# Patient Record
Sex: Female | Born: 1980 | Race: White | Hispanic: No | Marital: Single | Smoking: Current every day smoker
Health system: Southern US, Community
[De-identification: ages and names within clinical notes are randomized; demographics above are authoritative.]

## PROBLEM LIST (undated history)

## (undated) DIAGNOSIS — F4310 Post-traumatic stress disorder, unspecified: Secondary | ICD-10-CM

## (undated) DIAGNOSIS — F32A Depression, unspecified: Secondary | ICD-10-CM

## (undated) DIAGNOSIS — F419 Anxiety disorder, unspecified: Secondary | ICD-10-CM

## (undated) DIAGNOSIS — G43909 Migraine, unspecified, not intractable, without status migrainosus: Secondary | ICD-10-CM

## (undated) DIAGNOSIS — F329 Major depressive disorder, single episode, unspecified: Secondary | ICD-10-CM

## (undated) HISTORY — PX: BUNIONECTOMY: SHX129

## (undated) HISTORY — PX: CYST EXCISION: SHX5701

## (undated) HISTORY — DX: Depression, unspecified: F32.A

## (undated) HISTORY — DX: Anxiety disorder, unspecified: F41.9

## (undated) HISTORY — DX: Migraine, unspecified, not intractable, without status migrainosus: G43.909

## (undated) HISTORY — DX: Major depressive disorder, single episode, unspecified: F32.9

---

## 2004-11-12 ENCOUNTER — Emergency Department: Payer: Self-pay | Admitting: Emergency Medicine

## 2005-10-31 ENCOUNTER — Inpatient Hospital Stay: Payer: Self-pay | Admitting: Psychiatry

## 2013-09-07 ENCOUNTER — Ambulatory Visit: Payer: Self-pay | Admitting: Podiatrist

## 2013-09-15 ENCOUNTER — Ambulatory Visit (INDEPENDENT_AMBULATORY_CARE_PROVIDER_SITE_OTHER): Payer: BC Managed Care – PPO | Admitting: Podiatrist

## 2013-09-15 ENCOUNTER — Ambulatory Visit (INDEPENDENT_AMBULATORY_CARE_PROVIDER_SITE_OTHER): Payer: BC Managed Care – PPO

## 2013-09-15 ENCOUNTER — Encounter: Payer: Self-pay | Admitting: Podiatrist

## 2013-09-15 VITALS — BP 104/60 | HR 81 | Resp 16 | Ht 64.0 in | Wt 135.0 lb

## 2013-09-15 DIAGNOSIS — M79609 Pain in unspecified limb: Secondary | ICD-10-CM

## 2013-09-15 DIAGNOSIS — B351 Tinea unguium: Secondary | ICD-10-CM

## 2013-09-15 DIAGNOSIS — M79673 Pain in unspecified foot: Secondary | ICD-10-CM

## 2013-09-15 DIAGNOSIS — M2010 Hallux valgus (acquired), unspecified foot: Secondary | ICD-10-CM

## 2013-09-15 DIAGNOSIS — Z79899 Other long term (current) drug therapy: Secondary | ICD-10-CM

## 2013-09-15 NOTE — Patient Instructions (Signed)
Pre-Operative Instructions  Congratulations, you have decided to take an important step to improving your quality of life.  You can be assured that the doctors of [facility] will be with you every step of the way.  1. Plan to be at the surgery center/hospital at least 1 (one) hour prior to your scheduled time unless otherwise directed by the surgical center/hospital staff.  You must have a responsible adult accompany you, remain during the surgery and drive you home.  Make sure you have directions to the surgical center/hospital and know how to get there on time. 2. For hospital based surgery you will need to obtain a history and physical form from your family physician within 1 month prior to the date of surgery- we will give you a form for you primary physician.  3. We make every effort to accommodate the date you request for surgery.  There are however, times where surgery dates or times have to be moved.  We will contact you as soon as possible if a change in schedule is required.   4. No Aspirin/Ibuprofen for one week before surgery.  If you are on aspirin, any non-steroidal anti-inflammatory medications (Mobic, Aleve, Ibuprofen) you should stop taking it 7 days prior to your surgery.  You make take Tylenol  For pain prior to surgery.  5. Medications- If you are taking daily heart and blood pressure medications, seizure, reflux, allergy, asthma, anxiety, pain or diabetes medications, make sure the surgery center/hospital is aware before the day of surgery so they may notify you which medications to take or avoid the day of surgery. 6. No food or drink after midnight the night before surgery unless directed otherwise by surgical center/hospital staff. 7. No alcoholic beverages 24 hours prior to surgery.  No smoking 24 hours prior to or 24 hours after surgery. 8. Wear loose pants or shorts- loose enough to fit over bandages, boots, and casts. 9. No slip on shoes, sneakers are best. 10. Bring  your boot with you to the surgery center/hospital.  Also bring crutches or a walker if your physician has prescribed it for you.  If you do not have this equipment, it will be provided for you after surgery. 11. If you have not been contracted by the surgery center/hospital by the day before your surgery, call to confirm the date and time of your surgery. 12. Leave-time from work may vary depending on the type of surgery you have.  Appropriate arrangements should be made prior to surgery with your employer. 13. Prescriptions will be provided immediately following surgery by your doctor.  Have these filled as soon as possible after surgery and take the medication as directed. 14. Remove nail polish on the operative foot. 15. Wash the night before surgery.  The night before surgery wash the foot and leg well with the antibacterial soap provided and water paying special attention to beneath the toenails and in between the toes.  Rinse thoroughly with water and dry well with a towel.  Perform this wash unless told not to do so by your physician.  Enclosed: 1 Ice pack (please put in freezer the night before surgery)   1 Hibiclens skin cleaner   Pre-op Instructions  If you have any questions regarding the instructions, do not hesitate to call our office.  [place]: [address] [place], [place] [address] [phone]  [place]: [address] [place], [place] [address] [phone]  [place]: [address]  [place], [place] [address] [phone]  Dr. Richard   Tuchman DPM, Dr. Norman Regal DPM Dr. Richard Sikora DPM, Dr. M. Todd Hyatt DPM, Dr. Kathryn Egerton DPM 

## 2013-09-15 NOTE — Progress Notes (Signed)
Subjective:    Patient ID: Lori RifeSamantha G Roussin, female    DOB: 09/24/1980, 33 y.o.   MRN: [id number]  Patient presents today with chief complaint of a bunion on her right foot. She states has progressively gotten worse and it's uncomfortable and shoe gear. She also relates it's uncomfortable with dorsiflexion. She's tried putting padding between the great toe and second toe with minimal relief in symptoms. she states she's noticed it worsening over the past year. She's tried shoe gear changes as well.she also has a yellowish discoloration to her toenails of which she is concerned may be a fungus. She wants to know if this can be treated as well.   HPI Comments: N pain L right foot bunion D 1 year O slowly C worse A standing, walking T ice, gauze in shoes  Foot Pain      Review of Systems  Constitutional: Positive for activity change.  HENT: Negative.   Eyes: Negative.   Respiratory: Negative.   Cardiovascular: Negative.   Gastrointestinal: Negative.   Endocrine: Negative.   Genitourinary: Negative.   Musculoskeletal:       Difficulty walking  Skin: Negative.   Allergic/Immunologic: Negative.   Neurological: Negative.   Hematological: Negative.   Psychiatric/Behavioral: Negative.        Objective:   Physical Exam GENERAL APPEARANCE: Alert, conversant. Appropriately groomed. No acute distress.  VASCULAR: Pedal pulses palpable at 2/4 DP and PT bilateral.  Capillary refill time is immediate to all digits,  Proximal to distal cooling it warm to warm.  Digital hair growth is present bilateral  NEUROLOGIC: sensation is intact epicritically and protectively to 5.07 monofilament at 5/5 sites bilateral.  Light touch is intact bilateral, vibratory sensation intact bilateral, achilles tendon reflex is intact bilateral.  MUSCULOSKELETAL: acceptable muscle strength, tone and stability bilateral.  Deformity is noted right. Lateral deviation of the hallux is also seen right. Good range  of motion of the first metatarsophalangeal joint is noted but with pain and range of motion. Dorsiflexion 60 plantar flexion 20. Rectus appearance of foot otherwise is seen. DERMATOLOGIC:  toenails are yellowish brownish discoloration with subungual debris present especially bilateral first. Multiple signs of mycotic infection present. skin color, texture, and turger are otherwise within normal limits.    X.-Rays reveal a bunion deformity right. Mild increased IM angle is noted. See complete x-ray report.     Assessment & Plan:  Hallux abductovalgus deformity right. Mycotic toenail    Plan: Discussed conservative versus surgical options. Recommended a Austin bunion correction with possible Akin osteotomy of foot. The consent form was discussed and all three pages were signed and the patient's questions were encouraged and answered to the best of my ability. Risks of the surgery were discussed including but not limited to continued pain, infection, swelling, elevated toe, decreased range of motion,  Or suture or implant reaction. Preoperative instructions were also dispensed to the patient as well as a preoperative surgical pamphlet to go along with the instructions. Surgery will be scheduled at the patients convenience and patient [facility] on outpatient basis. If  any questions or concerns prior to her surgical date She is instructed to call. [facility]. I will notify her of the result. We will start her on Lamisil with receipt of fungal culture. Liver function panel and CBC forms were dispensed to the patient today and she'll be instructed to get that done before  starting the Lamisil.

## 2013-09-15 NOTE — Progress Notes (Deleted)
Bun right-- Lori Miller  Nail fungus-- today took sample-- will send lab form into lab and rx at receipt of nail sample

## 2013-09-22 ENCOUNTER — Ambulatory Visit: Payer: BC Managed Care – PPO | Admitting: Podiatrist

## 2013-10-12 ENCOUNTER — Encounter: Payer: Self-pay | Admitting: Podiatrist

## 2013-10-14 DIAGNOSIS — M79609 Pain in unspecified limb: Secondary | ICD-10-CM

## 2013-10-19 DIAGNOSIS — M2010 Hallux valgus (acquired), unspecified foot: Secondary | ICD-10-CM

## 2013-10-20 ENCOUNTER — Ambulatory Visit: Payer: Self-pay | Admitting: Podiatrist

## 2013-10-20 ENCOUNTER — Other Ambulatory Visit: Payer: Self-pay | Admitting: Podiatrist

## 2013-10-20 ENCOUNTER — Other Ambulatory Visit: Payer: Self-pay | Admitting: *Deleted

## 2013-10-20 MED ORDER — OXYCODONE-ACETAMINOPHEN 10-300 MG PO TABS: 1.0000 | ORAL_TABLET | ORAL | Status: DC | PRN

## 2013-10-21 ENCOUNTER — Other Ambulatory Visit: Payer: Self-pay | Admitting: *Deleted

## 2013-10-21 MED ORDER — OXYCODONE-ACETAMINOPHEN 10-325 MG PO TABS: ORAL_TABLET | ORAL | Status: DC

## 2013-10-21 NOTE — Telephone Encounter (Signed)
Pt called said pharmacy told her roxycodone was no longer being made. Per dr egerton have dr regal sign off on percocet 10/325. Percocet written for 10/325 mg #30 0 refills. Pt will come by office and pick up rx.

## 2013-10-27 ENCOUNTER — Ambulatory Visit (INDEPENDENT_AMBULATORY_CARE_PROVIDER_SITE_OTHER): Payer: BC Managed Care – PPO | Admitting: Podiatrist

## 2013-10-27 ENCOUNTER — Ambulatory Visit (INDEPENDENT_AMBULATORY_CARE_PROVIDER_SITE_OTHER): Payer: BC Managed Care – PPO

## 2013-10-27 VITALS — BP 108/61 | HR 68 | Resp 16 | Ht 64.0 in | Wt 135.0 lb

## 2013-10-27 DIAGNOSIS — Z9889 Other specified postprocedural states: Secondary | ICD-10-CM

## 2013-10-27 DIAGNOSIS — M2010 Hallux valgus (acquired), unspecified foot: Secondary | ICD-10-CM

## 2013-10-27 MED ORDER — OXYCODONE-ACETAMINOPHEN 10-325 MG PO TABS: ORAL_TABLET | ORAL | Status: DC

## 2013-10-27 NOTE — Patient Instructions (Signed)
Ok to start getting your foot wet-- wear your darco shoe now instead of your boot.  Start to exercise your big toe joint by moving your toe up and down with your fingers.

## 2013-10-27 NOTE — Progress Notes (Signed)
   Subjective: Patient presents today2 weeks status post foot surgery of the right foot.  Date of surgery 10/12/2013. The patient was unable to be seen last week as she had a stomach virus that kept her sick for the entire week. She states that she's been off her foot and wearing her boot as instructed. Today she denies nausea, vomiting, fevers, chills or night sweats.  Denies calf pain or tenderness to the operative side.  Objective:  Neurovascular status is intact with palpable pedal pulses DP and PT bilateral at 2+ out of 4. Neurological sensation is intact and unchanged as per prior to surgery. Excellent appearance of the postoperative foot is noted. Mild swelling at the metatarsophalangeal joint is present. Overall excellent appearance of the foot is seen postoperatively both clinically and radiographically. Range of motion of the first metatarsophalangeal joint is excellent.  Assessment: Status post bunion correction right foot date of surgery 10/12/2013  Plan:  Sutures are removed at today's visit. Darco she was dispensed as well as an anklet. Refilled her prescription for Percocet 10/325. I will see her back in 2 weeks for her one-month postop followup. Range of motion is excellent but she is encouraged to continue range of motion exercises. If she has any problems or concerns she will call.

## 2013-11-10 ENCOUNTER — Other Ambulatory Visit: Payer: Self-pay | Admitting: *Deleted

## 2013-11-10 MED ORDER — OXYCODONE-ACETAMINOPHEN 10-325 MG PO TABS: ORAL_TABLET | ORAL | Status: DC

## 2013-11-10 NOTE — Telephone Encounter (Signed)
Pt called requesting more pain medicine. Per dr Al Corpushyatt she can have #30 more. Percocet filled.

## 2013-11-16 NOTE — Progress Notes (Signed)
Dr Irving ShowsEgerton ordered Percocet 5/325mg  #40 1 or 2 tablets every 4 to 6 hours prn pain, and Keflex 500mg  #21 1 capsule tid.

## 2013-11-17 ENCOUNTER — Ambulatory Visit (INDEPENDENT_AMBULATORY_CARE_PROVIDER_SITE_OTHER): Payer: BC Managed Care – PPO

## 2013-11-17 ENCOUNTER — Ambulatory Visit (INDEPENDENT_AMBULATORY_CARE_PROVIDER_SITE_OTHER): Payer: BC Managed Care – PPO | Admitting: Podiatrist

## 2013-11-17 VITALS — Resp 16 | Ht 64.0 in | Wt 140.0 lb

## 2013-11-17 DIAGNOSIS — Z9889 Other specified postprocedural states: Secondary | ICD-10-CM

## 2013-11-17 DIAGNOSIS — M2010 Hallux valgus (acquired), unspecified foot: Secondary | ICD-10-CM

## 2013-11-17 MED ORDER — CYCLOBENZAPRINE HCL 5 MG PO TABS: 5.0000 mg | ORAL_TABLET | Freq: Every day | ORAL | Status: DC

## 2013-11-17 MED ORDER — OXYCODONE-ACETAMINOPHEN 5-325 MG PO TABS: 1.0000 | ORAL_TABLET | ORAL | Status: DC | PRN

## 2013-11-17 NOTE — Progress Notes (Signed)
Subjective: Lori Miller presents today for her postoperative followup status post right bunion surgery. She states that she's overall doing well but her dog ate her Darco shoe. She has been wearing her Croc shoes. She is 1 month postoperative. She denies any local or systemic signs of infection.  Objective: Excellent appearance of the foot is seen. Neurovascular status is intact. Range of motion is limited. Patient is guarding the first metatarsophalangeal joint. Incision line is excellent with full healing of the incision seen. No redness,  no drainage, no signs of infection are present. X-rays show a healing osteotomy of the right  Assessment: Status post bunion procedure right  Plan: Recommended continued range of motion exercises. Recommended that she start to practice getting into her work boot as she is supposed to go back to work in a week. If she is unable to return to work she will call and I will put her out for 2 more weeks to get the foot ready for work. She states she's cramping and I wrote her prescription for Flexeril. Also wrote a prescription for oxycodone to take at night for pain. She will be seen back in one month for her followup.

## 2013-11-24 ENCOUNTER — Telehealth: Payer: Self-pay | Admitting: *Deleted

## 2013-11-24 NOTE — Telephone Encounter (Signed)
Pt called said she can only put a shoe on for 5 - 10 minutes and she is supposed to be going back to work on 4.20.15. Told pt she could stay out of work longer and return on Monday 5.4.15. Pt stated something would need to be faxed to short term disability for extension. Told pt shelly would give her a call to find out more information on what needed to be faxed. Pt understood.

## 2013-11-30 ENCOUNTER — Telehealth: Payer: Self-pay | Admitting: *Deleted

## 2013-11-30 ENCOUNTER — Other Ambulatory Visit: Payer: Self-pay | Admitting: *Deleted

## 2013-11-30 NOTE — Telephone Encounter (Signed)
PT CALLED SAID SHE HAS TO GO BACK TO WORK ON Sunday 4.26.15. SHE WAS NOT APPROVED TO STAY OUT LONGER. SAYS SHE IS HAVING A LOT OF PAIN ON THE SIDE OF HER RT FOOT WHERE HER BUNION WAS AND HER FOOT IS SWOLLEN. SHE IS WANTING PAIN MEDICATION. SHE ALSO IS WANTING TO KNOW IF SHE HAS ANY RESTRICTIONS ON GOING BACK TO WORK.Marland Kitchen.WILL IT BE LIGHT DUTY OR REGULAR DUTY? HER JOB IS REQUESTING A NOTE STATING THIS INFO.

## 2013-11-30 NOTE — Telephone Encounter (Signed)
Open in error

## 2013-11-30 NOTE — Telephone Encounter (Signed)
Light duty is fine if she can have this restriction if her work will allow it-- its OK to refill her pain med if you can do it from there.  thanks

## 2013-12-01 ENCOUNTER — Other Ambulatory Visit: Payer: Self-pay | Admitting: *Deleted

## 2013-12-01 ENCOUNTER — Telehealth: Payer: Self-pay | Admitting: *Deleted

## 2013-12-01 MED ORDER — OXYCODONE-ACETAMINOPHEN 5-325 MG PO TABS: 1.0000 | ORAL_TABLET | ORAL | Status: DC | PRN

## 2013-12-01 NOTE — Telephone Encounter (Signed)
Pt called and said for light duty at work she wants to work for 8 hrs, no lifting over 10 lbs and she will need to elevate foot for 30 min out of each hour.

## 2013-12-01 NOTE — Telephone Encounter (Signed)
PT CALLED SAID SHE IS WANTING MORE PERCOCET SINCE SHE IS GOING BACK TO WORK ON 4.26.15. PER DR EGERTON PERCOCET 5-325 #40 0 REFILLS ONE TO TWO TABLETS BY MOUTH EVERY 4 HRS AS NEEDED FOR PAIN.

## 2013-12-02 ENCOUNTER — Encounter: Payer: Self-pay | Admitting: Podiatrist

## 2013-12-15 ENCOUNTER — Ambulatory Visit (INDEPENDENT_AMBULATORY_CARE_PROVIDER_SITE_OTHER): Payer: BC Managed Care – PPO

## 2013-12-15 ENCOUNTER — Encounter: Payer: Self-pay | Admitting: Podiatrist

## 2013-12-15 ENCOUNTER — Ambulatory Visit (INDEPENDENT_AMBULATORY_CARE_PROVIDER_SITE_OTHER): Payer: BC Managed Care – PPO | Admitting: Podiatrist

## 2013-12-15 VITALS — BP 106/59 | HR 68 | Resp 16

## 2013-12-15 DIAGNOSIS — Z9889 Other specified postprocedural states: Secondary | ICD-10-CM

## 2013-12-15 DIAGNOSIS — M2010 Hallux valgus (acquired), unspecified foot: Secondary | ICD-10-CM

## 2013-12-15 MED ORDER — OXYCODONE-ACETAMINOPHEN 5-325 MG PO TABS: 1.0000 | ORAL_TABLET | ORAL | Status: DC | PRN

## 2013-12-15 NOTE — Progress Notes (Signed)
Subjective: Lori Miller presents today for her postoperative followup status post right bunion surgery. She states that she's overall doing well but she thinks she may have gone back to work too soon and she has swelling on her right foot. She is 2 month postoperative. She denies any local or systemic signs of infection. She states pain when the toe is moved in a dorsiflexion direction.  Objective: Excellent appearance of the foot is seen. Neurovascular status is intact. Range of motion is limited and guarded. Incision line is healed however there is some scarring present. No redness, no drainage, no signs of infection are present. X-rays show continued healing osteotomy of the right foot  Assessment: Status post bunion procedure right   Plan: Recommended continued range of motion exercises. Also wrote for her work restriction which is to elevate after every hour of standing for 30 minutes. Also wrote a prescription for oxycodone to take at night for pain. She will be seen back in one month for her followup.

## 2013-12-23 ENCOUNTER — Telehealth: Payer: Self-pay | Admitting: *Deleted

## 2013-12-23 ENCOUNTER — Encounter: Payer: Self-pay | Admitting: Podiatrist

## 2013-12-23 NOTE — Telephone Encounter (Signed)
Called patient and left message. Patient had called this morning regarding her toenail results. Told her to call me in [place] office .

## 2013-12-23 NOTE — Telephone Encounter (Signed)
Spoke with Lori Miller went over her results and options . She stated that she had an appointment with dr Irving Showsegerton in a month and would discuss it with her at that time

## 2013-12-29 ENCOUNTER — Telehealth: Payer: Self-pay | Admitting: *Deleted

## 2013-12-29 ENCOUNTER — Other Ambulatory Visit: Payer: Self-pay | Admitting: Podiatrist

## 2013-12-29 MED ORDER — OXYCODONE-ACETAMINOPHEN 5-325 MG PO TABS: 1.0000 | ORAL_TABLET | ORAL | Status: DC | PRN

## 2013-12-29 NOTE — Telephone Encounter (Signed)
PT CALLED AND IS WANTING MORE PAIN MEDICATION.

## 2013-12-29 NOTE — Telephone Encounter (Signed)
CALLED AND L/M ON PTS VOICEMAIL LETTING HER KNOW PAIN RX WILL BE LEFT UP FRONT FOR HER AND PER DR EGERTON MAKE PAIN RX LAST AS LONG AS POSSIBLE. 3 MONTHS AFTER SURGERY CAN NO LONGER WRITE FOR ANY PAIN MEDICATION.

## 2013-12-29 NOTE — Telephone Encounter (Signed)
Ok done

## 2014-01-19 ENCOUNTER — Ambulatory Visit (INDEPENDENT_AMBULATORY_CARE_PROVIDER_SITE_OTHER): Payer: BC Managed Care – PPO

## 2014-01-19 ENCOUNTER — Ambulatory Visit (INDEPENDENT_AMBULATORY_CARE_PROVIDER_SITE_OTHER): Payer: BC Managed Care – PPO | Admitting: Podiatrist

## 2014-01-19 VITALS — BP 96/52 | HR 81 | Resp 16

## 2014-01-19 DIAGNOSIS — M21619 Bunion of unspecified foot: Secondary | ICD-10-CM

## 2014-01-19 DIAGNOSIS — M2010 Hallux valgus (acquired), unspecified foot: Secondary | ICD-10-CM

## 2014-01-19 DIAGNOSIS — Z9889 Other specified postprocedural states: Secondary | ICD-10-CM

## 2014-01-19 MED ORDER — TAVABOROLE 5 % EX SOLN: 1.0000 [drp] | CUTANEOUS | Status: DC

## 2014-01-19 NOTE — Patient Instructions (Signed)
Dr. Ria Comment scar shield/ Silicone Scar remover while at work  [facility] PM during the day.

## 2014-01-23 NOTE — Progress Notes (Signed)
Subjective: Lori Miller presents today for her 3 month postoperative followup status post right bunion surgery. She states that she's overall doing well.  She still has some pain at night after working all day but overall states the swelling has gone down a lot and overall her foot feels better.   Objective: Excellent appearance of the foot is seen. Neurovascular status is intact. Range of motion is limited and guarded. Incision line is healed however there is some scarring present. No redness, no drainage, no signs of infection are present.  X-rays show continued healing osteotomy of the right foot first metatarsal head  Assessment: Status post bunion procedure right   Plan: Recommended continued range of motion exercises. Also wrote a prescription for oxycodone to take at night for pain. She will be seen back as needed for followup.

## 2014-05-04 ENCOUNTER — Other Ambulatory Visit: Payer: Self-pay | Admitting: Podiatrist

## 2014-05-04 MED ORDER — TAVABOROLE 5 % EX SOLN: 1.0000 [drp] | CUTANEOUS | Status: DC

## 2014-09-26 ENCOUNTER — Ambulatory Visit: Payer: Self-pay | Admitting: Podiatry

## 2014-09-28 ENCOUNTER — Ambulatory Visit: Payer: Self-pay | Admitting: Podiatry

## 2014-11-20 ENCOUNTER — Other Ambulatory Visit
Admit: 2014-11-20 | Disposition: A | Discharge status: Home / Self Care | Payer: Self-pay | Attending: Family Medicine | Admitting: Family Medicine

## 2014-11-20 LAB — HCG, QUANTITATIVE, PREGNANCY

## 2015-03-09 ENCOUNTER — Emergency Department: Payer: Managed Care, Other (non HMO)

## 2015-03-09 ENCOUNTER — Emergency Department
Admission: EM | Admit: 2015-03-09 | Discharge: 2015-03-09 | Disposition: A | Discharge status: Home / Self Care | Payer: Managed Care, Other (non HMO) | Attending: Emergency Medicine | Admitting: Emergency Medicine

## 2015-03-09 ENCOUNTER — Encounter: Payer: Self-pay | Admitting: *Deleted

## 2015-03-09 DIAGNOSIS — Y998 Other external cause status: Secondary | ICD-10-CM | POA: Insufficient documentation

## 2015-03-09 DIAGNOSIS — Y9389 Activity, other specified: Secondary | ICD-10-CM | POA: Diagnosis not present

## 2015-03-09 DIAGNOSIS — S0511XA Contusion of eyeball and orbital tissues, right eye, initial encounter: Secondary | ICD-10-CM | POA: Insufficient documentation

## 2015-03-09 DIAGNOSIS — Y9289 Other specified places as the place of occurrence of the external cause: Secondary | ICD-10-CM | POA: Diagnosis not present

## 2015-03-09 DIAGNOSIS — S1083XA Contusion of other specified part of neck, initial encounter: Secondary | ICD-10-CM | POA: Insufficient documentation

## 2015-03-09 DIAGNOSIS — Z720 Tobacco use: Secondary | ICD-10-CM | POA: Diagnosis not present

## 2015-03-09 DIAGNOSIS — S0993XA Unspecified injury of face, initial encounter: Secondary | ICD-10-CM | POA: Diagnosis present

## 2015-03-09 DIAGNOSIS — S0083XA Contusion of other part of head, initial encounter: Secondary | ICD-10-CM

## 2015-03-09 MED ORDER — IBUPROFEN 800 MG PO TABS: 800.0000 mg | ORAL_TABLET | Freq: Three times a day (TID) | ORAL | Status: DC | PRN

## 2015-03-09 MED ORDER — HYDROCODONE-ACETAMINOPHEN 5-325 MG PO TABS: 1.0000 | ORAL_TABLET | ORAL | Status: DC | PRN

## 2015-03-09 MED ORDER — CYCLOBENZAPRINE HCL 5 MG PO TABS: 5.0000 mg | ORAL_TABLET | Freq: Three times a day (TID) | ORAL | Status: DC | PRN

## 2015-03-09 NOTE — Discharge Instructions (Signed)
Head Injury  You have received a head injury. It does not appear serious at this time. Headaches and vomiting are common following head injury. It should be easy to awaken from sleeping. Sometimes it is necessary for you to stay in the emergency department for a while for observation. Sometimes admission to the hospital may be needed. After injuries such as yours, most problems occur within the first 24 hours, but side effects may occur up to 7-10 days after the injury. It is important for you to carefully monitor your condition and contact your health care provider or seek immediate medical care if there is a change in your condition.  WHAT ARE THE TYPES OF HEAD INJURIES?  Head injuries can be as minor as a bump. Some head injuries can be more severe. More severe head injuries include:   A jarring injury to the brain (concussion).   A bruise of the brain (contusion). This mean there is bleeding in the brain that can cause swelling.   A cracked skull (skull fracture).   Bleeding in the brain that collects, clots, and forms a bump (hematoma).  WHAT CAUSES A HEAD INJURY?  A serious head injury is most likely to happen to someone who is in a car wreck and is not wearing a seat belt. Other causes of major head injuries include bicycle or motorcycle accidents, sports injuries, and falls.  HOW ARE HEAD INJURIES DIAGNOSED?  A complete history of the event leading to the injury and your current symptoms will be helpful in diagnosing head injuries. Many times, pictures of the brain, such as CT or MRI are needed to see the extent of the injury. Often, an overnight hospital stay is necessary for observation.   WHEN SHOULD I SEEK IMMEDIATE MEDICAL CARE?   You should get help right away if:   You have confusion or drowsiness.   You feel sick to your stomach (nauseous) or have continued, forceful vomiting.   You have dizziness or unsteadiness that is getting worse.   You have severe, continued headaches not relieved by  medicine. Only take over-the-counter or prescription medicines for pain, fever, or discomfort as directed by your health care provider.   You do not have normal function of the arms or legs or are unable to walk.   You notice changes in the black spots in the center of the colored part of your eye (pupil).   You have a clear or bloody fluid coming from your nose or ears.   You have a loss of vision.  During the next 24 hours after the injury, you must stay with someone who can watch you for the warning signs. This person should contact local emergency services (911 in the [place] if you have seizures, you become unconscious, or you are unable to wake up.  HOW CAN I PREVENT A HEAD INJURY IN THE FUTURE?  The most important factor for preventing major head injuries is avoiding motor vehicle accidents. To minimize the potential for damage to your head, it is crucial to wear seat belts while riding in motor vehicles. Wearing helmets while bike riding and playing collision sports (like football) is also helpful. Also, avoiding dangerous activities around the house will further help reduce your risk of head injury.   WHEN CAN I RETURN TO NORMAL ACTIVITIES AND ATHLETICS?  You should be reevaluated by your health care provider before returning to these activities. If you have any of the following symptoms, you should not return to activities   or contact sports until 1 week after the symptoms have stopped:   Persistent headache.   Dizziness or vertigo.   Poor attention and concentration.   Confusion.   Memory problems.   Nausea or vomiting.   Fatigue or tire easily.   Irritability.   Intolerant of bright lights or loud noises.   Anxiety or depression.   Disturbed sleep.  MAKE SURE YOU:    Understand these instructions.   Will watch your condition.   Will get help right away if you are not doing well or get worse.  Document Released: 07/28/2005 Document Revised: 08/02/2013 Document Reviewed:  04/04/2013  ExitCare Patient Information 2015 [employer]. This information is not intended to replace advice given to you by your health care provider. Make sure you discuss any questions you have with your health care provider.          Facial or Scalp Contusion  A facial or scalp contusion is a deep bruise on the face or head. Injuries to the face and head generally cause a lot of swelling, especially around the eyes. Contusions are the result of an injury that caused bleeding under the skin. The contusion may turn blue, purple, or yellow. Minor injuries will give you a painless contusion, but more severe contusions may stay painful and swollen for a few weeks.   CAUSES   A facial or scalp contusion is caused by a blunt injury or trauma to the face or head area.   SIGNS AND SYMPTOMS    Swelling of the injured area.    Discoloration of the injured area.    Tenderness, soreness, or pain in the injured area.   DIAGNOSIS   The diagnosis can be made by taking a medical history and doing a physical exam. An X-ray exam, CT scan, or MRI may be needed to determine if there are any associated injuries, such as broken bones (fractures).  TREATMENT   Often, the best treatment for a facial or scalp contusion is applying cold compresses to the injured area. Over-the-counter medicines may also be recommended for pain control.   HOME CARE INSTRUCTIONS    Only take over-the-counter or prescription medicines as directed by your health care provider.    Apply ice to the injured area.    Put ice in a plastic bag.    Place a towel between your skin and the bag.    Leave the ice on for 20 minutes, 2-3 times a day.   SEEK MEDICAL CARE IF:   You have bite problems.    You have pain with chewing.    You are concerned about facial defects.  SEEK IMMEDIATE MEDICAL CARE IF:   You have severe pain or a headache that is not relieved by medicine.    You have unusual sleepiness, confusion, or personality changes.     You throw up (vomit).    You have a persistent nosebleed.    You have double vision or blurred vision.    You have fluid drainage from your nose or ear.    You have difficulty walking or using your arms or legs.   MAKE SURE YOU:    Understand these instructions.   Will watch your condition.   Will get help right away if you are not doing well or get worse.  Document Released: 09/04/2004 Document Revised: 05/18/2013 Document Reviewed: 03/10/2013  Exit[employer] Patient Information 2015 [employer]. This information is not intended to replace advice given to you by your   health care provider. Make sure you discuss any questions you have with your health care provider.
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
## 2015-04-18 ENCOUNTER — Ambulatory Visit: Payer: 59

## 2015-04-18 ENCOUNTER — Encounter: Payer: Self-pay | Admitting: Emergency Medicine

## 2015-04-18 ENCOUNTER — Ambulatory Visit
Admission: EM | Admit: 2015-04-18 | Discharge: 2015-04-18 | Disposition: A | Discharge status: Home / Self Care | Payer: 59 | Attending: Family Medicine | Admitting: Family Medicine

## 2015-04-18 DIAGNOSIS — S40022A Contusion of left upper arm, initial encounter: Secondary | ICD-10-CM

## 2015-04-18 LAB — PREGNANCY, URINE: Preg Test, Ur: NEGATIVE

## 2015-04-18 MED ORDER — IBUPROFEN 600 MG PO TABS: 600.0000 mg | ORAL_TABLET | Freq: Four times a day (QID) | ORAL | Status: DC | PRN

## 2015-04-18 MED ORDER — KETOROLAC TROMETHAMINE 60 MG/2ML IM SOLN
60.0000 mg | Freq: Once | INTRAMUSCULAR | Status: AC
  Administered 2015-04-18: 60 mg via INTRAMUSCULAR

## 2015-04-18 MED ORDER — TETANUS-DIPHTH-ACELL PERTUSSIS 5-2.5-18.5 LF-MCG/0.5 IM SUSP
0.5000 mL | Freq: Once | INTRAMUSCULAR | Status: AC
  Administered 2015-04-18: 0.5 mL via INTRAMUSCULAR
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
## 2015-04-18 NOTE — Discharge Instructions (Signed)
Contusion °A contusion is a deep bruise. Contusions are the result of an injury that caused bleeding under the skin. The contusion may turn blue, purple, or yellow. Minor injuries will give you a painless contusion, but more severe contusions may stay painful and swollen for a few weeks.  °CAUSES  °A contusion is usually caused by a blow, trauma, or direct force to an area of the body. °SYMPTOMS  °· Swelling and redness of the injured area. °· Bruising of the injured area. °· Tenderness and soreness of the injured area. °· Pain. °DIAGNOSIS  °The diagnosis can be made by taking a history and physical exam. An X-ray, CT scan, or MRI may be needed to determine if there were any associated injuries, such as fractures. °TREATMENT  °Specific treatment will depend on what area of the body was injured. In general, the best treatment for a contusion is resting, icing, elevating, and applying cold compresses to the injured area. Over-the-counter medicines may also be recommended for pain control. Ask your caregiver what the best treatment is for your contusion. °HOME CARE INSTRUCTIONS  °· Put ice on the injured area. °¨ Put ice in a plastic bag. °¨ Place a towel between your skin and the bag. °¨ Leave the ice on for 15-20 minutes, 3-4 times a day, or as directed by your health care provider. °· Only take over-the-counter or prescription medicines for pain, discomfort, or fever as directed by your caregiver. Your caregiver may recommend avoiding anti-inflammatory medicines (aspirin, ibuprofen, and naproxen) for 48 hours because these medicines may increase bruising. °· Rest the injured area. °· If possible, elevate the injured area to reduce swelling. °SEEK IMMEDIATE MEDICAL CARE IF:  °· You have increased bruising or swelling. °· You have pain that is getting worse. °· Your swelling or pain is not relieved with medicines. °MAKE SURE YOU:  °· Understand these instructions. °· Will watch your condition. °· Will get help right  away if you are not doing well or get worse. °Document Released: 05/07/2005 Document Revised: 08/02/2013 Document Reviewed: 06/02/2011 °[employer]® Patient Information ©2015 [employer]. This information is not intended to replace advice given to you by your health care provider. Make sure you discuss any questions you have with your health care provider. ° °
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
## 2015-04-20 ENCOUNTER — Encounter: Payer: Self-pay | Admitting: Physician Assistant

## 2015-04-27 ENCOUNTER — Ambulatory Visit (INDEPENDENT_AMBULATORY_CARE_PROVIDER_SITE_OTHER): Payer: 59 | Admitting: Family Medicine

## 2015-04-27 ENCOUNTER — Other Ambulatory Visit (HOSPITAL_COMMUNITY)
Admission: RE | Admit: 2015-04-27 | Discharge: 2015-04-27 | Disposition: A | Discharge status: Home / Self Care | Payer: Managed Care, Other (non HMO) | Source: Ambulatory Visit | Attending: Family Medicine | Admitting: Family Medicine

## 2015-04-27 ENCOUNTER — Encounter (INDEPENDENT_AMBULATORY_CARE_PROVIDER_SITE_OTHER): Payer: Self-pay

## 2015-04-27 VITALS — BP 102/64 | HR 78 | Temp 99.0°F | Ht 64.0 in | Wt 121.8 lb

## 2015-04-27 DIAGNOSIS — F418 Other specified anxiety disorders: Secondary | ICD-10-CM

## 2015-04-27 DIAGNOSIS — G43809 Other migraine, not intractable, without status migrainosus: Secondary | ICD-10-CM

## 2015-04-27 DIAGNOSIS — N760 Acute vaginitis: Secondary | ICD-10-CM | POA: Insufficient documentation

## 2015-04-27 DIAGNOSIS — Z113 Encounter for screening for infections with a predominantly sexual mode of transmission: Secondary | ICD-10-CM | POA: Diagnosis present

## 2015-04-27 DIAGNOSIS — F419 Anxiety disorder, unspecified: Secondary | ICD-10-CM

## 2015-04-27 DIAGNOSIS — R109 Unspecified abdominal pain: Secondary | ICD-10-CM

## 2015-04-27 DIAGNOSIS — F329 Major depressive disorder, single episode, unspecified: Secondary | ICD-10-CM

## 2015-04-27 DIAGNOSIS — Z01419 Encounter for gynecological examination (general) (routine) without abnormal findings: Secondary | ICD-10-CM | POA: Insufficient documentation

## 2015-04-27 DIAGNOSIS — R634 Abnormal weight loss: Secondary | ICD-10-CM

## 2015-04-27 DIAGNOSIS — R1084 Generalized abdominal pain: Secondary | ICD-10-CM | POA: Diagnosis not present

## 2015-04-27 LAB — POCT URINALYSIS DIPSTICK
BILIRUBIN UA: NEGATIVE
GLUCOSE UA: NEGATIVE
Leukocytes, UA: NEGATIVE
NITRITE UA: NEGATIVE
PH UA: 6
RBC UA: NEGATIVE
Spec Grav, UA: >=1.03
Urobilinogen, UA: 0.2

## 2015-04-27 LAB — POCT URINE PREGNANCY: Preg Test, Ur: NEGATIVE

## 2015-04-27 MED ORDER — POLYETHYLENE GLYCOL 3350 17 GM/SCOOP PO POWD: 17.0000 g | Freq: Every day | ORAL | Status: DC | PRN

## 2015-04-27 MED ORDER — FLUOXETINE HCL 20 MG PO TABS: 20.0000 mg | ORAL_TABLET | Freq: Every day | ORAL | Status: DC

## 2015-04-27 MED ORDER — ALPRAZOLAM 0.5 MG PO TABS: 0.5000 mg | ORAL_TABLET | Freq: Every day | ORAL | Status: DC | PRN

## 2015-04-27 NOTE — Patient Instructions (Addendum)
Nice to meet you. Please stop the klonopin. We will change you to xanax. Please start the prozac for depression. We will obtain lab work today and call with the results. Please start the miralax for constipation. If you develop headache, numbness, weakness, nausea, vomiting, diarrhea, abdominal pain, fever, vision changes, vaginal bleeding, or feel poorly please seek medical attention. If you feel as though you are going to hurt yourself or others please seek medical attention.

## 2015-04-27 NOTE — Progress Notes (Signed)
Pre visit review using our clinic review tool, if applicable. No additional management support is needed unless otherwise documented below in the visit note. 

## 2015-04-30 ENCOUNTER — Other Ambulatory Visit: Payer: Self-pay

## 2015-04-30 ENCOUNTER — Other Ambulatory Visit: Payer: Self-pay | Admitting: Family Medicine

## 2015-05-01 ENCOUNTER — Encounter: Payer: Self-pay | Admitting: Family Medicine

## 2015-05-01 LAB — CYTOLOGY - PAP

## 2015-05-01 NOTE — Assessment & Plan Note (Addendum)
Patient notes loss of 20 lbs over the past year without trying to lose weight. Has no appetite. She has been depressed over that period of time. She has been anxious as well. She does have abdominal pain with possible mass noted on exam. Concern is for intraabdominal process with abdominal pain, possible mass, and weight loss. Most likely cause is her depression, though discussed CT abd/pelvis to evaluate this issue further, but patient declined this. Will check TSH, CMET, and CBC to evaluate for other causes. Patient reports she had a negative HIV test earlier this year and declines repeat testing today. Will treat patients depression. Will continue to monitor. Given return precautions.
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
## 2015-05-01 NOTE — Assessment & Plan Note (Signed)
Located in left mid abdomen. Possible mass palpated in right mid abdomen, though this could be stool given her history of constipation. Discussed possible causes. UA negative for infection. U preg negative. Normal pelvic exam makes GU cause less likely. Location makes appendix and gall bladder unlikely causes. Could be related to constipation given location. Worrisome aspect is the weight loss with out trying making intraabdominal pathology possible. Will check GC/chlamydia, yeast, BV, and trich. Will check stool cards. Will start on miralax for constipation. Discussed obtaining CT abd/pelvis given pain and possible mass, though patient declined this preferring to get blood work and monitor. Given return precautions.
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
## 2015-05-02 LAB — CERVICOVAGINAL ANCILLARY ONLY: Candida vaginitis: NEGATIVE

## 2015-05-03 ENCOUNTER — Other Ambulatory Visit: Payer: Self-pay | Admitting: Family Medicine

## 2015-05-03 MED ORDER — METRONIDAZOLE 500 MG PO TABS: 500.0000 mg | ORAL_TABLET | Freq: Two times a day (BID) | ORAL | Status: DC

## 2015-05-04 ENCOUNTER — Encounter: Payer: Self-pay | Admitting: Family Medicine

## 2015-05-04 ENCOUNTER — Encounter: Payer: Self-pay | Admitting: Emergency Medicine

## 2015-05-04 ENCOUNTER — Ambulatory Visit (INDEPENDENT_AMBULATORY_CARE_PROVIDER_SITE_OTHER): Payer: 59 | Admitting: Family Medicine

## 2015-05-04 ENCOUNTER — Emergency Department: Payer: 59

## 2015-05-04 ENCOUNTER — Emergency Department
Admission: EM | Admit: 2015-05-04 | Discharge: 2015-05-04 | Discharge status: Against Medical Advice | Payer: 59 | Attending: Emergency Medicine | Admitting: Emergency Medicine

## 2015-05-04 ENCOUNTER — Encounter (INDEPENDENT_AMBULATORY_CARE_PROVIDER_SITE_OTHER): Payer: Self-pay

## 2015-05-04 ENCOUNTER — Telehealth: Payer: Self-pay | Admitting: Family Medicine

## 2015-05-04 VITALS — BP 114/72 | HR 67 | Temp 98.4°F | Ht 64.0 in

## 2015-05-04 DIAGNOSIS — F418 Other specified anxiety disorders: Secondary | ICD-10-CM | POA: Diagnosis not present

## 2015-05-04 DIAGNOSIS — R1013 Epigastric pain: Secondary | ICD-10-CM

## 2015-05-04 DIAGNOSIS — F32A Depression, unspecified: Secondary | ICD-10-CM

## 2015-05-04 DIAGNOSIS — Z79899 Other long term (current) drug therapy: Secondary | ICD-10-CM | POA: Diagnosis not present

## 2015-05-04 DIAGNOSIS — Z720 Tobacco use: Secondary | ICD-10-CM | POA: Insufficient documentation

## 2015-05-04 DIAGNOSIS — R112 Nausea with vomiting, unspecified: Secondary | ICD-10-CM | POA: Insufficient documentation

## 2015-05-04 DIAGNOSIS — F419 Anxiety disorder, unspecified: Secondary | ICD-10-CM

## 2015-05-04 DIAGNOSIS — R109 Unspecified abdominal pain: Secondary | ICD-10-CM | POA: Diagnosis present

## 2015-05-04 DIAGNOSIS — F329 Major depressive disorder, single episode, unspecified: Secondary | ICD-10-CM

## 2015-05-04 LAB — COMPREHENSIVE METABOLIC PANEL
ALK PHOS: 40 U/L (ref 38–126)
ALT: 14 U/L (ref 14–54)
AST: 25 U/L (ref 15–41)
Albumin: 5.5 g/dL — ABNORMAL HIGH (ref 3.5–5.0)
Anion gap: 14 (ref 5–15)
BILIRUBIN TOTAL: 0.7 mg/dL (ref 0.3–1.2)
BUN: 11 mg/dL (ref 6–20)
CO2: 22 mmol/L (ref 22–32)
CREATININE: 0.92 mg/dL (ref 0.44–1.00)
Calcium: 10.7 mg/dL — ABNORMAL HIGH (ref 8.9–10.3)
Chloride: 102 mmol/L (ref 101–111)
GFR calc Af Amer: >60 mL/min (ref >60)
GFR calc non Af Amer: >60 mL/min (ref >60)
GLUCOSE: 112 mg/dL — AB (ref 65–99)
Potassium: 3.8 mmol/L (ref 3.5–5.1)
Sodium: 138 mmol/L (ref 135–145)
TOTAL PROTEIN: 8.3 g/dL — AB (ref 6.5–8.1)

## 2015-05-04 LAB — CBC WITH DIFFERENTIAL/PLATELET
BASOS ABS: 0.1 10*3/uL (ref 0–0.1)
Basophils Relative: 1 %
Eosinophils Absolute: 0 10*3/uL (ref 0–0.7)
Eosinophils Relative: 0 %
HCT: 44.1 % (ref 35.0–47.0)
HEMOGLOBIN: 14.6 g/dL (ref 12.0–16.0)
LYMPHS PCT: 22 %
Lymphs Abs: 2.7 10*3/uL (ref 1.0–3.6)
MCH: 30 pg (ref 26.0–34.0)
MCHC: 33 g/dL (ref 32.0–36.0)
MCV: 90.7 fL (ref 80.0–100.0)
MONO ABS: 0.4 10*3/uL (ref 0.2–0.9)
Monocytes Relative: 3 %
Neutro Abs: 9.2 10*3/uL — ABNORMAL HIGH (ref 1.4–6.5)
Neutrophils Relative %: 74 %
Platelets: 284 10*3/uL (ref 150–440)
RBC: 4.87 MIL/uL (ref 3.80–5.20)
RDW: 15.2 % — ABNORMAL HIGH (ref 11.5–14.5)
WBC: 12.5 10*3/uL — AB (ref 3.6–11.0)

## 2015-05-04 LAB — LIPASE, BLOOD: Lipase: 26 U/L (ref 22–51)

## 2015-05-04 MED ORDER — METOCLOPRAMIDE HCL 5 MG/ML IJ SOLN
10.0000 mg | Freq: Once | INTRAMUSCULAR | Status: AC
  Administered 2015-05-04: 10 mg via INTRAVENOUS

## 2015-05-04 MED ORDER — ONDANSETRON HCL 4 MG/2ML IJ SOLN
4.0000 mg | Freq: Once | INTRAMUSCULAR | Status: AC
  Administered 2015-05-04: 4 mg via INTRAVENOUS

## 2015-05-04 MED ORDER — METOCLOPRAMIDE HCL 5 MG/ML IJ SOLN
INTRAMUSCULAR | Status: AC
  Administered 2015-05-04: 10 mg via INTRAVENOUS
  Filled 2015-05-04: qty 2

## 2015-05-04 MED ORDER — MORPHINE SULFATE (PF) 4 MG/ML IV SOLN
INTRAVENOUS | Status: AC
  Administered 2015-05-04: 4 mg via INTRAVENOUS
  Filled 2015-05-04: qty 1

## 2015-05-04 MED ORDER — ONDANSETRON HCL 4 MG/2ML IJ SOLN
INTRAMUSCULAR | Status: AC
  Administered 2015-05-04: 4 mg via INTRAVENOUS
  Filled 2015-05-04: qty 2

## 2015-05-04 MED ORDER — ALPRAZOLAM 0.5 MG PO TABS: 0.5000 mg | ORAL_TABLET | Freq: Two times a day (BID) | ORAL | Status: DC | PRN

## 2015-05-04 MED ORDER — MORPHINE SULFATE (PF) 4 MG/ML IV SOLN
4.0000 mg | Freq: Once | INTRAVENOUS | Status: AC
  Administered 2015-05-04: 4 mg via INTRAVENOUS

## 2015-05-04 NOTE — ED Notes (Signed)
Pt requesting IV fluids, "states I know I'm dehydrated"; MD informed. No new orders at this time.

## 2015-05-04 NOTE — Telephone Encounter (Signed)
Called patient as I noticed she had not gone to the ED yet following her visit when I advised EMS transport which she refused and advised her to immediately go to the ED for evaluation. She reports she went home and took a shower. I advised her that she go to the ED immediately for evaluation. She voiced understanding.

## 2015-05-04 NOTE — ED Notes (Signed)
Pt left AMA with IV in place; MD and charge RN informed, pt looked for in lobby but not found. Attempted to call patient to return for IV removal, no answer.

## 2015-05-04 NOTE — ED Notes (Signed)
Pt c/o leg cramps after IV push of reglan. Pt denies any leg cramps at this time.

## 2015-05-04 NOTE — Assessment & Plan Note (Addendum)
Stable at this time. No SI or HI. Will continue prozac. Needs refill on xanax. Will refill xanax for BID prn dosing. Will call this in to the pharmacy. Given return precautions.

## 2015-05-04 NOTE — ED Notes (Signed)
Pt left AMA with IV; charge RN informed.

## 2015-05-04 NOTE — Patient Instructions (Signed)
Nice to see you. Please go to the ED immediately for evaluation of your abdominal pain.

## 2015-05-04 NOTE — Progress Notes (Signed)
Pre visit review using our clinic review tool, if applicable. No additional management support is needed unless otherwise documented below in the visit note. 

## 2015-05-04 NOTE — Assessment & Plan Note (Addendum)
Patient with acute onset of abdominal pain this morning that is different than prior pain with vomiting and diarrhea. Has guarding and rebound on exam. Vital signs stable. Concern is for acute abdomen. Discussed this with patient and advised evaluation in ED and transport via EMS. Patient agreed with ED evaluation, though declined EMS transport. Advised of risks of transporting herself and she still declined. AMA paperwork signed by patient. Will defer work up of this acute issue to the ED. Differential includes acute abdomen, pancreatitis, reaction to flagyl, gastric ulcer, cholecystitis, and viral illness. Given acute pain with guarding and rebound patient will be evaluated in the ED. CMA contacted ED charge nurse to advise patient was on her way.

## 2015-05-04 NOTE — ED Notes (Signed)
Pt to ED with c/o epigastric pain and n,v since this am, states she started taking flagyl yesterday for a "bacterial infection", pt moaning with pain in triage and dry heaving

## 2015-05-04 NOTE — ED Provider Notes (Signed)
[facility] Emergency Department Provider Note    ____________________________________________  Time seen: 2015  I have reviewed the triage vital signs and the nursing notes.   HISTORY  Chief Complaint Abdominal Pain   History limited by: Not Limited   HPI Lori Miller is a 34 y.o. female who presents to the emergency department today because of concerns for epigastric pain and nausea and vomiting. She states that the symptoms started this morning. She states the pain is severe. It has been constant throughout the day. It was worse with eating. The patient states that she did start Flagyl yesterday and did take her dose of Flagyl today on an empty stomach. The patient denies any recent fevers. Denies any history of gallbladder disease.     Past Medical History  Diagnosis Date  . Migraine   . Depression   . Anxiety     Patient Active Problem List   Diagnosis Date Noted  . Anxiety and depression 05/01/2015  . Migraine headache 05/01/2015  . Abdominal pain 05/01/2015  . Loss of weight 05/01/2015    Past Surgical History  Procedure Laterality Date  . Cyst excision      Current Outpatient Rx  Name  Route  Sig  Dispense  Refill  . ALPRAZolam (XANAX) 0.5 MG tablet   Oral   Take 1 tablet (0.5 mg total) by mouth 2 (two) times daily as needed for anxiety.   60 tablet   0   . CARAFATE 1 GM/10ML suspension               . famotidine (PEPCID) 40 MG tablet               . FLUoxetine (PROZAC) 20 MG tablet   Oral   Take 1 tablet (20 mg total) by mouth daily.   30 tablet   3   . ibuprofen (ADVIL,MOTRIN) 600 MG tablet   Oral   Take 1 tablet (600 mg total) by mouth every 6 (six) hours as needed.   30 tablet   0   . metroNIDAZOLE (FLAGYL) 500 MG tablet   Oral   Take 1 tablet (500 mg total) by mouth 2 (two) times daily.   14 tablet   0   . polyethylene glycol powder (GLYCOLAX/MIRALAX) powder   Oral   Take 17 g by mouth  daily as needed for mild constipation.   3350 g   0   . Tavaborole (KERYDIN) 5 % SOLN   Apply externally   Apply 1 drop topically 1 day or 1 dose. Apply 1 drop to the toenail daily.   1 Bottle   5     Allergies Review of patient's allergies indicates no known allergies.  No family history on file.  Social History Social History  Substance Use Topics  . Smoking status: Current Every Day Smoker  . Smokeless tobacco: None  . Alcohol Use: No    Review of Systems  Constitutional: Negative for fever. Cardiovascular: Negative for chest pain. Respiratory: Negative for shortness of breath. Gastrointestinal: Positive for abdominal pain, vomiting. Genitourinary: Negative for dysuria. Musculoskeletal: Negative for back pain. Skin: Negative for rash. Neurological: Negative for headaches, focal weakness or numbness.   10-point ROS otherwise negative.  ____________________________________________   PHYSICAL EXAM:  VITAL SIGNS: ED Triage Vitals  Enc Vitals Group     BP 05/04/15 1825 123/108 mmHg     Pulse Rate 05/04/15 1825 63     Resp 05/04/15 18[facility]25 22  Temp 05/04/15 1825 98.6 F (37 C)     Temp Source 05/04/15 1825 Oral     SpO2 05/04/15 1825 100 %     Weight 05/04/15 1825 118 lb (53.524 kg)     Height 05/04/15 1825  (1.626 m)     Head Cir --      Peak Flow --      Pain Score 05/04/15 1826 10   Constitutional: Alert and oriented. Appears uncomfortable. Eyes: Conjunctivae are normal. PERRL. Normal extraocular movements. ENT   Head: Normocephalic and atraumatic.   Nose: No congestion/rhinnorhea.   Mouth/Throat: Mucous membranes are moist.   Neck: No stridor. Hematological/Lymphatic/Immunilogical: No cervical lymphadenopathy. Cardiovascular: Normal rate, regular rhythm.  No murmurs, rubs, or gallops. Respiratory: Normal respiratory effort without tachypnea nor retractions. Breath sounds are clear and equal bilaterally. No  wheezes/rales/rhonchi. Gastrointestinal: Soft. Tender to palpation in the epigastric region. No rebound. No guarding. Genitourinary: Deferred Musculoskeletal: Normal range of motion in all extremities. No joint effusions.  No lower extremity tenderness nor edema. Neurologic:  Normal speech and language. No gross focal neurologic deficits are appreciated. Speech is normal.  Skin:  Skin is warm, dry and intact. No rash noted. Psychiatric: Mood and affect are normal. Speech and behavior are normal. Patient exhibits appropriate insight and judgment.  ____________________________________________    LABS (pertinent positives/negatives)  Labs Reviewed  CBC WITH DIFFERENTIAL/PLATELET - Abnormal; Notable for the following:    WBC 12.5 (*)    RDW 15.2 (*)    Neutro Abs 9.2 (*)    All other components within normal limits  COMPREHENSIVE METABOLIC PANEL - Abnormal; Notable for the following:    Glucose, Bld 112 (*)    Calcium 10.7 (*)    Total Protein 8.3 (*)    Albumin 5.5 (*)    All other components within normal limits  LIPASE, BLOOD  URINALYSIS COMPLETEWITH MICROSCOPIC ([facility] ONLY)     ____________________________________________   EKG  I, Phineas Semen, attending physician, personally viewed and interpreted this EKG  EKG Time: 1827 Rate: 64 Rhythm: sinus rhythm with arrythmia Axis: normal Intervals: qtc 418 QRS: narrow ST changes: no st elevation  ____________________________________________    RADIOLOGY  None  ____________________________________________   PROCEDURES  Procedure(s) performed: None  Critical Care performed: No  ____________________________________________   INITIAL IMPRESSION / ASSESSMENT AND PLAN / ED COURSE  Pertinent labs & imaging results that were available during my care of the patient were reviewed by me and considered in my medical decision making (see chart for details).  Patient presented to the emergency department today with  concerns for epigastric pain, nausea and vomiting. Of note patient did start Flagyl recently and I think this could be a reason for the abdominal discomfort. However given the location of the pain and the fact that it is worse with food I did order an ultrasound to evaluate for gallbladder disease. The patient unfortunately eloped from the emergency department prior to the ultrasound results.  ____________________________________________   FINAL CLINICAL IMPRESSION(S) / ED DIAGNOSES  Final diagnoses:  Epigastric abdominal pain     Phineas Semen, MD 05/04/15 2147

## 2015-05-04 NOTE — Progress Notes (Addendum)
Patient ID: Lori Miller, female   DOB: 07-May-1981, 34 y.o.   MRN: [id number]  Lori Alar, MD Phone: [phone]  Lori Miller is a 34 y.o. female who presents today for f/u.  Abdominal pain: patient notes onset of vomiting, diarrhea, and different abdominal pain. She notes onset this morning. Pain is epigastric and severe. Notes it feels like knots. She has NBNB vomiting. Has diarrhea. No blood in stool. Has had chills. Has not checked her temp. Notes she took flagyl yesterday and today. No issues yesterday. No food changes or alcohol intake.   Anxiety: notes this is still present. She feels the xanax has helped her. Has taken it 2x/day the past 2 days. States she threw the klonopin out. No SI or HI. Has started the prozac.   PMH: smoker.    ROS see HPI  Objective  Physical Exam Filed Vitals:   05/04/15 1549  BP: 114/72  Pulse: 67  Temp: 98.4 F (36.9 C)    Physical Exam  Constitutional:  Tired appearing, appears uncomfortable  HENT:  Head: Normocephalic and atraumatic.  Cardiovascular: Normal rate, normal heart sounds and intact distal pulses.  Exam reveals no gallop and no friction rub.   No murmur heard. Pulmonary/Chest: Effort normal and breath sounds normal. No respiratory distress. She has no wheezes. She has no rales.  Abdominal: Soft. Bowel sounds are normal. She exhibits no distension. There is tenderness (epigastric). There is rebound (throughout upper abdomen) and guarding (throughout abdomen).  Skin: Skin is warm and dry. She is not diaphoretic.     Assessment/Plan: Please see individual problem list.  Abdominal pain Patient with acute onset of abdominal pain this morning that is different than prior pain with vomiting and diarrhea. Has guarding and rebound on exam. Vital signs stable. Concern is for acute abdomen. Discussed this with patient and advised evaluation in ED and transport via EMS. Patient agreed with ED evaluation, though declined  EMS transport. Advised of risks of transporting herself and she still declined. AMA paperwork signed by patient. Will defer work up of this acute issue to the ED. Differential includes acute abdomen, pancreatitis, reaction to flagyl, gastric ulcer, cholecystitis, and viral illness. Given acute pain with guarding and rebound patient will be evaluated in the ED. CMA contacted ED charge nurse to advise patient was on her way.   Anxiety and depression Stable at this time. No SI or HI. Will continue prozac. Needs refill on xanax. Will refill xanax for BID prn dosing. Will call this in to the pharmacy. Given return precautions.    Lori Miller

## 2015-05-05 ENCOUNTER — Encounter: Payer: Self-pay | Admitting: *Deleted

## 2015-05-05 ENCOUNTER — Emergency Department: Payer: 59

## 2015-05-05 ENCOUNTER — Emergency Department
Admission: EM | Admit: 2015-05-05 | Discharge: 2015-05-06 | Disposition: A | Discharge status: Home / Self Care | Payer: 59 | Attending: Emergency Medicine | Admitting: Emergency Medicine

## 2015-05-05 DIAGNOSIS — R109 Unspecified abdominal pain: Secondary | ICD-10-CM | POA: Diagnosis present

## 2015-05-05 DIAGNOSIS — R1013 Epigastric pain: Secondary | ICD-10-CM | POA: Insufficient documentation

## 2015-05-05 DIAGNOSIS — Z792 Long term (current) use of antibiotics: Secondary | ICD-10-CM | POA: Diagnosis not present

## 2015-05-05 DIAGNOSIS — Z720 Tobacco use: Secondary | ICD-10-CM | POA: Diagnosis not present

## 2015-05-05 DIAGNOSIS — Z79899 Other long term (current) drug therapy: Secondary | ICD-10-CM | POA: Insufficient documentation

## 2015-05-05 LAB — CBC WITH DIFFERENTIAL/PLATELET
BASOS PCT: 1 %
Basophils Absolute: 0.1 10*3/uL (ref 0–0.1)
EOS ABS: 0 10*3/uL (ref 0–0.7)
EOS PCT: 0 %
HCT: 40.8 % (ref 35.0–47.0)
Hemoglobin: 13.6 g/dL (ref 12.0–16.0)
LYMPHS ABS: 2.8 10*3/uL (ref 1.0–3.6)
Lymphocytes Relative: 23 %
MCH: 30.2 pg (ref 26.0–34.0)
MCHC: 33.3 g/dL (ref 32.0–36.0)
MCV: 90.5 fL (ref 80.0–100.0)
MONO ABS: 0.5 10*3/uL (ref 0.2–0.9)
MONOS PCT: 4 %
NEUTROS PCT: 72 %
Neutro Abs: 8.6 10*3/uL — ABNORMAL HIGH (ref 1.4–6.5)
PLATELETS: 237 10*3/uL (ref 150–440)
RBC: 4.5 MIL/uL (ref 3.80–5.20)
RDW: 15.3 % — AB (ref 11.5–14.5)
WBC: 12.1 10*3/uL — ABNORMAL HIGH (ref 3.6–11.0)

## 2015-05-05 LAB — COMPREHENSIVE METABOLIC PANEL
ALBUMIN: 4.8 g/dL (ref 3.5–5.0)
ALK PHOS: 33 U/L — AB (ref 38–126)
ALT: 12 U/L — ABNORMAL LOW (ref 14–54)
ANION GAP: 5 (ref 5–15)
AST: 19 U/L (ref 15–41)
BUN: 14 mg/dL (ref 6–20)
CALCIUM: 9.4 mg/dL (ref 8.9–10.3)
CHLORIDE: 108 mmol/L (ref 101–111)
CO2: 29 mmol/L (ref 22–32)
Creatinine, Ser: 0.95 mg/dL (ref 0.44–1.00)
GFR calc non Af Amer: >60 mL/min (ref >60)
GLUCOSE: 124 mg/dL — AB (ref 65–99)
POTASSIUM: 4 mmol/L (ref 3.5–5.1)
SODIUM: 142 mmol/L (ref 135–145)
Total Bilirubin: 0.8 mg/dL (ref 0.3–1.2)
Total Protein: 7.5 g/dL (ref 6.5–8.1)

## 2015-05-05 LAB — LIPASE, BLOOD: Lipase: 28 U/L (ref 22–51)

## 2015-05-05 MED ORDER — ONDANSETRON HCL 4 MG PO TABS
8.0000 mg | ORAL_TABLET | Freq: Once | ORAL | Status: AC
  Administered 2015-05-05: 8 mg via ORAL
  Filled 2015-05-05: qty 2

## 2015-05-05 MED ORDER — GI COCKTAIL ~~LOC~~
30.0000 mL | Freq: Once | ORAL | Status: AC
  Administered 2015-05-05: 30 mL via ORAL

## 2015-05-05 MED ORDER — GI COCKTAIL ~~LOC~~
ORAL | Status: AC
  Filled 2015-05-05: qty 30

## 2015-05-05 MED ORDER — ONDANSETRON HCL 4 MG PO TABS: ORAL_TABLET | ORAL | Status: DC

## 2015-05-05 MED ORDER — KETOROLAC TROMETHAMINE 60 MG/2ML IM SOLN
60.0000 mg | Freq: Once | INTRAMUSCULAR | Status: AC
  Administered 2015-05-05: 60 mg via INTRAMUSCULAR
  Filled 2015-05-05: qty 2

## 2015-05-05 MED ORDER — GI COCKTAIL ~~LOC~~: 30.0000 mL | Freq: Once | ORAL | Status: DC

## 2015-05-05 NOTE — ED Provider Notes (Signed)
-----------------------------------------   10:00 PM on 05/05/2015 -----------------------------------------   Blood pressure 126/88, pulse 65, temperature 98.2 F (36.8 C), temperature source Oral, resp. rate 20, height  (1.626 m), weight 116 lb (52.617 kg), last menstrual period 04/23/2015, SpO2 100 %.  Assuming care from Dr. Derrill Kay.  In short, Lori Miller is a 34 y.o. female with a chief complaint of Abdominal Pain .  Refer to the original H&P for additional details.  The current plan of care is to follow-up the abdominal ultrasound, reassess, and discharge if appropriate.  ----------------------------------------- 11:43 PM on 05/05/2015 -----------------------------------------  US Abdomen Limited Ruq  05/05/2015   CLINICAL DATA:  Epigastric pain, nausea and vomiting since yesterday morning. Currently taking Flagyl for bacterial infection.  EXAM: US ABDOMEN LIMITED - RIGHT UPPER QUADRANT  COMPARISON:  None.  FINDINGS: Gallbladder:  Gallbladder is contracted, likely physiologic in a nonfasting patient although gallbladder dysfunction not excluded. Mild gallbladder wall thickening is likely related to contraction. No stones identified. Murphy's sign is negative.  Common bile duct:  Diameter: 3 mm, normal  Liver:  No focal lesion identified. Within normal limits in parenchymal echogenicity.  IMPRESSION: Gallbladder is contracted and thick-walled, likely physiologic although gallbladder dysfunction not entirely excluded. Consider follow-up study with patient fasting. No gallstones and Murphy's sign is negative. No bile duct dilatation.   Electronically Signed   By: Burman Nieves M.D.   On: 05/05/2015 21:53    The patient feels very strongly that she is ready to go.  I updated her and give her the news about the ultrasound recommended she follow up with her regular doctor at the next available opportunity I am giving her a GI cocktail but she does not want this to hold up her  discharge.  I will get her paperwork ready.  Her vital signs have remained stable and her overall assessment is reassuring.  Loleta Rose, MD 05/05/15 [phone]

## 2015-05-05 NOTE — Discharge Instructions (Signed)
You have been seen in the Emergency Department (ED) for abdominal pain.  Your evaluation did not identify a clear cause of your symptoms but was generally reassuring. ° °Please follow up as instructed above regarding today’s emergent visit and the symptoms that are bothering you. ° °Return to the ED if your abdominal pain worsens or fails to improve, you develop bloody vomiting, bloody diarrhea, you are unable to tolerate fluids due to vomiting, fever greater than 101, or other symptoms that concern you. ° ° °Abdominal Pain °Many things can cause abdominal pain. Usually, abdominal pain is not caused by a disease and will improve without treatment. It can often be observed and treated at home. Your health care provider will do a physical exam and possibly order blood tests and X-rays to help determine the seriousness of your pain. However, in many cases, more time must pass before a clear cause of the pain can be found. Before that point, your health care provider may not know if you need more testing or further treatment. °HOME CARE INSTRUCTIONS  °Monitor your abdominal pain for any changes. The following actions may help to alleviate any discomfort you are experiencing: °· Only take over-the-counter or prescription medicines as directed by your health care provider. °· Do not take laxatives unless directed to do so by your health care provider. °· Try a clear liquid diet (broth, tea, or water) as directed by your health care provider. Slowly move to a bland diet as tolerated. °SEEK MEDICAL CARE IF: °· You have unexplained abdominal pain. °· You have abdominal pain associated with nausea or diarrhea. °· You have pain when you urinate or have a bowel movement. °· You experience abdominal pain that wakes you in the night. °· You have abdominal pain that is worsened or improved by eating food. °· You have abdominal pain that is worsened with eating fatty foods. °· You have a fever. °SEEK IMMEDIATE MEDICAL CARE IF:   °· Your pain does not go away within 2 hours. °· You keep throwing up (vomiting). °· Your pain is felt only in portions of the abdomen, such as the right side or the left lower portion of the abdomen. °· You pass bloody or black tarry stools. °MAKE SURE YOU: °· Understand these instructions.   °· Will watch your condition.   °· Will get help right away if you are not doing well or get worse.   °Document Released: 05/07/2005 Document Revised: 08/02/2013 Document Reviewed: 04/06/2013 °[employer]® Patient Information ©2015 [employer]. This information is not intended to replace advice given to you by your health care provider. Make sure you discuss any questions you have with your health care provider. ° °

## 2015-05-05 NOTE — ED Provider Notes (Signed)
[facility] Emergency Department Provider Note    ____________________________________________  Time seen: 2035  I have reviewed the triage vital signs and the nursing notes.   HISTORY  Chief Complaint Abdominal Pain   History limited by: Not Limited   HPI Lori Miller is a 34 y.o. female . Presents to the emergency department today with continued epigastric pain. I did evaluate the patient in the emergency department yesterday for the same pain. She eloped from the emergency department yesterday before and ultrasound be performed. She did leave with her IV in place. She states that she was feeling a little bit better after she left the ED however when she ate again today she had re-onset of the pain. She still describes it in the epigastric area. She states it is still severe. She states she left yesterday because she got anxious. She denies any interim fevers.     Past Medical History  Diagnosis Date  . Migraine   . Depression   . Anxiety     Patient Active Problem List   Diagnosis Date Noted  . Anxiety and depression 05/01/2015  . Migraine headache 05/01/2015  . Abdominal pain 05/01/2015  . Loss of weight 05/01/2015    Past Surgical History  Procedure Laterality Date  . Cyst excision      Current Outpatient Rx  Name  Route  Sig  Dispense  Refill  . ALPRAZolam (XANAX) 0.5 MG tablet   Oral   Take 1 tablet (0.5 mg total) by mouth 2 (two) times daily as needed for anxiety.   60 tablet   0   . CARAFATE 1 GM/10ML suspension               . famotidine (PEPCID) 40 MG tablet               . FLUoxetine (PROZAC) 20 MG tablet   Oral   Take 1 tablet (20 mg total) by mouth daily.   30 tablet   3   . ibuprofen (ADVIL,MOTRIN) 600 MG tablet   Oral   Take 1 tablet (600 mg total) by mouth every 6 (six) hours as needed.   30 tablet   0   . metroNIDAZOLE (FLAGYL) 500 MG tablet   Oral   Take 1 tablet (500 mg total) by mouth 2  (two) times daily.   14 tablet   0   . polyethylene glycol powder (GLYCOLAX/MIRALAX) powder   Oral   Take 17 g by mouth daily as needed for mild constipation.   3350 g   0   . Tavaborole (KERYDIN) 5 % SOLN   Apply externally   Apply 1 drop topically 1 day or 1 dose. Apply 1 drop to the toenail daily.   1 Bottle   5     Allergies Review of patient's allergies indicates no known allergies.  No family history on file.  Social History Social History  Substance Use Topics  . Smoking status: Current Every Day Smoker  . Smokeless tobacco: None  . Alcohol Use: No    Review of Systems  Constitutional: Negative for fever. Cardiovascular: Negative for chest pain. Respiratory: Negative for shortness of breath. Gastrointestinal: positive for epigastric pain Genitourinary: Negative for dysuria. Musculoskeletal: Negative for back pain. Skin: Negative for rash. Neurological: Negative for headaches, focal weakness or numbness.  10-point ROS otherwise negative.  ____________________________________________   PHYSICAL EXAM:  VITAL SIGNS: ED Triage Vitals  Enc Vitals Group     BP  05/05/15 2025 124/91 mmHg     Pulse Rate 05/05/15 2025 62     Resp 05/05/15 2025 20     Temp 05/05/15 2025 98.2 F (36.8 C)     Temp Source 05/05/15 2025 Oral     SpO2 05/05/15 2025 100 %     Weight 05/05/15 2025 116 lb (52.617 kg)     Height 05/05/15 2025  (1.626 m)     Head Cir --      Peak Flow --      Pain Score 05/05/15 2025 10   Constitutional: Alert and oriented. Appears mildly uncomfortable Eyes: Conjunctivae are normal. PERRL. Normal extraocular movements. ENT   Head: Normocephalic and atraumatic.   Nose: No congestion/rhinnorhea.   Mouth/Throat: Mucous membranes are moist.   Neck: No stridor. Hematological/Lymphatic/Immunilogical: No cervical lymphadenopathy. Cardiovascular: Normal rate, regular rhythm.  No murmurs, rubs, or gallops. Respiratory: Normal  respiratory effort without tachypnea nor retractions. Breath sounds are clear and equal bilaterally. No wheezes/rales/rhonchi. Gastrointestinal: Soft and tender to palpation in the epigastric region Genitourinary: Deferred Musculoskeletal: Normal range of motion in all extremities. No joint effusions.  No lower extremity tenderness nor edema. Neurologic:  Normal speech and language. No gross focal neurologic deficits are appreciated. Speech is normal.  Skin:  Skin is warm, dry and intact. No rash noted. Psychiatric: Mood and affect are normal. Speech and behavior are normal. Patient exhibits appropriate insight and judgment.  ____________________________________________    LABS (pertinent positives/negatives)  Pending  ____________________________________________   EKG  None  ____________________________________________    RADIOLOGY  RUQ [place] pending   ____________________________________________   PROCEDURES  Procedure(s) performed: None  Critical Care performed: No  ____________________________________________   INITIAL IMPRESSION / ASSESSMENT AND PLAN / ED COURSE  Pertinent labs & imaging results that were available during my care of the patient were reviewed by me and considered in my medical decision making (see chart for details).  Patient presents to the emergency department today with continued epigastric pain. I will repeat blood work and I will reorder the ultrasound of the right upper quadrant to evaluate for gallbladder disease. I think if that is negative for gastritis is a likely culprit given recent Flagyl use.  ____________________________________________   FINAL CLINICAL IMPRESSION(S) / ED DIAGNOSES  Final diagnoses:  Epigastric abdominal pain     Phineas Semen, MD 05/05/15 2137

## 2015-05-05 NOTE — ED Notes (Signed)
Pt reports seen yesterday for same, but left AMA before having ultrasound. Pt states "i don't need bloodwork, I need something for this pain and an ultrasound."

## 2015-05-06 NOTE — ED Notes (Signed)
Patient with no complaints at this time. Respirations even and unlabored. Skin warm/dry. Discharge instructions reviewed with patient at this time. Patient given opportunity to voice concerns/ask questions. Patient discharged at this time and left Emergency Department with steady gait.   

## 2015-05-10 ENCOUNTER — Other Ambulatory Visit
Admission: RE | Admit: 2015-05-10 | Discharge: 2015-05-10 | Disposition: A | Discharge status: Home / Self Care | Payer: Worker's Compensation | Source: Other Acute Inpatient Hospital | Attending: Family Medicine | Admitting: Family Medicine

## 2015-05-10 NOTE — ED Notes (Signed)
Sent in from [place] for Urine drug for cause per supervisor.

## 2015-05-22 ENCOUNTER — Other Ambulatory Visit (HOSPITAL_COMMUNITY)
Admission: RE | Admit: 2015-05-22 | Discharge: 2015-05-22 | Disposition: A | Discharge status: Home / Self Care | Payer: Managed Care, Other (non HMO) | Source: Ambulatory Visit | Attending: Family Medicine | Admitting: Family Medicine

## 2015-05-22 ENCOUNTER — Ambulatory Visit (INDEPENDENT_AMBULATORY_CARE_PROVIDER_SITE_OTHER): Payer: 59 | Admitting: Family Medicine

## 2015-05-22 ENCOUNTER — Encounter: Payer: Self-pay | Admitting: Family Medicine

## 2015-05-22 VITALS — BP 114/66 | HR 94 | Temp 98.6°F | Ht 64.0 in | Wt 126.0 lb

## 2015-05-22 DIAGNOSIS — R350 Frequency of micturition: Secondary | ICD-10-CM

## 2015-05-22 DIAGNOSIS — F32A Depression, unspecified: Secondary | ICD-10-CM

## 2015-05-22 DIAGNOSIS — R634 Abnormal weight loss: Secondary | ICD-10-CM

## 2015-05-22 DIAGNOSIS — N926 Irregular menstruation, unspecified: Secondary | ICD-10-CM | POA: Diagnosis not present

## 2015-05-22 DIAGNOSIS — F419 Anxiety disorder, unspecified: Secondary | ICD-10-CM

## 2015-05-22 DIAGNOSIS — F418 Other specified anxiety disorders: Secondary | ICD-10-CM

## 2015-05-22 DIAGNOSIS — N898 Other specified noninflammatory disorders of vagina: Secondary | ICD-10-CM

## 2015-05-22 DIAGNOSIS — Z113 Encounter for screening for infections with a predominantly sexual mode of transmission: Secondary | ICD-10-CM | POA: Insufficient documentation

## 2015-05-22 DIAGNOSIS — F329 Major depressive disorder, single episode, unspecified: Secondary | ICD-10-CM

## 2015-05-22 DIAGNOSIS — D72829 Elevated white blood cell count, unspecified: Secondary | ICD-10-CM

## 2015-05-22 LAB — CBC
HEMATOCRIT: 38.3 % (ref 36.0–46.0)
HEMOGLOBIN: 12.6 g/dL (ref 12.0–15.0)
MCHC: 32.9 g/dL (ref 30.0–36.0)
MCV: 92.4 fl (ref 78.0–100.0)
Platelets: 257 10*3/uL (ref 150.0–400.0)
RBC: 4.15 Mil/uL (ref 3.87–5.11)
RDW: 15.8 % — AB (ref 11.5–15.5)
WBC: 11.8 10*3/uL — ABNORMAL HIGH (ref 4.0–10.5)

## 2015-05-22 LAB — COMPREHENSIVE METABOLIC PANEL
ALT: 12 U/L (ref 0–35)
AST: 11 U/L (ref 0–37)
Albumin: 4 g/dL (ref 3.5–5.2)
Alkaline Phosphatase: 35 U/L — ABNORMAL LOW (ref 39–117)
BILIRUBIN TOTAL: 0.2 mg/dL (ref 0.2–1.2)
BUN: 15 mg/dL (ref 6–23)
CALCIUM: 9.4 mg/dL (ref 8.4–10.5)
CO2: 30 meq/L (ref 19–32)
CREATININE: 0.79 mg/dL (ref 0.40–1.20)
Chloride: 103 mEq/L (ref 96–112)
GFR: 87.45 mL/min (ref >60.00)
GLUCOSE: 74 mg/dL (ref 70–99)
Potassium: 4.4 mEq/L (ref 3.5–5.1)
Sodium: 140 mEq/L (ref 135–145)
Total Protein: 6.4 g/dL (ref 6.0–8.3)

## 2015-05-22 LAB — POCT URINALYSIS DIPSTICK
Bilirubin, UA: NEGATIVE
Blood, UA: NEGATIVE
GLUCOSE UA: NEGATIVE
KETONES UA: NEGATIVE
LEUKOCYTES UA: NEGATIVE
Nitrite, UA: NEGATIVE
PROTEIN UA: NEGATIVE
Spec Grav, UA: 1.025
Urobilinogen, UA: 0.2
pH, UA: 5.5

## 2015-05-22 LAB — POCT URINE PREGNANCY: PREG TEST UR: NEGATIVE

## 2015-05-22 LAB — TSH: TSH: 1.66 u[IU]/mL (ref 0.35–4.50)

## 2015-05-22 LAB — HEMOGLOBIN A1C: Hgb A1c MFr Bld: 5.6 % (ref 4.6–6.5)

## 2015-05-22 MED ORDER — ALPRAZOLAM 0.5 MG PO TABS: 0.5000 mg | ORAL_TABLET | Freq: Two times a day (BID) | ORAL | Status: DC | PRN

## 2015-05-22 NOTE — Patient Instructions (Signed)
Nice to see you. Please continue the prozac and xanax for anxiety and depression.  We will await the swabs and let you know the results.  We will call once your blood work is back.  If you develop abdominal pain, nausea, vomiting, diarrhea, vaginal bleeding, fever, pain with urination, thoughts of hurting yourself or others, or have a change in symptoms please seek medical attention.

## 2015-05-22 NOTE — Progress Notes (Signed)
Pre visit review using our clinic review tool, if applicable. No additional management support is needed unless otherwise documented below in the visit note. 

## 2015-05-22 NOTE — Progress Notes (Signed)
Patient ID: Lori Miller, female   DOB: [birth year], 34 y.o.   MRN: [id number]  Tommi Rumps, MD Phone: [phone]  Lori Miller is a 34 y.o. female who presents today for f/u.  Vaginal discharge: notes onset several days ago. Associated with urinary frequency and odor. Has white discharge. Fishy odor. Burning with sex. No fever, abdominal pain, vaginal bleeding, or diarrhea. Had single episode of nausea and vomiting, yesterday.   Irregular periods: over the past 3 months. Period from 8/18-8/23. Then 9/10-9/16. Then 9/28-10/4. Last one had dark blood and cramped with this. No cramping or bleeding since then. No abdominal pain. Not on OCPs. No history of irregular periods. Has been having unprotected sex with her partner. Typically has a single period each month. Has been increasingly stressed and depressed at home and work. Has an appointment with her OB next week.   Weight loss: notes she has gained 10 lbs back. She is still anxious and depressed. Lost her job. This has not helped her anxiety. She has not had any further abdominal pain. She feels well over all. Notes she stopped seeing the counselor, though is going to the health department today to see what resources they have. She did not complete the stool cards. She feels the xanax and prozac have been beneficial. PHQ9 21  PMH: smoker.    ROS see HPI  Objective  Physical Exam Filed Vitals:   05/22/15 0821  BP: 114/66  Pulse: 94  Temp: 98.6 F (37 C)    Physical Exam  Constitutional: She is well-developed, well-nourished, and in no distress.  HENT:  Head: Normocephalic and atraumatic.  Mouth/Throat: Oropharynx is clear and moist.  Cardiovascular: Normal rate, regular rhythm and normal heart sounds.  Exam reveals no gallop and no friction rub.   No murmur heard. Pulmonary/Chest: Effort normal and breath sounds normal. No respiratory distress. She has no wheezes. She has no rales.  Abdominal: Soft. Bowel sounds  are normal. She exhibits no distension and no mass. There is no tenderness. There is no rebound and no guarding.  Genitourinary:  Normal labia, normal vaginal mucosa with moderate amount of white creamy discharge from cervix, normal appearing cervix, no masses or tenderness on bimanual exam, no cervical motion tenderness  Musculoskeletal: She exhibits no edema.  Neurological: She is alert. Gait normal.  Skin: Skin is warm and dry. She is not diaphoretic.  Psychiatric:  Mood depressed, affect depressed     Assessment/Plan: Please see individual problem list.  Anxiety and depression Still struggling with this. Is going to start seeing a counselor again. No SI or HI. Will continue prozac and xanax. Given return precautions.   Loss of weight Gained 10 lbs since last visit. Anxiety and depression are the most likely cause of her weight loss. Suspect will improve with treatment of those issues. Previously noted possible mass in abdomen and patient declined CT scan to evaluate this at that time. Discussed again today. There is no mass palpated today. Discussed obtaining CT scan, though patient declines this again. Given lack of mass on exam today and weight gain this could be reasonable to continue to monitor. Will check TSH and A1c for other causes. Repeat CBC as well given mildly elevated WBC in ED. Will complete stool cards. Given return precautions.   Vaginal discharge Discharge noted on exam. UA negative. Benign abdominal exam. No pelvic pain or tenderness. Will check GC/chlamyida and wet prep. Given return precautions.   Irregular periods Irregular periods over the past  several month. No bleeding at this time. No abdominal pain. Upreg negative. Suspect this is related to stress/depression, though will check TSH and A1c. She will keep her follow-up with OB next week. Given return precautions.     Orders Placed This Encounter  Procedures  . WET PREP FOR [place], YEAST, CLUE  . Comp Met  (CMET)  . TSH  . HgB A1c  . CBC  . POCT Urinalysis Dipstick  . POCT urine pregnancy    Meds ordered this encounter  Medications  . DISCONTD: ALPRAZolam (XANAX) 0.5 MG tablet    Sig: Take 1 tablet (0.5 mg total) by mouth 2 (two) times daily as needed for anxiety.    Dispense:  60 tablet    Refill:  0  . ALPRAZolam (XANAX) 0.5 MG tablet    Sig: Take 1 tablet (0.5 mg total) by mouth 2 (two) times daily as needed for anxiety.    Dispense:  60 tablet    Refill:  0   Tommi Rumps

## 2015-05-23 DIAGNOSIS — N898 Other specified noninflammatory disorders of vagina: Secondary | ICD-10-CM | POA: Insufficient documentation

## 2015-05-23 LAB — WET PREP BY MOLECULAR PROBE
Candida species: NEGATIVE
Gardnerella vaginalis: POSITIVE — AB
Trichomonas vaginosis: NEGATIVE

## 2015-05-23 LAB — CERVICOVAGINAL ANCILLARY ONLY
Chlamydia: NEGATIVE
Neisseria Gonorrhea: NEGATIVE

## 2015-05-23 NOTE — Assessment & Plan Note (Signed)
Still struggling with this. Is going to start seeing a counselor again. No SI or HI. Will continue prozac and xanax. Given return precautions.

## 2015-05-23 NOTE — Assessment & Plan Note (Signed)
Gained 10 lbs since last visit. Anxiety and depression are the most likely cause of her weight loss. Suspect will improve with treatment of those issues. Previously noted possible mass in abdomen and patient declined CT scan to evaluate this at that time. Discussed again today. There is no mass palpated today. Discussed obtaining CT scan, though patient declines this again. Given lack of mass on exam today and weight gain this could be reasonable to continue to monitor. Will check TSH and A1c for other causes. Repeat CBC as well given mildly elevated WBC in ED. Will complete stool cards. Given return precautions.

## 2015-05-23 NOTE — Assessment & Plan Note (Signed)
Irregular periods over the past several month. No bleeding at this time. No abdominal pain. Upreg negative. Suspect this is related to stress/depression, though will check TSH and A1c. She will keep her follow-up with OB next week. Given return precautions.

## 2015-05-23 NOTE — Assessment & Plan Note (Signed)
Discharge noted on exam. UA negative. Benign abdominal exam. No pelvic pain or tenderness. Will check GC/chlamyida and wet prep. Given return precautions.

## 2015-05-24 ENCOUNTER — Other Ambulatory Visit: Payer: Self-pay | Admitting: Family Medicine

## 2015-05-24 MED ORDER — CLINDAMYCIN PHOSPHATE 2 % VA CREA: 1.0000 | TOPICAL_CREAM | Freq: Every day | VAGINAL | Status: DC

## 2015-05-30 ENCOUNTER — Telehealth: Payer: Self-pay | Admitting: Family Medicine

## 2015-05-30 DIAGNOSIS — D72829 Elevated white blood cell count, unspecified: Secondary | ICD-10-CM

## 2015-05-30 NOTE — Telephone Encounter (Signed)
Can you please put in order for lab work.

## 2015-05-30 NOTE — Telephone Encounter (Signed)
Orders placed.

## 2015-05-30 NOTE — Telephone Encounter (Signed)
Pt called to schedule lab work to get her white blood cell check again due to it being elevated. Need order please and thank you!

## 2015-06-01 ENCOUNTER — Other Ambulatory Visit: Payer: Self-pay

## 2015-06-05 ENCOUNTER — Other Ambulatory Visit: Payer: Self-pay

## 2015-06-06 ENCOUNTER — Ambulatory Visit: Payer: Self-pay | Admitting: Family Medicine

## 2015-06-07 ENCOUNTER — Encounter: Payer: Self-pay | Admitting: Surgical

## 2015-06-07 ENCOUNTER — Encounter: Payer: Self-pay | Admitting: Family Medicine

## 2015-06-07 ENCOUNTER — Ambulatory Visit (INDEPENDENT_AMBULATORY_CARE_PROVIDER_SITE_OTHER): Payer: 59 | Admitting: Family Medicine

## 2015-06-07 ENCOUNTER — Encounter (INDEPENDENT_AMBULATORY_CARE_PROVIDER_SITE_OTHER): Payer: Self-pay

## 2015-06-07 VITALS — BP 102/64 | HR 68 | Temp 98.4°F | Ht 64.0 in | Wt 123.0 lb

## 2015-06-07 DIAGNOSIS — M542 Cervicalgia: Secondary | ICD-10-CM | POA: Insufficient documentation

## 2015-06-07 DIAGNOSIS — F419 Anxiety disorder, unspecified: Secondary | ICD-10-CM

## 2015-06-07 DIAGNOSIS — G44209 Tension-type headache, unspecified, not intractable: Secondary | ICD-10-CM | POA: Diagnosis not present

## 2015-06-07 DIAGNOSIS — F418 Other specified anxiety disorders: Secondary | ICD-10-CM

## 2015-06-07 DIAGNOSIS — D72829 Elevated white blood cell count, unspecified: Secondary | ICD-10-CM

## 2015-06-07 DIAGNOSIS — F329 Major depressive disorder, single episode, unspecified: Secondary | ICD-10-CM

## 2015-06-07 MED ORDER — ALPRAZOLAM 0.5 MG PO TABS: 0.5000 mg | ORAL_TABLET | Freq: Three times a day (TID) | ORAL | Status: DC | PRN

## 2015-06-07 MED ORDER — NAPROXEN 500 MG PO TABS: 500.0000 mg | ORAL_TABLET | Freq: Two times a day (BID) | ORAL | Status: DC

## 2015-06-07 NOTE — Assessment & Plan Note (Signed)
Is continuing to have issues with this. She has started seeing a therapist for this and to help with her marijuana use. She notes she feels the Prozac has helped and that the Xanax has helped as well though she feels as though some days she needs it 3 times a day. She does report some thoughts that she would be better off not being around, though has no intent or plan to harm herself. No HI. We'll continue the Prozac. We will increase Xanax to 3 times a day dosing. Patient did sign a controlled substance contract as well in the office and provided us with a urine drug screen as well. She was up front about her marijuana use and that she is now seeking help to quit smoking marijuana. I advised her that in the future if we obtained a urine drug screen and it had marijuana on it, that would be grounds to discontinue her current medication regimen. She noted understanding of this. She was given return precautions and advised to seek medical attention immediately if she develops intent or plan to harm herself or others.

## 2015-06-07 NOTE — Assessment & Plan Note (Signed)
Has been noted on previous lab work. Has remained mildly elevated on several occasions. We will recheck this today. We will obtain a smear review as well.
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
## 2015-06-07 NOTE — Progress Notes (Signed)
Pre visit review using our clinic review tool, if applicable. No additional management support is needed unless otherwise documented below in the visit note. 

## 2015-06-07 NOTE — Patient Instructions (Signed)
Nice to see you. We will obtain an x-ray of your neck to evaluate for cause of your pain. We will treat this with an anti-inflammatory medication. If you develop vision changes, numbness, weakness, worsening headaches, worsening neck pain, fever, or new symptoms please seek medical attention.
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
## 2015-06-07 NOTE — Assessment & Plan Note (Signed)
Patient notes increasing frequency of tension headaches that are in her frontal head. These are similar to her prior headaches. She has no neurological deficits at this time. Suspect the positional tingling is due to nerve compression based on her position. Discussed continuing to monitor these headaches and treat with NSAIDs. She can take the Naprosyn for this. She is to stop the ibuprofen while on the Naprosyn. She was given return precautions.

## 2015-06-08 LAB — CBC WITH DIFFERENTIAL/PLATELET
BASOS ABS: 0.1 10*3/uL (ref 0.0–0.1)
Basophils Relative: 1.6 % (ref 0.0–3.0)
EOS ABS: 0 10*3/uL (ref 0.0–0.7)
EOS PCT: 0.4 % (ref 0.0–5.0)
HCT: 38.1 % (ref 36.0–46.0)
HEMOGLOBIN: 12.6 g/dL (ref 12.0–15.0)
LYMPHS ABS: 3 10*3/uL (ref 0.7–4.0)
Lymphocytes Relative: 33.5 % (ref 12.0–46.0)
MCHC: 33.1 g/dL (ref 30.0–36.0)
MCV: 92.4 fl (ref 78.0–100.0)
MONO ABS: 0.3 10*3/uL (ref 0.1–1.0)
Monocytes Relative: 3.3 % (ref 3.0–12.0)
NEUTROS PCT: 61.2 % (ref 43.0–77.0)
Neutro Abs: 5.5 10*3/uL (ref 1.4–7.7)
Platelets: 251 10*3/uL (ref 150.0–400.0)
RBC: 4.12 Mil/uL (ref 3.87–5.11)
RDW: 14.8 % (ref 11.5–15.5)
WBC: 9 10*3/uL (ref 4.0–10.5)

## 2015-06-08 LAB — PATHOLOGIST SMEAR REVIEW

## 2015-06-11 ENCOUNTER — Encounter: Payer: Self-pay | Admitting: Family Medicine

## 2015-07-02 ENCOUNTER — Other Ambulatory Visit: Payer: Self-pay | Admitting: Family Medicine

## 2015-07-02 NOTE — Telephone Encounter (Signed)
Please advise 

## 2015-07-03 NOTE — Telephone Encounter (Signed)
Pt called back returning Dr Birdie SonsSonnenberg call. I advised that he was gone for the day. Thank you!

## 2015-07-03 NOTE — Telephone Encounter (Signed)
Attempted to call the patient. There is no answer. I left a message asking her to call back to the office.

## 2015-07-04 NOTE — Telephone Encounter (Signed)
Pt called back returning Dr Birdie SonsSonnenberg call. Thank You!

## 2015-07-04 NOTE — Telephone Encounter (Signed)
Spoke with patient regarding her Xanax refill. We discussed her urine drug screen results. There were positive for Suboxone and naloxone. Also positive for morphine, hydromorphone, and oxymorphone. Patient notes she is currently receiving Suboxone and naloxone through [facility] to help wean her off these medications. She does note she received morphine during an ED visit recently after I sent her to the ED from the office for abdominal pain. She notes she has not taken any of these medications outside of a healthcare setting since earlier this year. Advised her that I would like to request records from [facility]. At this time we will give her a one-week refill to cover until we can get the records and investigate further. She will come in today to pick this up and sign a records release.

## 2015-07-11 ENCOUNTER — Telehealth: Payer: Self-pay | Admitting: *Deleted

## 2015-07-11 NOTE — Telephone Encounter (Signed)
Asher MuirJamie do you know if this was printed?

## 2015-07-11 NOTE — Telephone Encounter (Signed)
Patient has requested a medication refill for Xanax, patient has moved and uses the [facility] in [place], [phone].

## 2015-07-12 NOTE — Telephone Encounter (Signed)
Was sent to wrong Asher MuirJamie I think. I am Clayborne ArtistJamie Hayat Warbington CMA at [facility].

## 2015-07-16 MED ORDER — ALPRAZOLAM 0.5 MG PO TABS: ORAL_TABLET | ORAL | Status: DC

## 2015-07-16 NOTE — Telephone Encounter (Signed)
Called and spoke with patient. She notes she ran out of Xanax this morning. I advised her that we had not received records from [facility] yet. She notes she will request the records from there again and will stop by there on Wednesday to get these records. She also notes that she will request medication records from the ED as she states she received morphine while she was there recently. She will do this on Wednesday as well. Given that she is out of Xanax and we'll refill this for a 7 day period while we await records. This was called into her pharmacy in [place]. Once we receive and review the records I will determine if we will provide further long-term refills of Xanax for the patient and will discuss this with the patient.

## 2015-07-16 NOTE — Telephone Encounter (Signed)
Asher MuirJamie, please advise if this was completed.  See below.

## 2015-07-16 NOTE — Telephone Encounter (Signed)
Patient has requested a medication refill for Xanax. Patient stated that she uses the pharmacy in [place], [phone].

## 2015-07-16 NOTE — Telephone Encounter (Signed)
Patient stated that she has not been back to the behavioral health that was prescribing the Suboxone.

## 2015-07-17 NOTE — Telephone Encounter (Signed)
Patient stated that she called the pharmacy for her Rx for her Xanax, that was prescribed for a  7 day supply, however th pharmacy did not receive a Rx. Please advise

## 2015-07-17 NOTE — Telephone Encounter (Signed)
I have called the pharmacy but I am having trouble reaching anyone.

## 2015-07-19 ENCOUNTER — Telehealth: Payer: Self-pay | Admitting: *Deleted

## 2015-07-19 MED ORDER — ALPRAZOLAM 0.5 MG PO TABS: 0.5000 mg | ORAL_TABLET | Freq: Three times a day (TID) | ORAL | Status: DC | PRN

## 2015-07-19 NOTE — Telephone Encounter (Signed)
I called a prescription for xanax in to the pharmacy for her on Monday. Did she pick it up?

## 2015-07-19 NOTE — Telephone Encounter (Signed)
Prescription for one month printed. Should be left at the front for the patient to pick up. Thanks.

## 2015-07-19 NOTE — Telephone Encounter (Signed)
She picked that up, stated it was only for a week.

## 2015-07-19 NOTE — Telephone Encounter (Signed)
Please advise 

## 2015-07-19 NOTE — Telephone Encounter (Signed)
Patient dropped off her urine drug screen, results. Patient stated that any other results can be requested from [facility]. Patient would like a call tomorrow, if she can get her Rx for Xanax. Please Advise.

## 2015-07-19 NOTE — Telephone Encounter (Signed)
Lori MuirJamie CMA spoke with patient and notified of Prescription ready to be picked up.

## 2015-07-20 ENCOUNTER — Encounter: Payer: Self-pay | Admitting: Emergency Medicine

## 2015-07-20 ENCOUNTER — Emergency Department: Payer: Medicaid Other

## 2015-07-20 ENCOUNTER — Emergency Department
Admission: EM | Admit: 2015-07-20 | Discharge: 2015-07-20 | Disposition: A | Discharge status: Home / Self Care | Payer: Medicaid Other | Attending: Emergency Medicine | Admitting: Emergency Medicine

## 2015-07-20 DIAGNOSIS — F17210 Nicotine dependence, cigarettes, uncomplicated: Secondary | ICD-10-CM | POA: Diagnosis not present

## 2015-07-20 DIAGNOSIS — R112 Nausea with vomiting, unspecified: Secondary | ICD-10-CM | POA: Diagnosis present

## 2015-07-20 DIAGNOSIS — Z79899 Other long term (current) drug therapy: Secondary | ICD-10-CM | POA: Diagnosis not present

## 2015-07-20 DIAGNOSIS — K529 Noninfective gastroenteritis and colitis, unspecified: Secondary | ICD-10-CM | POA: Diagnosis not present

## 2015-07-20 DIAGNOSIS — Z3202 Encounter for pregnancy test, result negative: Secondary | ICD-10-CM | POA: Diagnosis not present

## 2015-07-20 DIAGNOSIS — Z791 Long term (current) use of non-steroidal anti-inflammatories (NSAID): Secondary | ICD-10-CM | POA: Insufficient documentation

## 2015-07-20 LAB — URINALYSIS COMPLETE WITH MICROSCOPIC (ARMC ONLY)
BILIRUBIN URINE: NEGATIVE
Bacteria, UA: NONE SEEN
Glucose, UA: NEGATIVE mg/dL
KETONES UR: NEGATIVE mg/dL
Leukocytes, UA: NEGATIVE
Nitrite: NEGATIVE
PROTEIN: NEGATIVE mg/dL
Specific Gravity, Urine: 1.02 (ref 1.005–1.030)
pH: 5 (ref 5.0–8.0)

## 2015-07-20 LAB — COMPREHENSIVE METABOLIC PANEL
ALBUMIN: 4.5 g/dL (ref 3.5–5.0)
ALK PHOS: 32 U/L — AB (ref 38–126)
ALT: 11 U/L — AB (ref 14–54)
AST: 12 U/L — AB (ref 15–41)
Anion gap: 6 (ref 5–15)
BUN: 10 mg/dL (ref 6–20)
CALCIUM: 9.3 mg/dL (ref 8.9–10.3)
CO2: 26 mmol/L (ref 22–32)
CREATININE: 0.94 mg/dL (ref 0.44–1.00)
Chloride: 107 mmol/L (ref 101–111)
GFR calc Af Amer: >60 mL/min (ref >60)
GFR calc non Af Amer: >60 mL/min (ref >60)
GLUCOSE: 128 mg/dL — AB (ref 65–99)
Potassium: 4.1 mmol/L (ref 3.5–5.1)
SODIUM: 139 mmol/L (ref 135–145)
Total Bilirubin: 0.7 mg/dL (ref 0.3–1.2)
Total Protein: 7.5 g/dL (ref 6.5–8.1)

## 2015-07-20 LAB — CBC
HCT: 42.7 % (ref 35.0–47.0)
Hemoglobin: 14.2 g/dL (ref 12.0–16.0)
MCH: 30.4 pg (ref 26.0–34.0)
MCHC: 33.2 g/dL (ref 32.0–36.0)
MCV: 91.4 fL (ref 80.0–100.0)
PLATELETS: 251 10*3/uL (ref 150–440)
RBC: 4.67 MIL/uL (ref 3.80–5.20)
RDW: 14.1 % (ref 11.5–14.5)
WBC: 11.2 10*3/uL — ABNORMAL HIGH (ref 3.6–11.0)

## 2015-07-20 LAB — LIPASE, BLOOD: Lipase: 25 U/L (ref 11–51)

## 2015-07-20 LAB — POCT PREGNANCY, URINE: Preg Test, Ur: NEGATIVE

## 2015-07-20 MED ORDER — HALOPERIDOL LACTATE 5 MG/ML IJ SOLN
5.0000 mg | Freq: Once | INTRAMUSCULAR | Status: AC
  Administered 2015-07-20: 5 mg via INTRAVENOUS
  Filled 2015-07-20: qty 1

## 2015-07-20 MED ORDER — SODIUM CHLORIDE 0.9 % IV BOLUS (SEPSIS)
1000.0000 mL | Freq: Once | INTRAVENOUS | Status: AC
  Administered 2015-07-20: 1000 mL via INTRAVENOUS

## 2015-07-20 MED ORDER — PROMETHAZINE HCL 25 MG/ML IJ SOLN
12.5000 mg | Freq: Once | INTRAMUSCULAR | Status: AC
  Administered 2015-07-20: 12.5 mg via INTRAVENOUS
  Filled 2015-07-20: qty 1

## 2015-07-20 MED ORDER — PROMETHAZINE HCL 25 MG RE SUPP: 25.0000 mg | Freq: Four times a day (QID) | RECTAL | Status: DC | PRN

## 2015-07-20 MED ORDER — IOHEXOL 300 MG/ML  SOLN
100.0000 mL | Freq: Once | INTRAMUSCULAR | Status: AC | PRN
  Administered 2015-07-20: 100 mL via INTRAVENOUS

## 2015-07-20 MED ORDER — HYDROMORPHONE HCL 1 MG/ML IJ SOLN
0.5000 mg | Freq: Once | INTRAMUSCULAR | Status: AC
  Administered 2015-07-20: 0.5 mg via INTRAVENOUS
  Filled 2015-07-20: qty 1

## 2015-07-20 MED ORDER — ONDANSETRON HCL 4 MG/2ML IJ SOLN
4.0000 mg | Freq: Once | INTRAMUSCULAR | Status: AC
  Administered 2015-07-20: 4 mg via INTRAVENOUS

## 2015-07-20 MED ORDER — HALOPERIDOL LACTATE 5 MG/ML IJ SOLN: 5.0000 mg | Freq: Four times a day (QID) | INTRAMUSCULAR | Status: DC | PRN

## 2015-07-20 MED ORDER — IOHEXOL 240 MG/ML SOLN
25.0000 mL | Freq: Once | INTRAMUSCULAR | Status: AC | PRN
  Administered 2015-07-20: 50 mL via ORAL

## 2015-07-20 MED ORDER — PROMETHAZINE HCL 25 MG PO TABS
12.5000 mg | ORAL_TABLET | Freq: Four times a day (QID) | ORAL | Status: DC | PRN
  Administered 2015-07-20: 12.5 mg via ORAL
  Filled 2015-07-20: qty 1

## 2015-07-20 MED ORDER — ONDANSETRON HCL 4 MG/2ML IJ SOLN
INTRAMUSCULAR | Status: AC
  Administered 2015-07-20: 4 mg via INTRAVENOUS
  Filled 2015-07-20: qty 2

## 2015-07-20 MED ORDER — PROMETHAZINE HCL 12.5 MG PO TABS: 12.5000 mg | ORAL_TABLET | Freq: Four times a day (QID) | ORAL | Status: DC | PRN

## 2015-07-20 NOTE — ED Provider Notes (Signed)
Time Seen: Approximately ----------------------------------------- 7:32 AM on 07/20/2015 -----------------------------------------    I have reviewed the triage notes  Chief Complaint: Emesis and Diarrhea   History of Present Illness: Lori Miller is a 34 y.o. female who presents with a 2 day history of almost constant nausea vomiting and diarrhea. Patient describes epigastric pain that started after the nausea and vomiting. She states her is a remote possibility that she is pregnant. She denies any hematemesis or biliary emesis. She denies any previous abdominal surgery. She denies any recent travel or antibiotic therapy. Stool is moderately formed with no melena or hematochezia. Past Medical History  Diagnosis Date  . Migraine   . Depression   . Anxiety     Patient Active Problem List   Diagnosis Date Noted  . Neck pain 06/07/2015  . Tension headache 06/07/2015  . Leukocytosis 06/07/2015  . Vaginal discharge 05/23/2015  . Irregular periods 05/23/2015  . Anxiety and depression 05/01/2015  . Migraine headache 05/01/2015  . Abdominal pain 05/01/2015  . Loss of weight 05/01/2015    Past Surgical History  Procedure Laterality Date  . Cyst excision      Past Surgical History  Procedure Laterality Date  . Cyst excision      Current Outpatient Rx  Name  Route  Sig  Dispense  Refill  . ALPRAZolam (XANAX) 0.5 MG tablet   Oral   Take 1 tablet (0.5 mg total) by mouth 3 (three) times daily as needed for anxiety.   90 tablet   0   . FLUoxetine (PROZAC) 20 MG tablet   Oral   Take 1 tablet (20 mg total) by mouth daily.   30 tablet   3   . naproxen (NAPROSYN) 500 MG tablet   Oral   Take 1 tablet (500 mg total) by mouth 2 (two) times daily with a meal.   30 tablet   0     Allergies:  Flagyl  Family History: No family history on file.  Social History: Social History  Substance Use Topics  . Smoking status: Current Every Day Smoker -- 0.50 packs/day     Types: Cigarettes  . Smokeless tobacco: None  . Alcohol Use: No     Review of Systems:   10 point review of systems was performed and was otherwise negative:  Constitutional: No fever Eyes: No visual disturbances ENT: No sore throat, ear pain Cardiac: No chest pain Respiratory: No shortness of breath, wheezing, or stridor Abdomen: Epigastric abdominal pain with no lower abdominal discomfort Endocrine: No weight loss, No night sweats Extremities: No peripheral edema, cyanosis Skin: No rashes, easy bruising Neurologic: No focal weakness, trouble with speech or swollowing Urologic: No dysuria, Hematuria, or urinary frequency   Physical Exam:  ED Triage Vitals  Enc Vitals Group     BP 07/20/15 0710 109/90 mmHg     Pulse Rate 07/20/15 0710 61     Resp 07/20/15 0710 18     Temp 07/20/15 0710 97.7 F (36.5 C)     Temp Source 07/20/15 0710 Oral     SpO2 07/20/15 0710 100 %     Weight 07/20/15 0710 125 lb (56.7 kg)     Height 07/20/15 0710 5\' 4"  (1.626 m)     Head Cir --      Peak Flow --      Pain Score 07/20/15 0711 10     Pain Loc --      Pain Edu? --  Excl. in GC? --     General: Awake , Alert , and Oriented times 3; GCS 15 Head: Normal cephalic , atraumatic Eyes: Pupils equal , round, reactive to light Nose/Throat: No nasal drainage, patent upper airway without erythema or exudate.  Neck: Supple, Full range of motion, No anterior adenopathy or palpable thyroid masses Lungs: Clear to ascultation without wheezes , rhonchi, or rales Heart: Regular rate, regular rhythm without murmurs , gallops , or rubs Abdomen: Mild epigastric pain without rebound, guarding , or rigidity; bowel sounds positive and symmetric in all 4 quadrants. No organomegaly .     No focal tenderness over McBurney's point, negative Murphy's sign   Extremities: 2 plus symmetric pulses. No edema, clubbing or cyanosis Neurologic: normal ambulation, Motor symmetric without deficits, sensory  intact Skin: warm, dry, no rashes   Labs:   All laboratory work was reviewed including any pertinent negatives or positives listed below:  Labs Reviewed  LIPASE, BLOOD  COMPREHENSIVE METABOLIC PANEL  CBC  URINALYSIS COMPLETEWITH MICROSCOPIC ([facility] ONLY)  POC URINE PREG, ED   laboratory work shows no significant abnormalities   Radiology:   CT ABDOMEN AND PELVIS WITH CONTRAST  TECHNIQUE: Multidetector CT imaging of the abdomen and pelvis was performed using the standard protocol following bolus administration of intravenous contrast.  CONTRAST: OMNIPAQUE IOHEXOL 300 MG/ML SOLN  COMPARISON: No priors.  FINDINGS: Lower chest: Unremarkable.  Hepatobiliary: No suspicious cystic or solid hepatic lesions. Mild diffuse periportal edema. No intra or extrahepatic biliary ductal dilatation. Gallbladder is normal in appearance.  Pancreas: No pancreatic mass. No pancreatic ductal dilatation. No pancreatic or peripancreatic fluid or inflammatory changes.  Spleen: Unremarkable.  Adrenals/Urinary Tract: Normal appearance of the kidneys and bilateral adrenal glands. No hydroureteronephrosis. Urinary bladder is normal in appearance.  Stomach/Bowel: Normal appearance of the stomach. No pathologic dilatation of small bowel or colon. Normal appendix.  Vascular/Lymphatic: No significant atherosclerotic disease, aneurysm or dissection identified in the abdominal or pelvic vasculature. No lymphadenopathy noted in the abdomen or pelvis.  Reproductive: Retroverted uterus. Ovaries are unremarkable in appearance.  Other: Trace volume of free fluid predominantly in the cul-de-sac, presumably physiologic in this young female patient. No larger volume of ascites. No pneumoperitoneum.  Musculoskeletal: There are no aggressive appearing lytic or blastic lesions noted in the visualized portions of the skeleton.  IMPRESSION: 1. Mild diffuse periportal edema. This is nonspecific,  but can be seen in the setting of acute hepatitis. Correlation with liver function tests is suggested. 2. Trace volume of free fluid in the cul-de-sac, likely physiologic  I personally reviewed the radiologic studies    ED Course: Patient's stay here showed gradual symptomatic improvement. Patient responded best to Haldol IV after Zofran was unsuccessful and Phenergan and had some marginal success here in emergency department. I felt most of the patient's vomiting at this time seemed to be more psychogenic than anything else. CAT scan shows no bowel obstruction or any surgical causes for her discomfort. Patient describes a history of nausea vomiting and loose stool and I felt this may be viral gastroenteritis in nature. Patient was discharged home with Phenergan tablets and suppositories.    Assessment: * Acute viral gastroenteritis  Final Clinical Impression:  Acute viral gastroenteritis Final diagnoses:  None     Plan: Outpatient management Patient was advised to return immediately if condition worsens. Patient was advised to follow up with their primary care physician or other specialized physicians involved in their outpatient care  Jennye Moccasin, MD 07/20/15 1400

## 2015-07-20 NOTE — ED Notes (Signed)
States she was feeling better but nausea has returned  Dry heaves noted additional meds given iv side rails up and effects explained

## 2015-07-20 NOTE — ED Notes (Signed)
Pt here with c/o N,V,D for 2 days now, states she can't keep anything down, upper mid abd pain also.

## 2015-08-14 NOTE — Telephone Encounter (Signed)
Please advise the location of Rx, not in front.

## 2015-08-14 NOTE — Telephone Encounter (Signed)
Patient needs to be seen for refills

## 2015-08-14 NOTE — Telephone Encounter (Signed)
Patient said that she was wanting to pick up RX for January

## 2015-08-14 NOTE — Telephone Encounter (Signed)
Please advise 

## 2015-08-14 NOTE — Telephone Encounter (Signed)
Patient returned call stating that she;s requesting the Rx for the month of January. Please advise

## 2015-08-14 NOTE — Telephone Encounter (Signed)
Patient coming for appointment 08/15/14

## 2015-08-14 NOTE — Telephone Encounter (Signed)
Left message for patient to return call. Patient picked up prescription on 07/19/15 and signed it out herself.

## 2015-08-15 ENCOUNTER — Encounter: Payer: Self-pay | Admitting: Family Medicine

## 2015-08-15 ENCOUNTER — Ambulatory Visit (INDEPENDENT_AMBULATORY_CARE_PROVIDER_SITE_OTHER): Payer: Medicaid Other | Admitting: Family Medicine

## 2015-08-15 ENCOUNTER — Encounter (INDEPENDENT_AMBULATORY_CARE_PROVIDER_SITE_OTHER): Payer: Self-pay

## 2015-08-15 VITALS — BP 104/68 | HR 94 | Temp 99.2°F | Ht 64.0 in | Wt 118.4 lb

## 2015-08-15 DIAGNOSIS — J069 Acute upper respiratory infection, unspecified: Secondary | ICD-10-CM | POA: Diagnosis not present

## 2015-08-15 DIAGNOSIS — F418 Other specified anxiety disorders: Secondary | ICD-10-CM | POA: Diagnosis not present

## 2015-08-15 DIAGNOSIS — F32A Depression, unspecified: Secondary | ICD-10-CM

## 2015-08-15 DIAGNOSIS — N898 Other specified noninflammatory disorders of vagina: Secondary | ICD-10-CM

## 2015-08-15 DIAGNOSIS — F419 Anxiety disorder, unspecified: Secondary | ICD-10-CM

## 2015-08-15 DIAGNOSIS — F329 Major depressive disorder, single episode, unspecified: Secondary | ICD-10-CM | POA: Diagnosis not present

## 2015-08-15 MED ORDER — FLUTICASONE PROPIONATE 50 MCG/ACT NA SUSP
2.0000 | Freq: Every day | NASAL | Status: DC
Start: 2015-08-15 — End: 2016-04-30

## 2015-08-15 MED ORDER — LORATADINE 10 MG PO TABS: 10.0000 mg | ORAL_TABLET | Freq: Every day | ORAL | Status: DC

## 2015-08-15 MED ORDER — ALPRAZOLAM 0.5 MG PO TABS: 0.5000 mg | ORAL_TABLET | Freq: Three times a day (TID) | ORAL | Status: DC | PRN

## 2015-08-15 NOTE — Assessment & Plan Note (Signed)
Viral upper respiratory infection is the likely cause of her nasal symptoms. Suspect this is also the cause of her headache. She is neurologically intact. At this time I do not believe she has a bacterial sinus infection given her constellation of symptoms and that she has been afebrile. We will treat her with Flonase and Claritin. She's given return precautions.

## 2015-08-15 NOTE — Progress Notes (Signed)
Patient ID: Lori Miller, female   DOB: May 07, 1981, 35 y.o.   MRN: [id number]  [name], MD Phone: [phone]  Lori Miller is a 35 y.o. female who presents today for follow-up.  Depression/anxiety: Patient notes this is worsened recently. She has no energy. She's not working and has no money. Has difficulty sleeping. States she stopped taking Prozac several weeks ago. She is bouncing around with her living situation and currently does not have a home. She notes having little interest in doing things. Feels like she has little energy. Poor appetite. Feels bad about herself. Difficulty concentrating on things. She does endorse thoughts that she be better off not being around, though no intent or plan to harm herself. No HI. She is taking Xanax 3 times a day. She does note there were several occasions were she took it 4 times a day over the holidays.  Upper respiratory infection: Patient notes starting late last week she developed nasal congestion, rhinorrhea, and postnasal drip. She notes this is persistent. She notes it was followed by development of left-sided frontal sinus headache and throbbing. She notes headache is there constantly. It was gradual in onset. There is no numbness, weakness, or vision changes. No photophobia or phonophobia. Has a history of migraines and this headache is different. She's been taking Advil and Tylenol as needed for headache with minimal benefit. She has not had any fevers. She notes no other sinus congestion. No cough or shortness of breath.  Patient notes she was [facility] at a [facility] in [place] over the weekend. She had some vaginal discharge that was thick like cottage cheese and white. She also some vaginal burning. States they took her urine and then placed her on Diflucan, Macrobid, and clindamycin. She does not have any abdominal pain with this. She notes her symptoms have resolved with the second dose of Diflucan. She notes she stopped  taking the Macrobid and clindamycin after she had developed some mild diarrhea with this. She notes no abdominal pain with the diarrhea. No vomiting. Minimal nausea. Her abdomen feels fine at this time. No fevers. She wants to know if she can stop the antibiotics as her symptoms have resolved.  PMH: Current smoker   ROS see history of present illness  Objective  Physical Exam Filed Vitals:   08/15/15 1552  BP: 104/68  Pulse: 94  Temp: 99.2 F (37.3 C)    BP Readings from Last 3 Encounters:  08/15/15 104/68  07/20/15 115/73  06/07/15 102/64   Wt Readings from Last 3 Encounters:  08/15/15 118 lb 6.4 oz (53.706 kg)  07/20/15 125 lb (56.7 kg)  06/07/15 123 lb (55.792 kg)    Physical Exam  Constitutional: She is well-developed, well-nourished, and in no distress.  HENT:  Head: Normocephalic and atraumatic.  Right Ear: External ear normal.  Left Ear: External ear normal.  Mouth/Throat: No oropharyngeal exudate.  Mild posterior oropharyngeal erythema, normal TMs  Eyes: Conjunctivae are normal. Pupils are equal, round, and reactive to light.  Neck: Neck supple.  Cardiovascular: Normal rate and normal heart sounds.  Exam reveals no gallop and no friction rub.   No murmur heard. Pulmonary/Chest: Effort normal and breath sounds normal. No respiratory distress. She has no wheezes. She has no rales.  Abdominal: Soft. Bowel sounds are normal. She exhibits no distension. There is no tenderness. There is no rebound and no guarding.  Musculoskeletal: She exhibits no edema.  Lymphadenopathy:    She has no cervical  adenopathy.  Neurological: She is alert.  CN 2-12 intact, 5/5 strength in bilateral biceps, triceps, grip, quads, hamstrings, plantar and dorsiflexion, sensation to light touch intact in bilateral UE and LE, normal gait, 2+ patellar reflexes  Skin: Skin is warm and dry. She is not diaphoretic.  Psychiatric:  Mood depressed, affect flat     Assessment/Plan: Please see  individual problem list.  Anxiety and depression Poorly controlled at this time. Patient stopped her Prozac. PHQ 9 score of 24 with extremely difficult. GAD 7 score of 15 with extremely difficult. Passed MDQ. Advised patient that Xanax is not the end all be all treatment or solution for her issues. Discussed that she needed to either be on an SSRI or similar medication or needed to see a therapist to continue to receive the Xanax. She opted for therapy as SSRIs have caused sexual dysfunction in her previously. Referral to therapy will be placed. She does have some thoughts of being better off not being around though no intent or plan to harm herself. She is advised if she ever developed intent or plan to harm herself or others she needed to seek medical attention immediately. Given return precautions.  URI, acute Viral upper respiratory infection is the likely cause of her nasal symptoms. Suspect this is also the cause of her headache. She is neurologically intact. At this time I do not believe she has a bacterial sinus infection given her constellation of symptoms and that she has been afebrile. We will treat her with Flonase and Claritin. She's given return precautions.  Vaginal discharge [facility]en at [facility] for this. Discharge is resolved. No vaginal symptoms at this time. Benign abdominal exam. She did develop some diarrhea following taking antibiotics. She appears well-hydrated. Advised to eat yogurt or take a probiotic to help with this. Should improve with stopping the antibiotics. Given return precautions.   patient additionally reports that she did not complete stool cards previously and does not know where these are. She does not want to complete these at this time.  Orders Placed This Encounter  Procedures  . Ambulatory referral to Psychology    Referral Priority:  Routine    Referral Type:  Psychiatric    Referral Reason:  Specialty Services Required    Requested Specialty:   Psychology    Number of Visits Requested:  1    Meds ordered this encounter  Medications  . fluticasone (FLONASE) 50 MCG/ACT nasal spray    Sig: Place 2 sprays into both nostrils daily.    Dispense:  16 g    Refill:  6  . loratadine (CLARITIN) 10 MG tablet    Sig: Take 1 tablet (10 mg total) by mouth daily.    Dispense:  30 tablet    Refill:  11  . ALPRAZolam (XANAX) 0.5 MG tablet    Sig: Take 1 tablet (0.5 mg total) by mouth 3 (three) times daily as needed for anxiety.    Dispense:  90 tablet    Refill:  0    Dragon voice recognition software was used during the dictation process of this note. If any phrases or words seem inappropriate it is likely secondary to the translation process being inefficient.  Marikay AlarEric Halston Kintz

## 2015-08-15 NOTE — Assessment & Plan Note (Signed)
Poorly controlled at this time. Patient stopped her Prozac. PHQ 9 score of 24 with extremely difficult. GAD 7 score of 15 with extremely difficult. Passed MDQ. Advised patient that Xanax is not the end all be all treatment or solution for her issues. Discussed that she needed to either be on an SSRI or similar medication or needed to see a therapist to continue to receive the Xanax. She opted for therapy as SSRIs have caused sexual dysfunction in her previously. Referral to therapy will be placed. She does have some thoughts of being better off not being around though no intent or plan to harm herself. She is advised if she ever developed intent or plan to harm herself or others she needed to seek medical attention immediately. Given return precautions.

## 2015-08-15 NOTE — Patient Instructions (Signed)
Nice to see you. Your symptoms are likely related to a viral URI. Please take the flonase and claritin for this.  Please stop your antibiotics. Please call [facility] for your results. Please monitor for recurrence of symptoms.  We will refer you to a therapist.  If you develop headache, numbness, weakness, vision changes, abdominal pain, nausea, vomiting, diarrhea, blood in your stool, thoughts of harming yourself or others, or any new or change in symptoms please seek medical attention.

## 2015-08-15 NOTE — Progress Notes (Signed)
Pre visit review using our clinic review tool, if applicable. No additional management support is needed unless otherwise documented below in the visit note. 

## 2015-08-15 NOTE — Assessment & Plan Note (Signed)
Seen at a [facility] for this. Discharge is resolved. No vaginal symptoms at this time. Benign abdominal exam. She did develop some diarrhea following taking antibiotics. She appears well-hydrated. Advised to eat yogurt or take a probiotic to help with this. Should improve with stopping the antibiotics. Given return precautions.

## 2015-08-16 ENCOUNTER — Ambulatory Visit: Payer: Self-pay | Admitting: Family Medicine

## 2015-08-22 ENCOUNTER — Emergency Department
Admission: EM | Admit: 2015-08-22 | Discharge: 2015-08-22 | Disposition: A | Discharge status: Home / Self Care | Payer: Medicaid Other | Attending: Emergency Medicine | Admitting: Emergency Medicine

## 2015-08-22 ENCOUNTER — Encounter: Payer: Self-pay | Admitting: Emergency Medicine

## 2015-08-22 DIAGNOSIS — Z7951 Long term (current) use of inhaled steroids: Secondary | ICD-10-CM | POA: Insufficient documentation

## 2015-08-22 DIAGNOSIS — R21 Rash and other nonspecific skin eruption: Secondary | ICD-10-CM | POA: Diagnosis present

## 2015-08-22 DIAGNOSIS — F329 Major depressive disorder, single episode, unspecified: Secondary | ICD-10-CM | POA: Diagnosis not present

## 2015-08-22 DIAGNOSIS — Z791 Long term (current) use of non-steroidal anti-inflammatories (NSAID): Secondary | ICD-10-CM | POA: Diagnosis not present

## 2015-08-22 DIAGNOSIS — F419 Anxiety disorder, unspecified: Secondary | ICD-10-CM | POA: Insufficient documentation

## 2015-08-22 DIAGNOSIS — B009 Herpesviral infection, unspecified: Secondary | ICD-10-CM | POA: Diagnosis not present

## 2015-08-22 DIAGNOSIS — Z79899 Other long term (current) drug therapy: Secondary | ICD-10-CM | POA: Diagnosis not present

## 2015-08-22 DIAGNOSIS — F17210 Nicotine dependence, cigarettes, uncomplicated: Secondary | ICD-10-CM | POA: Insufficient documentation

## 2015-08-22 MED ORDER — LIDOCAINE 4 % EX CREA: TOPICAL_CREAM | Freq: Once | CUTANEOUS | Status: DC

## 2015-08-22 MED ORDER — TRAMADOL HCL 50 MG PO TABS
50.0000 mg | ORAL_TABLET | Freq: Once | ORAL | Status: AC
  Administered 2015-08-22: 50 mg via ORAL
  Filled 2015-08-22: qty 1

## 2015-08-22 MED ORDER — LIDOCAINE VISCOUS 2 % MT SOLN
15.0000 mL | Freq: Once | OROMUCOSAL | Status: AC
  Administered 2015-08-22: 15 mL via OROMUCOSAL
  Filled 2015-08-22: qty 15

## 2015-08-22 MED ORDER — TRAMADOL HCL 50 MG PO TABS: 50.0000 mg | ORAL_TABLET | Freq: Four times a day (QID) | ORAL | Status: DC | PRN

## 2015-08-22 MED ORDER — VALACYCLOVIR HCL 500 MG PO TABS: 500.0000 mg | ORAL_TABLET | Freq: Three times a day (TID) | ORAL | Status: AC

## 2015-08-22 MED ORDER — LIDOCAINE 5 % EX OINT: 1.0000 "application " | TOPICAL_OINTMENT | CUTANEOUS | Status: DC | PRN

## 2015-08-22 NOTE — Discharge Instructions (Signed)
Advised follow-up [facility] for definitive evaluation and treatment.

## 2015-08-22 NOTE — ED Notes (Signed)
Pt comes into the ED via POV c/o newly developed rash over her mouth and nose.  Patient also present with cold like symptoms that have been going on for 1 week.
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
## 2015-09-06 ENCOUNTER — Telehealth: Payer: Self-pay | Admitting: Family Medicine

## 2015-09-06 NOTE — Telephone Encounter (Signed)
I do not see anything in a note for a f/u. Please advise

## 2015-09-06 NOTE — Telephone Encounter (Signed)
Pt called wanting to know when does she need to come in? Call pt @ [phone]. Thank you!

## 2015-09-06 NOTE — Telephone Encounter (Signed)
Patient was supposed to follow-up one month after last visit. She should follow-up in the first or second week in February.

## 2015-09-07 NOTE — Telephone Encounter (Signed)
Pt has been scheduled for 09/12/15

## 2015-09-12 ENCOUNTER — Encounter: Payer: Self-pay | Admitting: Family Medicine

## 2015-09-12 ENCOUNTER — Ambulatory Visit (INDEPENDENT_AMBULATORY_CARE_PROVIDER_SITE_OTHER): Payer: Medicaid Other | Admitting: Family Medicine

## 2015-09-12 VITALS — BP 118/66 | HR 99 | Temp 98.6°F | Ht 64.0 in | Wt 126.4 lb

## 2015-09-12 DIAGNOSIS — F419 Anxiety disorder, unspecified: Secondary | ICD-10-CM

## 2015-09-12 DIAGNOSIS — B349 Viral infection, unspecified: Secondary | ICD-10-CM

## 2015-09-12 DIAGNOSIS — F418 Other specified anxiety disorders: Secondary | ICD-10-CM

## 2015-09-12 DIAGNOSIS — F32A Depression, unspecified: Secondary | ICD-10-CM

## 2015-09-12 DIAGNOSIS — G43809 Other migraine, not intractable, without status migrainosus: Secondary | ICD-10-CM

## 2015-09-12 DIAGNOSIS — F329 Major depressive disorder, single episode, unspecified: Secondary | ICD-10-CM

## 2015-09-12 MED ORDER — METHYLPREDNISOLONE ACETATE 40 MG/ML IJ SUSP
40.0000 mg | Freq: Once | INTRAMUSCULAR | Status: AC
  Administered 2015-09-12: 40 mg via INTRAMUSCULAR

## 2015-09-12 MED ORDER — ALPRAZOLAM 0.5 MG PO TABS: 0.5000 mg | ORAL_TABLET | Freq: Three times a day (TID) | ORAL | Status: DC | PRN

## 2015-09-12 MED ORDER — PROMETHAZINE HCL 12.5 MG PO TABS: 12.5000 mg | ORAL_TABLET | Freq: Three times a day (TID) | ORAL | Status: DC | PRN

## 2015-09-12 NOTE — Progress Notes (Signed)
Pre visit review using our clinic review tool, if applicable. No additional management support is needed unless otherwise documented below in the visit note. 

## 2015-09-12 NOTE — Progress Notes (Signed)
Patient ID: Lori Miller, female   DOB: Oct 03, 1980, 35 y.o.   MRN: [id number]  Lori Alar, MD Phone: [phone]  Lori Miller is a 35 y.o. female who presents today for follow up.  Depression/anxiety: Patient notes this is a little bit better. She notes she is eating better. Her life situation has not changed. She still does not have a job and is bouncing between places to live. She was never contacted about psychology. She is looking forward to seeing a therapist. She's taking Xanax 3 times a day. No SI or HI. See PHQ 9 and GAD 7 documentation for symptomatology.  Migraine: Patient notes developing mild headache 3 days ago that feels as though it is now trying to develop into a migraine. She has a history of migraines. Notes mild throbbing sensation left side of her head. Gradual onset. Some photophobia. No numbness, weakness, visual changes, or neck stiffness. She has taken Tylenol and ibuprofen for this with some benefit. States typically she gets Toradol and Phenergan when she gets a migraine.  She does feel congested in her nose. She's also had some diarrhea and sweats the last couple of days. No blood in her stool. Some nausea. No vomiting. No sinus congestion. No trouble breathing. No abdominal pain.  PMH: smoker.   ROS the history of present illness  Objective  Physical Exam Filed Vitals:   09/12/15 1004  BP: 118/66  Pulse: 99  Temp: 98.6 F (37 C)    Physical Exam  Constitutional: She is well-developed, well-nourished, and in no distress.  HENT:  Head: Normocephalic and atraumatic.  Right Ear: External ear normal.  Left Ear: External ear normal.  Mouth/Throat: Oropharynx is clear and moist. No oropharyngeal exudate.  Normal TMs bilaterally  Eyes: Conjunctivae are normal. Pupils are equal, round, and reactive to light.  Neck: Neck supple.  Cardiovascular: Normal rate, regular rhythm and normal heart sounds.  Exam reveals no gallop and no friction rub.     No murmur heard. Pulmonary/Chest: Effort normal and breath sounds normal. No respiratory distress. She has no wheezes. She has no rales.  Abdominal: Soft. Bowel sounds are normal. She exhibits no distension. There is no tenderness. There is no rebound and no guarding.  Lymphadenopathy:    She has no cervical adenopathy.  Neurological: She is alert.  CN 2-12 intact, 5/5 strength in bilateral biceps, triceps, grip, quads, hamstrings, plantar and dorsiflexion, sensation to light touch intact in bilateral UE and LE, normal gait, 2+ patellar reflexes  Skin: Skin is warm and dry. She is not diaphoretic.     Assessment/Plan: Please see individual problem list.  Anxiety and depression Per patient slightly improved. Using Xanax 3 times a day. Refill given today. Checked with referral coordinator regarding the psychology referral and they're going to check into this. Patient really needs to see therapist for this as she does not want to take SSRIs for this issue. Given return precautions.  Migraine headache Patient with likely migraine today. Notes slow onset after onset of nasal congestion and diarrhea. Neurologically intact. Afebrile. She is given Depo-Medrol in the office. Prescribed Phenergan for nausea and for headache. Given return precautions.  Viral illness Patient's symptoms of headaches, nasal congestion, diarrhea, nausea, and some night sweats could be related to viral illness. She has no focal findings on exam to indicate bacterial illness. Her vitals are stable. She appears well. Advised on supportive care. Can use Flonase and Claritin. Given return precautions.    Meds ordered this encounter  Medications  . ALPRAZolam (XANAX) 0.5 MG tablet    Sig: Take 1 tablet (0.5 mg total) by mouth 3 (three) times daily as needed for anxiety.    Dispense:  90 tablet    Refill:  0  . promethazine (PHENERGAN) 12.5 MG tablet    Sig: Take 1 tablet (12.5 mg total) by mouth every 8 (eight) hours as  needed for nausea or vomiting.    Dispense:  20 tablet    Refill:  0  . methylPREDNISolone acetate (DEPO-MEDROL) injection 40 mg    Sig:     Lori Miller

## 2015-09-12 NOTE — Assessment & Plan Note (Addendum)
Patient's symptoms of headaches, nasal congestion, diarrhea, nausea, and some night sweats could be related to viral illness. She has no focal findings on exam to indicate bacterial illness. Her vitals are stable. She appears well. Advised on supportive care. Can use Flonase and Claritin. Given return precautions.

## 2015-09-12 NOTE — Assessment & Plan Note (Signed)
Patient with likely migraine today. Notes slow onset after onset of nasal congestion and diarrhea. Neurologically intact. Afebrile. She is given Depo-Medrol in the office. Prescribed Phenergan for nausea and for headache. Given return precautions.

## 2015-09-12 NOTE — Assessment & Plan Note (Signed)
Per patient slightly improved. Using Xanax 3 times a day. Refill given today. Checked with referral coordinator regarding the psychology referral and they're going to check into this. Patient really needs to see therapist for this as she does not want to take SSRIs for this issue. Given return precautions.

## 2015-09-12 NOTE — Patient Instructions (Signed)
Nice to see you. I have refilled your Atarax. We will work on trying to get you to see a psychologist. Please continue to monitor your headache. She can take Phenergan as needed for this. We have given you a steroid shot to help with this. Please monitor your diarrhea. If it worsens or you develop fever or abdominal pain please seek medical attention. If you develop thoughts of harming herself or others, numbness, weakness, vision changes, abdominal pain, fevers, vomiting, blood in her stool, or any new or changing symptoms please seek medical attention. If your night sweats persist after the other symptoms are improved we will consider further workup.

## 2015-10-04 ENCOUNTER — Ambulatory Visit: Payer: Self-pay | Admitting: Family Medicine

## 2015-10-08 ENCOUNTER — Telehealth: Payer: Self-pay | Admitting: Family Medicine

## 2015-10-08 NOTE — Telephone Encounter (Signed)
Last refilled at the first of this month. Please advise?

## 2015-10-09 ENCOUNTER — Telehealth: Payer: Self-pay | Admitting: *Deleted

## 2015-10-09 MED ORDER — FLUCONAZOLE 150 MG PO TABS: 150.0000 mg | ORAL_TABLET | Freq: Once | ORAL | Status: DC

## 2015-10-09 NOTE — Telephone Encounter (Signed)
Pt is requesting a diflucan pill, pt was given an antibiotic by her dentist and currently has a yeast infection. Please advise

## 2015-10-09 NOTE — Telephone Encounter (Signed)
Fine to call in Diflucan 150mg po x 1 

## 2015-10-09 NOTE — Telephone Encounter (Signed)
Patients dentist prescribed amoxicillin, she now have a yeast infection. She has requested a diflucan .  Pharmacy [facility] on [address] [phone]

## 2015-10-09 NOTE — Telephone Encounter (Signed)
Rx sent 

## 2015-10-10 NOTE — Telephone Encounter (Signed)
Pt called to follow up on the ALPRAZolam (XANAX) 0.5 MG tablet. Please call when it's ready @ [phone]. Thank you!

## 2015-10-11 NOTE — Telephone Encounter (Signed)
Patient was called and she stated she had received prescription last night from pharmacy.

## 2015-10-18 ENCOUNTER — Other Ambulatory Visit (HOSPITAL_COMMUNITY)
Admission: RE | Admit: 2015-10-18 | Discharge: 2015-10-18 | Disposition: A | Discharge status: Home / Self Care | Payer: Medicaid Other | Source: Ambulatory Visit | Attending: Family Medicine | Admitting: Family Medicine

## 2015-10-18 ENCOUNTER — Ambulatory Visit (INDEPENDENT_AMBULATORY_CARE_PROVIDER_SITE_OTHER): Payer: Medicaid Other | Admitting: Family Medicine

## 2015-10-18 ENCOUNTER — Encounter: Payer: Self-pay | Admitting: Family Medicine

## 2015-10-18 VITALS — BP 104/64 | HR 90 | Temp 98.5°F | Ht 64.0 in | Wt 133.0 lb

## 2015-10-18 DIAGNOSIS — Z202 Contact with and (suspected) exposure to infections with a predominantly sexual mode of transmission: Secondary | ICD-10-CM

## 2015-10-18 DIAGNOSIS — N898 Other specified noninflammatory disorders of vagina: Secondary | ICD-10-CM | POA: Diagnosis not present

## 2015-10-18 DIAGNOSIS — F418 Other specified anxiety disorders: Secondary | ICD-10-CM

## 2015-10-18 DIAGNOSIS — F32A Depression, unspecified: Secondary | ICD-10-CM

## 2015-10-18 DIAGNOSIS — Z113 Encounter for screening for infections with a predominantly sexual mode of transmission: Secondary | ICD-10-CM | POA: Diagnosis present

## 2015-10-18 DIAGNOSIS — R350 Frequency of micturition: Secondary | ICD-10-CM

## 2015-10-18 DIAGNOSIS — R10819 Abdominal tenderness, unspecified site: Secondary | ICD-10-CM | POA: Diagnosis not present

## 2015-10-18 DIAGNOSIS — F419 Anxiety disorder, unspecified: Secondary | ICD-10-CM

## 2015-10-18 DIAGNOSIS — F329 Major depressive disorder, single episode, unspecified: Secondary | ICD-10-CM

## 2015-10-18 LAB — POCT URINALYSIS DIPSTICK
BILIRUBIN UA: NEGATIVE
GLUCOSE UA: NEGATIVE
KETONES UA: NEGATIVE
Leukocytes, UA: NEGATIVE
NITRITE UA: NEGATIVE
PH UA: 6
RBC UA: NEGATIVE
Urobilinogen, UA: 0.2

## 2015-10-18 LAB — URINALYSIS, MICROSCOPIC ONLY

## 2015-10-18 LAB — POCT URINE PREGNANCY: Preg Test, Ur: NEGATIVE

## 2015-10-18 MED ORDER — FLUCONAZOLE 150 MG PO TABS: 150.0000 mg | ORAL_TABLET | ORAL | Status: DC

## 2015-10-18 MED ORDER — ALPRAZOLAM 0.5 MG PO TABS: 0.5000 mg | ORAL_TABLET | Freq: Three times a day (TID) | ORAL | Status: DC | PRN
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
## 2015-10-18 NOTE — Patient Instructions (Signed)
Nice to see you. We'll refill your Xanax. Continue to monitor your anxiety and depression. We will treat you with Diflucan for possible yeast infection. We will send off swabs and blood tests to evaluate her discharge further. If you develop abdominal pain, fevers, worsening discharge, positive harming herself or others, or any new or changing symptoms please seek medical attention.

## 2015-10-18 NOTE — Assessment & Plan Note (Signed)
Much improved per patient. Life situation is improved. She is using Xanax 3 times daily. She will continue Xanax. She'll continue to monitor. Given return precautions.

## 2015-10-18 NOTE — Assessment & Plan Note (Addendum)
Asian has not been evaluated for this issue previously. Patient with persistent vaginal discharge despite treatment with single Diflucan tablet. Suspect yeast infection given that she was treated with antibiotics and discharge started following this. We'll check wet prep and gonorrhea and chlamydia. We will also check for other STDs as outlined below. We'll treat her with Diflucan as outlined below. We will additionally send for urine culture and microscopy given small protein noted on UA. She will continue to monitor. She's given return precautions.

## 2015-10-18 NOTE — Progress Notes (Signed)
Pre visit review using our clinic review tool, if applicable. No additional management support is needed unless otherwise documented below in the visit note. 

## 2015-10-19 LAB — HEPATITIS B CORE ANTIBODY, TOTAL: HEP B C TOTAL AB: NONREACTIVE

## 2015-10-19 LAB — HEPATITIS C ANTIBODY: HCV Ab: NEGATIVE

## 2015-10-19 LAB — HEPATITIS B SURFACE ANTIBODY,QUALITATIVE: Hep B S Ab: NEGATIVE

## 2015-10-19 LAB — HEPATITIS B SURFACE ANTIGEN: Hepatitis B Surface Ag: NEGATIVE

## 2015-10-19 LAB — CERVICOVAGINAL ANCILLARY ONLY
CHLAMYDIA, DNA PROBE: NEGATIVE
NEISSERIA GONORRHEA: NEGATIVE

## 2015-10-19 LAB — RPR

## 2015-10-19 LAB — WET PREP BY MOLECULAR PROBE
CANDIDA SPECIES: NEGATIVE
Gardnerella vaginalis: NEGATIVE
TRICHOMONAS VAG: NEGATIVE

## 2015-10-19 LAB — HIV ANTIBODY (ROUTINE TESTING W REFLEX): HIV 1&2 Ab, 4th Generation: NONREACTIVE

## 2015-10-20 LAB — URINE CULTURE
Colony Count: NO GROWTH
ORGANISM ID, BACTERIA: NO GROWTH

## 2015-10-23 ENCOUNTER — Ambulatory Visit (INDEPENDENT_AMBULATORY_CARE_PROVIDER_SITE_OTHER): Payer: Medicaid Other

## 2015-10-23 DIAGNOSIS — Z23 Encounter for immunization: Secondary | ICD-10-CM | POA: Diagnosis not present

## 2015-10-23 NOTE — Progress Notes (Signed)
Patient came to receive her 1st of three Hepatits B injections.  Received in Right deltoid.  Patient tolerated well.

## 2015-11-15 ENCOUNTER — Ambulatory Visit: Payer: Self-pay | Admitting: Family Medicine

## 2015-11-15 ENCOUNTER — Other Ambulatory Visit: Payer: Self-pay | Admitting: Family Medicine

## 2015-11-15 ENCOUNTER — Telehealth: Payer: Self-pay | Admitting: Family Medicine

## 2015-11-15 MED ORDER — ALPRAZOLAM 0.5 MG PO TABS: 0.5000 mg | ORAL_TABLET | Freq: Three times a day (TID) | ORAL | Status: DC | PRN

## 2015-11-15 NOTE — Telephone Encounter (Signed)
Pt appt was resch from 04/06 pt states she will be out of her medication by Monday 11/21/2015. Medication is ALPRAZolam (XANAX) 0.5 MG tablet. Call pt when it's ready. Call pt @ [phone]. Thank you!

## 2015-11-15 NOTE — Telephone Encounter (Signed)
Dr. Adriana Simasook refilled Xanax for 10 days. This should be enough to get her to her appointment on 11/21/15. Notified patient and she will come pick this up.

## 2015-11-16 NOTE — Telephone Encounter (Signed)
Noted and appreciated

## 2015-11-21 ENCOUNTER — Ambulatory Visit (INDEPENDENT_AMBULATORY_CARE_PROVIDER_SITE_OTHER): Payer: Medicaid Other | Admitting: Family Medicine

## 2015-11-21 ENCOUNTER — Encounter: Payer: Self-pay | Admitting: Family Medicine

## 2015-11-21 VITALS — BP 96/58 | HR 82 | Temp 98.5°F | Ht 64.0 in | Wt 134.8 lb

## 2015-11-21 DIAGNOSIS — F329 Major depressive disorder, single episode, unspecified: Secondary | ICD-10-CM

## 2015-11-21 DIAGNOSIS — B079 Viral wart, unspecified: Secondary | ICD-10-CM | POA: Diagnosis not present

## 2015-11-21 DIAGNOSIS — Z23 Encounter for immunization: Secondary | ICD-10-CM | POA: Diagnosis not present

## 2015-11-21 DIAGNOSIS — R600 Localized edema: Secondary | ICD-10-CM

## 2015-11-21 DIAGNOSIS — F32A Depression, unspecified: Secondary | ICD-10-CM

## 2015-11-21 DIAGNOSIS — F418 Other specified anxiety disorders: Secondary | ICD-10-CM | POA: Diagnosis not present

## 2015-11-21 DIAGNOSIS — R609 Edema, unspecified: Secondary | ICD-10-CM | POA: Diagnosis not present

## 2015-11-21 DIAGNOSIS — F419 Anxiety disorder, unspecified: Secondary | ICD-10-CM

## 2015-11-21 LAB — COMPREHENSIVE METABOLIC PANEL
ALBUMIN: 4.2 g/dL (ref 3.5–5.2)
ALK PHOS: 41 U/L (ref 39–117)
ALT: 21 U/L (ref 0–35)
AST: 20 U/L (ref 0–37)
BILIRUBIN TOTAL: 0.5 mg/dL (ref 0.2–1.2)
BUN: 12 mg/dL (ref 6–23)
CO2: 29 mEq/L (ref 19–32)
CREATININE: 0.92 mg/dL (ref 0.40–1.20)
Calcium: 9.7 mg/dL (ref 8.4–10.5)
Chloride: 106 mEq/L (ref 96–112)
GFR: 73.15 mL/min (ref >60.00)
GLUCOSE: 95 mg/dL (ref 70–99)
Potassium: 4.8 mEq/L (ref 3.5–5.1)
SODIUM: 141 meq/L (ref 135–145)
TOTAL PROTEIN: 6.3 g/dL (ref 6.0–8.3)

## 2015-11-21 LAB — TSH: TSH: 0.25 u[IU]/mL — ABNORMAL LOW (ref 0.35–4.50)

## 2015-11-21 LAB — CBC
HCT: 41.3 % (ref 36.0–46.0)
Hemoglobin: 13.9 g/dL (ref 12.0–15.0)
MCHC: 33.7 g/dL (ref 30.0–36.0)
MCV: 91.2 fl (ref 78.0–100.0)
Platelets: 236 10*3/uL (ref 150.0–400.0)
RBC: 4.52 Mil/uL (ref 3.87–5.11)
RDW: 13.9 % (ref 11.5–15.5)
WBC: 7.8 10*3/uL (ref 4.0–10.5)

## 2015-11-21 MED ORDER — ALPRAZOLAM 0.5 MG PO TABS: 0.5000 mg | ORAL_TABLET | Freq: Three times a day (TID) | ORAL | Status: DC | PRN

## 2015-11-21 NOTE — Patient Instructions (Signed)
Nice to see you. I'm glad you're doing much better. We have refilled her Xanax. We will check some basic lab work to evaluate or swelling. Please continue to monitor this. If you develop shortness of breath, chest pain, thoughts of harming herself or others, or any new or changing symptoms please seek medical attention.

## 2015-11-21 NOTE — Assessment & Plan Note (Signed)
Significantly improved. No SI or HI. She'll continue Xanax at this time. Given 3 separate dictated prescriptions for a month's worth of Xanax. We will continue to monitor. Given return precautions.

## 2015-11-21 NOTE — Progress Notes (Signed)
Pre visit review using our clinic review tool, if applicable. No additional management support is needed unless otherwise documented below in the visit note. 

## 2015-11-21 NOTE — Progress Notes (Signed)
Patient ID: Lori Miller, female   DOB: 08-Jun-1981, 35 y.o.   MRN: [id number]  Tommi Rumps, MD Phone: [phone]  Lori Miller is a 35 y.o. female who presents today for follow-up.  Depression/anxiety: Patient notes she is feeling significantly better with regards to this. Things are going well at work and at home. She is taking Xanax 3 times daily. Does not make her drowsy. No SI or HI. Please see HQ 90 GAD 7 for symptomatology.  Extremity swelling: Patient notes about a month ago her lower legs and ankles bilaterally were swollen. This resolved after a day. She had been sitting down more frequently at that time. She notes yesterday both of her hands started to swell. No arm swelling with this. Notes they are improved today. No pain in her arms or legs with this. No orthopnea or PND. No chest pain or shortness of breath. No history of thyroid, liver, or kidney dysfunction. No history of anemia. No history of DVT. No recent surgeries or traveling.  Wart: Patient notes on her right palm there has been a wart for several months. She notes 5-6 months ago she did have glass in this area that she believe she got out. She declines getting an x-ray to evaluate for glass. She has no pain in this area.  PMH: Smoker   ROS see history of present illness  Objective  Physical Exam Filed Vitals:   11/21/15 0901  BP: 96/58  Pulse: 82  Temp: 98.5 F (36.9 C)    BP Readings from Last 3 Encounters:  11/21/15 96/58  10/18/15 104/64  09/12/15 118/66   Wt Readings from Last 3 Encounters:  11/21/15 134 lb 12.8 oz (61.145 kg)  10/18/15 133 lb (60.328 kg)  09/12/15 126 lb 6.4 oz (57.335 kg)    Physical Exam  Constitutional: She is well-developed, well-nourished, and in no distress.  HENT:  Head: Normocephalic and atraumatic.  Right Ear: External ear normal.  Left Ear: External ear normal.  Cardiovascular: Normal rate, regular rhythm and normal heart sounds.   Pulmonary/Chest:  Effort normal and breath sounds normal.  Musculoskeletal:  Minimal swelling of right hand, no swelling of left hand, no swelling in her arms, no swelling in her legs, hands are nontender, hands are warm and well-perfused  Neurological: She is alert. Gait normal.  Skin: Skin is warm and dry. She is not diaphoretic.  Small 2 mm filiform wart on right palm, nontender     Assessment/Plan: Please see individual problem list.  Anxiety and depression Significantly improved. No SI or HI. She'll continue Xanax at this time. Given 3 separate dictated prescriptions for a month's worth of Xanax. We will continue to monitor. Given return precautions.  Extremity edema Minimal swelling in her right hand. No other appreciable swelling. No CHF symptoms. No other history noted point a cause. It is been several months since we checked lab work with regards to kidney and liver function and thyroid function. We will check lab work as outlined below to work this up. Given that is improving we will continue to monitor as well. If it worsens or recurs she will let us know.  Cutaneous wart Lesion on palm appears consistent with a wart. She does report having had glass stuck in this area and I advised x-ray to rule out retained foreign object though she declined this. She wants to try over-the-counter wart medication and if not improved she would then consider an x-ray.    Orders Placed This Encounter  Procedures  . CBC  . Comp Met (CMET)  . TSH    Meds ordered this encounter  Medications  . ALPRAZolam (XANAX) 0.5 MG tablet    Sig: Take 1 tablet (0.5 mg total) by mouth 3 (three) times daily as needed. for anxiety    Dispense:  90 tablet    Refill:  0  . ALPRAZolam (XANAX) 0.5 MG tablet    Sig: Take 1 tablet (0.5 mg total) by mouth 3 (three) times daily as needed for anxiety. Do not fill until 12/21/15    Dispense:  90 tablet    Refill:  0  . ALPRAZolam (XANAX) 0.5 MG tablet    Sig: Take 1 tablet (0.5 mg  total) by mouth 3 (three) times daily as needed for anxiety. Do not fill until 01/21/16    Dispense:  90 tablet    Refill:  0    Tommi Rumps, MD [place]

## 2015-11-21 NOTE — Assessment & Plan Note (Signed)
Lesion on palm appears consistent with a wart. She does report having had glass stuck in this area and I advised x-ray to rule out retained foreign object though she declined this. She wants to try over-the-counter wart medication and if not improved she would then consider an x-ray.

## 2015-11-21 NOTE — Assessment & Plan Note (Signed)
Minimal swelling in her right hand. No other appreciable swelling. No CHF symptoms. No other history noted point a cause. It is been several months since we checked lab work with regards to kidney and liver function and thyroid function. We will check lab work as outlined below to work this up. Given that is improving we will continue to monitor as well. If it worsens or recurs she will let us know.

## 2015-11-21 NOTE — Addendum Note (Signed)
Addended by: Dorian PodWHEELEY, [name] J on: 11/21/2015 10:17 AM   Modules accepted: Orders

## 2015-11-22 ENCOUNTER — Other Ambulatory Visit (INDEPENDENT_AMBULATORY_CARE_PROVIDER_SITE_OTHER): Payer: Medicaid Other

## 2015-11-22 DIAGNOSIS — O43899 Other placental disorders, unspecified trimester: Secondary | ICD-10-CM

## 2015-11-22 DIAGNOSIS — R609 Edema, unspecified: Secondary | ICD-10-CM

## 2015-11-22 LAB — T4, FREE: FREE T4: 0.82 ng/dL (ref 0.60–1.60)

## 2015-11-22 LAB — T3, FREE: T3, Free: 3.2 pg/mL (ref 2.3–4.2)

## 2015-11-25 ENCOUNTER — Encounter: Payer: Self-pay | Admitting: Family Medicine

## 2015-11-26 ENCOUNTER — Other Ambulatory Visit: Payer: Self-pay | Admitting: Family Medicine

## 2015-11-26 DIAGNOSIS — L989 Disorder of the skin and subcutaneous tissue, unspecified: Secondary | ICD-10-CM

## 2015-12-05 ENCOUNTER — Telehealth: Payer: Self-pay | Admitting: *Deleted

## 2015-12-05 NOTE — Telephone Encounter (Signed)
VoiceMail: Patient stated that she would like to have diflucan called into the pharmacy, she's sure that she has a yeast infection  Pt. Contact [phone]

## 2015-12-05 NOTE — Telephone Encounter (Signed)
Scheduled patient appointment for 12/06/15

## 2015-12-06 ENCOUNTER — Encounter: Payer: Self-pay | Admitting: Family Medicine

## 2015-12-06 ENCOUNTER — Other Ambulatory Visit (HOSPITAL_COMMUNITY)
Admission: RE | Admit: 2015-12-06 | Discharge: 2015-12-06 | Disposition: A | Discharge status: Home / Self Care | Payer: Medicaid Other | Source: Ambulatory Visit | Attending: Family Medicine | Admitting: Family Medicine

## 2015-12-06 ENCOUNTER — Ambulatory Visit (INDEPENDENT_AMBULATORY_CARE_PROVIDER_SITE_OTHER): Payer: Medicaid Other | Admitting: Family Medicine

## 2015-12-06 VITALS — BP 98/58 | HR 90 | Temp 98.8°F | Ht 64.0 in | Wt 134.8 lb

## 2015-12-06 DIAGNOSIS — N898 Other specified noninflammatory disorders of vagina: Secondary | ICD-10-CM

## 2015-12-06 DIAGNOSIS — Z113 Encounter for screening for infections with a predominantly sexual mode of transmission: Secondary | ICD-10-CM | POA: Insufficient documentation

## 2015-12-06 DIAGNOSIS — N949 Unspecified condition associated with female genital organs and menstrual cycle: Secondary | ICD-10-CM | POA: Diagnosis not present

## 2015-12-06 DIAGNOSIS — R1032 Left lower quadrant pain: Secondary | ICD-10-CM

## 2015-12-06 LAB — POCT URINALYSIS DIPSTICK
Bilirubin, UA: NEGATIVE
Blood, UA: NEGATIVE
Glucose, UA: NEGATIVE
Ketones, UA: NEGATIVE
Leukocytes, UA: NEGATIVE
Nitrite, UA: NEGATIVE
Protein, UA: NEGATIVE
SPEC GRAV UA: 1.02
UROBILINOGEN UA: 0.2
pH, UA: 6

## 2015-12-06 LAB — POCT URINE PREGNANCY: Preg Test, Ur: NEGATIVE

## 2015-12-06 MED ORDER — AZITHROMYCIN 500 MG PO TABS: 1000.0000 mg | ORAL_TABLET | Freq: Once | ORAL | Status: DC

## 2015-12-06 MED ORDER — CEFTRIAXONE SODIUM 500 MG IJ SOLR
500.0000 mg | Freq: Once | INTRAMUSCULAR | Status: AC
  Administered 2015-12-06: 500 mg via INTRAMUSCULAR

## 2015-12-06 MED ORDER — FLUCONAZOLE 150 MG PO TABS: 150.0000 mg | ORAL_TABLET | ORAL | Status: DC

## 2015-12-06 MED ORDER — BUTALBITAL-APAP-CAFFEINE 50-325-40 MG PO TABS: 1.0000 | ORAL_TABLET | Freq: Four times a day (QID) | ORAL | Status: DC | PRN

## 2015-12-06 NOTE — Assessment & Plan Note (Addendum)
Patient with vaginal discharge and left-sided abdominal discomfort over the last week. She had cervical motion tenderness on exam. No adnexal tenderness or masses. Abdominal tenderness is in the left mid abdomen adjacent to the umbilicus. UA was negative. Urine pregnancy test is negative. No evidence of GI dysfunction. Doubt ovarian pathology given lack of adnexal tenderness and lack of worsening of pain over the last week. Discussed options for workup including imaging and lab work. Patient opted for lab work first and then if no obvious cause on lab work to obtain further imaging if discomfort persists. We will check lab work as outlined below. We will treat her with ceftriaxone IM in the office. We will give her a prescription for azithromycin 1000 mg take once. Treat discharge with Diflucan as well. Advised against having sexual activity and if she does she needs to use condoms. She is given return precautions.

## 2015-12-06 NOTE — Progress Notes (Signed)
Patient ID: Lori Miller, female   DOB: 09/04/1980, 35 y.o.   MRN: [id number]  Tommi Rumps, MD Phone: [phone]  Lori Miller is a 35 y.o. female who presents today for same-day visit.  Patient reports for the last 7 days she's had vaginal itching and thick white discharge with some left-sided mid stomach adjacent to the umbilicus discomfort. No pelvic pain or suprapubic pain. She notes no nausea, vomiting, or diarrhea. No vaginal discharge. Last menstrual period was 1.5 weeks ago. No fevers. Notes some urinary urgency though no urinary frequency or dysuria. She was last sexually active 4 months ago. Notes last bowel movement was yesterday.  She also notes migraines over the last week. Notes this starts in the back of her head and radiates to her temples. Start slow and builds up. Has some photophobia and phonophobia with him. No numbness or weakness. No vision changes. Ibuprofen and Tylenol were not much help. She took her mother's diet lauded yesterday and has not had a headache since then.  PMH: Smoker   ROS see history of present illness  Objective  Physical Exam Filed Vitals:   12/06/15 1558  BP: 98/58  Pulse: 90  Temp: 98.8 F (37.1 C)   Physical Exam  Constitutional: She is well-developed, well-nourished, and in no distress.  HENT:  Head: Normocephalic and atraumatic.  Right Ear: External ear normal.  Left Ear: External ear normal.  Mouth/Throat: Oropharynx is clear and moist. No oropharyngeal exudate.  Eyes: Conjunctivae are normal. Pupils are equal, round, and reactive to light.  Cardiovascular: Normal rate, regular rhythm and normal heart sounds.  Exam reveals no gallop and no friction rub.   No murmur heard. Pulmonary/Chest: Effort normal and breath sounds normal.  Abdominal: Soft. Bowel sounds are normal. She exhibits no distension. There is tenderness (left mid abdomen, No other tenderness). There is no rebound and no guarding.  Genitourinary:    Normal labia, normal vaginal mucosa, thick white discharge noted throughout the vagina, no discharge from the cervical os, normal cervical os, there is cervical motion tenderness, there is no adnexal tenderness or masses, there is no suprapubic tenderness  Musculoskeletal: She exhibits no edema.  Neurological: She is alert.  CN 2-12 intact, 5/5 strength in bilateral biceps, triceps, grip, quads, hamstrings, plantar and dorsiflexion, sensation to light touch intact in bilateral UE and LE, normal gait, 2+ patellar reflexes  Skin: Skin is warm and dry. She is not diaphoretic.     Assessment/Plan: Please see individual problem list.  Migraine headache Patient with recent migraines. No headache at this time. Neurologically intact. We will provide her with a prescription for Fioricet to use as an abortive therapy. She is given return precautions.  Vaginal discharge Patient with vaginal discharge and left-sided abdominal discomfort over the last week. She had cervical motion tenderness on exam. No adnexal tenderness or masses. Abdominal tenderness is in the left mid abdomen adjacent to the umbilicus. UA was negative. Urine pregnancy test is negative. No evidence of GI dysfunction. Doubt ovarian pathology given lack of adnexal tenderness and lack of worsening of pain over the last week. Discussed options for workup including imaging and lab work. Patient opted for lab work first and then if no obvious cause on lab work to obtain further imaging if discomfort persists. We will check lab work as outlined below. We will treat her with ceftriaxone IM in the office. We will give her a prescription for azithromycin 1000 mg take once. Treat discharge with Diflucan as  well. Advised against having sexual activity and if she does she needs to use condoms. She is given return precautions.    Orders Placed This Encounter  Procedures  . WET PREP BY MOLECULAR PROBE  . CBC  . Comp Met (CMET)  . Sed Rate (ESR)  .  POCT Urinalysis Dipstick  . POCT urine pregnancy    Meds ordered this encounter  Medications  . azithromycin (ZITHROMAX) 500 MG tablet    Sig: Take 2 tablets (1,000 mg total) by mouth once.    Dispense:  2 tablet    Refill:  0  . butalbital-acetaminophen-caffeine (FIORICET) 50-325-40 MG tablet    Sig: Take 1 tablet by mouth every 6 (six) hours as needed for headache or migraine.    Dispense:  10 tablet    Refill:  0  . fluconazole (DIFLUCAN) 150 MG tablet    Sig: Take 1 tablet (150 mg total) by mouth every 3 (three) days.    Dispense:  2 tablet    Refill:  0  . cefTRIAXone (ROCEPHIN) injection 500 mg    Sig:     Tommi Rumps, MD [place]

## 2015-12-06 NOTE — Progress Notes (Signed)
Pre visit review using our clinic review tool, if applicable. No additional management support is needed unless otherwise documented below in the visit note. 

## 2015-12-06 NOTE — Patient Instructions (Signed)
Nice to see you. I have given a prescription for Fioricet to take for your headaches. She take this immediately upon sensing headache coming on. We have also sent in azithromycin for you to take. You should avoid sexual activity until we have the results of your swabs. If you are sexually active you should use condoms. We will obtain lab work and call you with the results. If you develop worsening abdominal pain, nausea, vomiting, blood in her stool, worsening headaches, numbness, weakness, vision changes, fevers, or any new or changing symptoms please seek medical attention.

## 2015-12-06 NOTE — Assessment & Plan Note (Signed)
Patient with recent migraines. No headache at this time. Neurologically intact. We will provide her with a prescription for Fioricet to use as an abortive therapy. She is given return precautions.

## 2015-12-07 ENCOUNTER — Other Ambulatory Visit: Payer: Self-pay | Admitting: Surgical

## 2015-12-07 ENCOUNTER — Other Ambulatory Visit: Payer: Self-pay | Admitting: Family Medicine

## 2015-12-07 LAB — WET PREP BY MOLECULAR PROBE
CANDIDA SPECIES: NEGATIVE
Gardnerella vaginalis: POSITIVE — AB
TRICHOMONAS VAG: NEGATIVE

## 2015-12-07 LAB — COMPREHENSIVE METABOLIC PANEL
ALBUMIN: 4.2 g/dL (ref 3.5–5.2)
ALK PHOS: 36 U/L — AB (ref 39–117)
ALT: 11 U/L (ref 0–35)
AST: 16 U/L (ref 0–37)
BILIRUBIN TOTAL: 0.5 mg/dL (ref 0.2–1.2)
BUN: 11 mg/dL (ref 6–23)
CO2: 28 mEq/L (ref 19–32)
CREATININE: 0.97 mg/dL (ref 0.40–1.20)
Calcium: 9.4 mg/dL (ref 8.4–10.5)
Chloride: 102 mEq/L (ref 96–112)
GFR: 68.8 mL/min (ref >60.00)
Glucose, Bld: 80 mg/dL (ref 70–99)
Potassium: 4.5 mEq/L (ref 3.5–5.1)
SODIUM: 135 meq/L (ref 135–145)
TOTAL PROTEIN: 6.5 g/dL (ref 6.0–8.3)

## 2015-12-07 LAB — CBC
HCT: 37.2 % (ref 36.0–46.0)
Hemoglobin: 12.6 g/dL (ref 12.0–15.0)
MCHC: 33.9 g/dL (ref 30.0–36.0)
MCV: 91.3 fl (ref 78.0–100.0)
Platelets: 241 10*3/uL (ref 150.0–400.0)
RBC: 4.07 Mil/uL (ref 3.87–5.11)
RDW: 13.4 % (ref 11.5–15.5)
WBC: 8.6 10*3/uL (ref 4.0–10.5)

## 2015-12-07 LAB — SEDIMENTATION RATE: SED RATE: 7 mm/h (ref 0–22)

## 2015-12-07 MED ORDER — CLINDAMYCIN PHOSPHATE 100 MG VA SUPP: 100.0000 mg | Freq: Every day | VAGINAL | Status: DC

## 2015-12-07 MED ORDER — AZITHROMYCIN 500 MG PO TABS: 1000.0000 mg | ORAL_TABLET | Freq: Once | ORAL | Status: DC

## 2015-12-10 LAB — CERVICOVAGINAL ANCILLARY ONLY
CHLAMYDIA, DNA PROBE: NEGATIVE
NEISSERIA GONORRHEA: NEGATIVE

## 2015-12-14 ENCOUNTER — Encounter: Payer: Self-pay | Admitting: Surgical

## 2015-12-19 ENCOUNTER — Telehealth: Payer: Self-pay | Admitting: *Deleted

## 2015-12-19 NOTE — Telephone Encounter (Signed)
Patient stated that she received a letter to call to receive her lab results. Pt contact [phone]

## 2015-12-19 NOTE — Telephone Encounter (Signed)
Patient notified of the results and has pick up medication from pharmacy.

## 2015-12-20 ENCOUNTER — Telehealth: Payer: Self-pay | Admitting: Family Medicine

## 2015-12-20 NOTE — Telephone Encounter (Signed)
[name] Please advise?  

## 2015-12-20 NOTE — Telephone Encounter (Signed)
Caller name:Lori Miller Relationship to patient: patient Can be reached: [phone]  Reason for call: pt requested a call from [name]-CMA to discuss some things that are going on.

## 2015-12-20 NOTE — Telephone Encounter (Signed)
Spoke with patient and she stated that she got back together with her boyfriend. They have had intercourse and he has a breakout of herpes at this time. I explained that we would not be able to treat him due to him not being our patient. She ask if she needed treatment due to him having the breakout. She does not have any sores or breakouts at this time so I would speak with Dr. De NurseSonneberg about this.

## 2015-12-21 NOTE — Telephone Encounter (Signed)
Advised patient of this when I spoke with her.

## 2015-12-21 NOTE — Telephone Encounter (Signed)
Patient does not need treatment at this time if she does not have any symptoms. They should avoid sexual activity while he has symptoms.

## 2015-12-24 NOTE — Telephone Encounter (Signed)
Scheduled patient to come in tomorrow 

## 2015-12-24 NOTE — Telephone Encounter (Signed)
Patient requesting a call back from [place]ie . Regarding previous message.

## 2015-12-25 ENCOUNTER — Ambulatory Visit (INDEPENDENT_AMBULATORY_CARE_PROVIDER_SITE_OTHER): Payer: Medicaid Other | Admitting: Family Medicine

## 2015-12-25 ENCOUNTER — Other Ambulatory Visit (HOSPITAL_COMMUNITY)
Admission: RE | Admit: 2015-12-25 | Discharge: 2015-12-25 | Disposition: A | Discharge status: Home / Self Care | Payer: Medicaid Other | Source: Ambulatory Visit | Attending: Family Medicine | Admitting: Family Medicine

## 2015-12-25 ENCOUNTER — Encounter: Payer: Self-pay | Admitting: Family Medicine

## 2015-12-25 VITALS — BP 130/68 | HR 99 | Temp 98.7°F | Wt 131.2 lb

## 2015-12-25 DIAGNOSIS — N898 Other specified noninflammatory disorders of vagina: Secondary | ICD-10-CM

## 2015-12-25 DIAGNOSIS — R1030 Lower abdominal pain, unspecified: Secondary | ICD-10-CM | POA: Diagnosis not present

## 2015-12-25 DIAGNOSIS — G441 Vascular headache, not elsewhere classified: Secondary | ICD-10-CM

## 2015-12-25 DIAGNOSIS — Z113 Encounter for screening for infections with a predominantly sexual mode of transmission: Secondary | ICD-10-CM | POA: Diagnosis present

## 2015-12-25 DIAGNOSIS — M546 Pain in thoracic spine: Secondary | ICD-10-CM

## 2015-12-25 DIAGNOSIS — R350 Frequency of micturition: Secondary | ICD-10-CM | POA: Diagnosis not present

## 2015-12-25 DIAGNOSIS — G43809 Other migraine, not intractable, without status migrainosus: Secondary | ICD-10-CM

## 2015-12-25 LAB — POCT URINALYSIS DIPSTICK
BILIRUBIN UA: NEGATIVE
Blood, UA: NEGATIVE
Glucose, UA: NEGATIVE
KETONES UA: NEGATIVE
LEUKOCYTES UA: NEGATIVE
Nitrite, UA: NEGATIVE
PH UA: 6
Protein, UA: NEGATIVE
Spec Grav, UA: 1.025
Urobilinogen, UA: 0.2

## 2015-12-25 LAB — POCT URINE PREGNANCY: Preg Test, Ur: NEGATIVE

## 2015-12-25 MED ORDER — METHYLPREDNISOLONE ACETATE 40 MG/ML IJ SUSP
40.0000 mg | Freq: Once | INTRAMUSCULAR | Status: AC
  Administered 2015-12-25: 40 mg via INTRAMUSCULAR

## 2015-12-25 MED ORDER — PROMETHAZINE HCL 12.5 MG PO TABS: 12.5000 mg | ORAL_TABLET | Freq: Three times a day (TID) | ORAL | Status: DC | PRN

## 2015-12-25 NOTE — Patient Instructions (Addendum)
Nice to see you. We'll send your vaginal swabs for evaluation. You can take ibuprofen or Tylenol for any discomfort related to musculoskeletal injury. We will check some blood work as well. I sent in Phenergan for you to take for your migraine and nausea. If you develop numbness, weakness, vision changes, abdominal pain, vaginal bleeding, worsening back pain, worsening headache, or any new or changing symptoms please seek medical attention.

## 2015-12-25 NOTE — Assessment & Plan Note (Signed)
Headache consistent with her migraines. Neurologically intact. We will treat with Depo-Medrol in the office. Phenergan prescribed to take at home for nausea and headache. She notes Fioricet did not help last time. If she has recurrent headaches may need to consider alternative medication. Given return precautions.

## 2015-12-25 NOTE — Assessment & Plan Note (Signed)
Patient with recent onset vaginal discharge. Vaginal discharge noted on pelvic exam. No cervical motion tenderness. Mild intermittent tenderness on abdominal exam. Negative pregnancy test. Negative  urinalysis. we'll send GC/Chlamydia and wet prep. We'll check for HSV 1 and 2 she's been recently exposed. She'll continue to monitor. She's given return precautions.

## 2015-12-25 NOTE — Progress Notes (Signed)
Pre visit review using our clinic review tool, if applicable. No additional management support is needed unless otherwise documented below in the visit note. 

## 2015-12-25 NOTE — Assessment & Plan Note (Signed)
Suspect related to recent collision playing softball. No midline discomfort or tenderness. No red flags. Neurologically intact. Discussed Tylenol and ibuprofen. Heat and ice as well. She'll continue to monitor. Given return precautions.

## 2015-12-25 NOTE — Progress Notes (Signed)
Patient ID: Lori Miller, female   DOB: [birth year], 35 y.o.   MRN: [id number]  [name], MD Phone: [phone]  Lori Miller is a 35 y.o. female who presents today for same-day visit.  Vaginal discharge: Started in the last couple of days. Recently had sex with her boyfriend who just got out of prison. Notes there was burning with sexual intercourse though no burning now. Notes discharge is white and is not thick. It is consistent with her typical vaginal discharge though she has a significantly larger amount. No fevers. No abdominal pain. Notes some urinary frequency but no urgency or dysuria. LMP ended 4 days ago. Some nausea. No vomiting or diarrhea. No cramping in her abdomen. No blisters or herpes lesions. Patient reports recent exposure to herpes.  Migraine: Patient notes gradual onset of right-sided migraine over the last several days. Notes it is progressing. Vision is okay. No numbness or weakness. Notes it is typical of her typical migraines. No photophobia or phonophobia. No medications tried.  Thoracic back discomfort: Patient notes about a week and a half ago she was playing softball and got run over at home plate. Notes bilateral thoracic muscular discomfort intermittently since then. Worsened with rotational movements. No new numbness or weakness. No loss of bowel or bladder function. No saddle anesthesia. No fevers. No history of cancer. No medications tried.  PMH: Smoker   ROS see history of present illness  Objective  Physical Exam Filed Vitals:   12/25/15 1124  BP: 130/68  Pulse: 99  Temp: 98.7 F (37.1 C)    BP Readings from Last 3 Encounters:  12/25/15 130/68  12/06/15 98/58  11/21/15 96/58   Wt Readings from Last 3 Encounters:  12/25/15 131 lb 3.2 oz (59.512 kg)  12/06/15 134 lb 12.8 oz (61.145 kg)  11/21/15 134 lb 12.8 oz (61.145 kg)    Physical Exam  Constitutional: She is well-developed, well-nourished, and in no distress.  HENT:    Head: Normocephalic and atraumatic.  Right Ear: External ear normal.  Left Ear: External ear normal.  Cardiovascular: Normal rate, regular rhythm and normal heart sounds.   Pulmonary/Chest: Effort normal and breath sounds normal.  Abdominal: Soft. Bowel sounds are normal. She exhibits no distension. There is tenderness (minimal suprapubic tenderness to palpation, though on repeat exam no discomfort on palpation). There is no rebound and no guarding.  Genitourinary:  Normal labia, normal vaginal mucosa, white creamy discharge noted throughout the vaginal vault, normal appearing cervix, no cervical motion tenderness, no adnexal masses or tenderness, no suprapubic tenderness on bimanual exam  Musculoskeletal: She exhibits no edema.  No midline spine tenderness, no midline spine step-off, there is bilateral muscular back tenderness, there is a small abrasion over the midline spine  Neurological: She is alert.  CN 2-12 intact, 5/5 strength in bilateral biceps, triceps, grip, quads, hamstrings, plantar and dorsiflexion, sensation to light touch intact in bilateral UE and LE, normal gait, 2+ patellar reflexes  Skin: Skin is warm. She is not diaphoretic.     Assessment/Plan: Please see individual problem list.  Vaginal discharge Patient with recent onset vaginal discharge. Vaginal discharge noted on pelvic exam. No cervical motion tenderness. Mild intermittent tenderness on abdominal exam. Negative pregnancy test. Negative  urinalysis. we'll send GC/Chlamydia and wet prep. We'll check for HSV 1 and 2 she's been recently exposed. She'll continue to monitor. She's given return precautions.   Migraine headache Headache consistent with her migraines. Neurologically intact. We will treat with Depo-Medrol in the  office. Phenergan prescribed to take at home for nausea and headache. She notes Fioricet did not help last time. If she has recurrent headaches may need to consider alternative medication. Given  return precautions.  Thoracic back pain Suspect related to recent collision playing softball. No midline discomfort or tenderness. No red flags. Neurologically intact. Discussed Tylenol and ibuprofen. Heat and ice as well. She'll continue to monitor. Given return precautions.    Orders Placed This Encounter  Procedures  . WET PREP BY MOLECULAR PROBE  . HSV(herpes simplex vrs) 1+2 ab-IgG  . POCT urinalysis dipstick  . POCT urine pregnancy    Meds ordered this encounter  Medications  . promethazine (PHENERGAN) 12.5 MG tablet    Sig: Take 1 tablet (12.5 mg total) by mouth every 8 (eight) hours as needed for nausea or vomiting.    Dispense:  20 tablet    Refill:  0  . methylPREDNISolone acetate (DEPO-MEDROL) injection 40 mg    Sig:      Marikay AlarEric Cecilio Ohlrich, MD [facility]

## 2015-12-26 ENCOUNTER — Emergency Department
Admission: EM | Admit: 2015-12-26 | Discharge: 2015-12-26 | Disposition: A | Discharge status: Home / Self Care | Payer: Medicaid Other | Attending: Emergency Medicine | Admitting: Emergency Medicine

## 2015-12-26 ENCOUNTER — Telehealth: Payer: Self-pay | Admitting: Family Medicine

## 2015-12-26 ENCOUNTER — Emergency Department: Payer: Medicaid Other

## 2015-12-26 DIAGNOSIS — F329 Major depressive disorder, single episode, unspecified: Secondary | ICD-10-CM | POA: Diagnosis not present

## 2015-12-26 DIAGNOSIS — F17210 Nicotine dependence, cigarettes, uncomplicated: Secondary | ICD-10-CM | POA: Diagnosis not present

## 2015-12-26 DIAGNOSIS — B373 Candidiasis of vulva and vagina: Secondary | ICD-10-CM | POA: Diagnosis not present

## 2015-12-26 DIAGNOSIS — N760 Acute vaginitis: Secondary | ICD-10-CM | POA: Insufficient documentation

## 2015-12-26 DIAGNOSIS — Z79899 Other long term (current) drug therapy: Secondary | ICD-10-CM | POA: Diagnosis not present

## 2015-12-26 DIAGNOSIS — R112 Nausea with vomiting, unspecified: Secondary | ICD-10-CM | POA: Insufficient documentation

## 2015-12-26 DIAGNOSIS — R1084 Generalized abdominal pain: Secondary | ICD-10-CM | POA: Diagnosis present

## 2015-12-26 DIAGNOSIS — B3731 Acute candidiasis of vulva and vagina: Secondary | ICD-10-CM

## 2015-12-26 DIAGNOSIS — R197 Diarrhea, unspecified: Secondary | ICD-10-CM | POA: Diagnosis not present

## 2015-12-26 DIAGNOSIS — B9689 Other specified bacterial agents as the cause of diseases classified elsewhere: Secondary | ICD-10-CM

## 2015-12-26 LAB — URINALYSIS COMPLETE WITH MICROSCOPIC (ARMC ONLY)
BACTERIA UA: NONE SEEN
BILIRUBIN URINE: NEGATIVE
GLUCOSE, UA: NEGATIVE mg/dL
Hgb urine dipstick: NEGATIVE
LEUKOCYTES UA: NEGATIVE
Nitrite: NEGATIVE
Protein, ur: NEGATIVE mg/dL
RBC / HPF: NONE SEEN RBC/hpf (ref 0–5)
Specific Gravity, Urine: 1.021 (ref 1.005–1.030)
WBC, UA: NONE SEEN WBC/hpf (ref 0–5)
pH: 5 (ref 5.0–8.0)

## 2015-12-26 LAB — CBC WITH DIFFERENTIAL/PLATELET
BASOS ABS: 0.1 10*3/uL (ref 0–0.1)
Basophils Relative: 0 %
Eosinophils Absolute: 0.1 10*3/uL (ref 0–0.7)
Eosinophils Relative: 1 %
HEMATOCRIT: 45.5 % (ref 35.0–47.0)
HEMOGLOBIN: 14.9 g/dL (ref 12.0–16.0)
Lymphs Abs: 2 10*3/uL (ref 1.0–3.6)
MCH: 29.8 pg (ref 26.0–34.0)
MCHC: 32.8 g/dL (ref 32.0–36.0)
MCV: 90.9 fL (ref 80.0–100.0)
MONO ABS: 0.5 10*3/uL (ref 0.2–0.9)
NEUTROS ABS: 11.2 10*3/uL — AB (ref 1.4–6.5)
Platelets: 274 10*3/uL (ref 150–440)
RBC: 5 MIL/uL (ref 3.80–5.20)
RDW: 13.7 % (ref 11.5–14.5)
WBC: 13.9 10*3/uL — ABNORMAL HIGH (ref 3.6–11.0)

## 2015-12-26 LAB — COMPREHENSIVE METABOLIC PANEL
ALBUMIN: 4.9 g/dL (ref 3.5–5.0)
ALK PHOS: 39 U/L (ref 38–126)
ALT: 18 U/L (ref 14–54)
ANION GAP: 7 (ref 5–15)
AST: 18 U/L (ref 15–41)
BILIRUBIN TOTAL: 0.5 mg/dL (ref 0.3–1.2)
BUN: 12 mg/dL (ref 6–20)
CALCIUM: 9.7 mg/dL (ref 8.9–10.3)
CO2: 29 mmol/L (ref 22–32)
CREATININE: 1 mg/dL (ref 0.44–1.00)
Chloride: 102 mmol/L (ref 101–111)
GFR calc Af Amer: >60 mL/min (ref >60)
GFR calc non Af Amer: >60 mL/min (ref >60)
GLUCOSE: 120 mg/dL — AB (ref 65–99)
Potassium: 4.7 mmol/L (ref 3.5–5.1)
Sodium: 138 mmol/L (ref 135–145)
TOTAL PROTEIN: 7.5 g/dL (ref 6.5–8.1)

## 2015-12-26 LAB — LIPASE, BLOOD: Lipase: 31 U/L (ref 11–51)

## 2015-12-26 LAB — WET PREP BY MOLECULAR PROBE
CANDIDA SPECIES: POSITIVE — AB
GARDNERELLA VAGINALIS: POSITIVE — AB
Trichomonas vaginosis: NEGATIVE

## 2015-12-26 LAB — CERVICOVAGINAL ANCILLARY ONLY
Chlamydia: NEGATIVE
NEISSERIA GONORRHEA: NEGATIVE

## 2015-12-26 LAB — HSV(HERPES SIMPLEX VRS) I + II AB-IGG
HSV 1 GLYCOPROTEIN G AB, IGG: 48.1 {index} — AB (ref <0.90)
HSV 2 Glycoprotein G Ab, IgG: <0.9 Index (ref <0.90)

## 2015-12-26 LAB — PREGNANCY, URINE: PREG TEST UR: NEGATIVE

## 2015-12-26 MED ORDER — MORPHINE SULFATE (PF) 4 MG/ML IV SOLN
INTRAVENOUS | Status: AC
  Filled 2015-12-26: qty 1

## 2015-12-26 MED ORDER — ONDANSETRON HCL 4 MG/2ML IJ SOLN
4.0000 mg | Freq: Once | INTRAMUSCULAR | Status: AC
  Administered 2015-12-26: 4 mg via INTRAVENOUS
  Filled 2015-12-26: qty 2

## 2015-12-26 MED ORDER — METRONIDAZOLE 0.75 % VA GEL: 1.0000 | Freq: Every day | VAGINAL | Status: AC

## 2015-12-26 MED ORDER — SODIUM CHLORIDE 0.9 % IV BOLUS (SEPSIS)
1000.0000 mL | Freq: Once | INTRAVENOUS | Status: AC
  Administered 2015-12-26: 1000 mL via INTRAVENOUS

## 2015-12-26 MED ORDER — DICYCLOMINE HCL 20 MG PO TABS: 20.0000 mg | ORAL_TABLET | Freq: Three times a day (TID) | ORAL | Status: DC | PRN

## 2015-12-26 MED ORDER — FLUCONAZOLE 150 MG PO TABS: 150.0000 mg | ORAL_TABLET | Freq: Every day | ORAL | Status: DC

## 2015-12-26 MED ORDER — MORPHINE SULFATE (PF) 4 MG/ML IV SOLN
4.0000 mg | Freq: Once | INTRAVENOUS | Status: AC
  Administered 2015-12-26: 4 mg via INTRAVENOUS

## 2015-12-26 MED ORDER — RANITIDINE HCL 150 MG PO TABS: 150.0000 mg | ORAL_TABLET | Freq: Two times a day (BID) | ORAL | Status: DC

## 2015-12-26 MED ORDER — IOPAMIDOL (ISOVUE-300) INJECTION 61%
100.0000 mL | Freq: Once | INTRAVENOUS | Status: AC | PRN
  Administered 2015-12-26: 100 mL via INTRAVENOUS

## 2015-12-26 MED ORDER — DIATRIZOATE MEGLUMINE & SODIUM 66-10 % PO SOLN
15.0000 mL | Freq: Once | ORAL | Status: AC
  Administered 2015-12-26: 15 mL via ORAL

## 2015-12-26 MED ORDER — MORPHINE SULFATE (PF) 4 MG/ML IV SOLN
4.0000 mg | Freq: Once | INTRAVENOUS | Status: AC
  Administered 2015-12-26: 4 mg via INTRAVENOUS
  Filled 2015-12-26 (×2): qty 1

## 2015-12-26 NOTE — ED Notes (Signed)
Pt c/o epigastric pain and pain from mid to lower back for the past 3 days, states she began having N/V/D today

## 2015-12-26 NOTE — Telephone Encounter (Signed)
Spoke with patient she is having upper abdominal pain and mid back pain today. She had to leave work yesterday because of nausea. Patient has been vomiting today and the promethazine is not helping.

## 2015-12-26 NOTE — Discharge Instructions (Signed)
Please seek medical attention for any high fevers, chest pain, shortness of breath, change in behavior, persistent vomiting, bloody stool or any other new or concerning symptoms.   Abdominal Pain, Adult Many things can cause belly (abdominal) pain. Most times, the belly pain is not dangerous. Many cases of belly pain can be watched and treated at home. HOME CARE   Do not take medicines that help you go poop (laxatives) unless told to by your doctor.  Only take medicine as told by your doctor.  Eat or drink as told by your doctor. Your doctor will tell you if you should be on a special diet. GET HELP IF:  You do not know what is causing your belly pain.  You have belly pain while you are sick to your stomach (nauseous) or have runny poop (diarrhea).  You have pain while you pee or poop.  Your belly pain wakes you up at night.  You have belly pain that gets worse or better when you eat.  You have belly pain that gets worse when you eat fatty foods.  You have a fever. GET HELP RIGHT AWAY IF:   The pain does not go away within 2 hours.  You keep throwing up (vomiting).  The pain changes and is only in the right or left part of the belly.  You have bloody or tarry looking poop. MAKE SURE YOU:   Understand these instructions.  Will watch your condition.  Will get help right away if you are not doing well or get worse.   This information is not intended to replace advice given to you by your health care provider. Make sure you discuss any questions you have with your health care provider.   Document Released: 01/14/2008 Document Revised: 08/18/2014 Document Reviewed: 04/06/2013 Elsevier Interactive Patient Education 2016 [employer]  Bacterial Vaginosis Bacterial vaginosis is an infection of the vagina. It happens when too many germs (bacteria) grow in the vagina. Having this infection puts you at risk for getting other infections from sex. Treating this infection can  help lower your risk for other infections, such as:   Chlamydia.  Gonorrhea.  HIV.  Herpes. HOME CARE  Take your medicine as told by your doctor.  Finish your medicine even if you start to feel better.  Tell your sex partner that you have an infection. They should see their doctor for treatment.  During treatment:  Avoid sex or use condoms correctly.  Do not douche.  Do not drink alcohol unless your doctor tells you it is ok.  Do not breastfeed unless your doctor tells you it is ok. GET HELP IF:  You are not getting better after 3 days of treatment.  You have more grey fluid (discharge) coming from your vagina than before.  You have more pain than before.  You have a fever. MAKE SURE YOU:   Understand these instructions.  Will watch your condition.  Will get help right away if you are not doing well or get worse.   This information is not intended to replace advice given to you by your health care provider. Make sure you discuss any questions you have with your health care provider.   Document Released: 05/06/2008 Document Revised: 08/18/2014 Document Reviewed: 03/09/2013 Elsevier Interactive Patient Education 2016 [employer]  Monilial Vaginitis Vaginitis in a soreness, swelling and redness (inflammation) of the vagina and vulva. Monilial vaginitis is not a sexually transmitted infection. CAUSES  Yeast vaginitis is caused by yeast (candida) that  is normally found in your vagina. With a yeast infection, the candida has overgrown in number to a point that upsets the chemical balance. SYMPTOMS   White, thick vaginal discharge.  Swelling, itching, redness and irritation of the vagina and possibly the lips of the vagina (vulva).  Burning or painful urination.  Painful intercourse. DIAGNOSIS  Things that may contribute to monilial vaginitis are:  Postmenopausal and virginal states.  Pregnancy.  Infections.  Being tired, sick or stressed, especially  if you had monilial vaginitis in the past.  Diabetes. Good control will help lower the chance.  Birth control pills.  Tight fitting garments.  Using bubble bath, feminine sprays, douches or deodorant tampons.  Taking certain medications that kill germs (antibiotics).  Sporadic recurrence can occur if you become ill. TREATMENT  Your caregiver will give you medication.  There are several kinds of anti monilial vaginal creams and suppositories specific for monilial vaginitis. For recurrent yeast infections, use a suppository or cream in the vagina 2 times a week, or as directed.  Anti-monilial or steroid cream for the itching or irritation of the vulva may also be used. Get your caregiver's permission.  Painting the vagina with methylene blue solution may help if the monilial cream does not work.  Eating yogurt may help prevent monilial vaginitis. HOME CARE INSTRUCTIONS   Finish all medication as prescribed.  Do not have sex until treatment is completed or after your caregiver tells you it is okay.  Take warm sitz baths.  Do not douche.  Do not use tampons, especially scented ones.  Wear cotton underwear.  Avoid tight pants and panty hose.  Tell your sexual partner that you have a yeast infection. They should go to their caregiver if they have symptoms such as mild rash or itching.  Your sexual partner should be treated as well if your infection is difficult to eliminate.  Practice safer sex. Use condoms.  Some vaginal medications cause latex condoms to fail. Vaginal medications that harm condoms are:  Cleocin cream.  Butoconazole (Femstat).  Terconazole (Terazol) vaginal suppository.  Miconazole (Monistat) (may be purchased over the counter). SEEK MEDICAL CARE IF:   You have a temperature by mouth above 102 F (38.9 C).  The infection is getting worse after 2 days of treatment.  The infection is not getting better after 3 days of treatment.  You develop  blisters in or around your vagina.  You develop vaginal bleeding, and it is not your menstrual period.  You have pain when you urinate.  You develop intestinal problems.  You have pain with sexual intercourse.   This information is not intended to replace advice given to you by your health care provider. Make sure you discuss any questions you have with your health care provider.   Document Released: 05/07/2005 Document Revised: 10/20/2011 Document Reviewed: 01/29/2015 Elsevier Interactive Patient Education [employer]

## 2015-12-26 NOTE — ED Provider Notes (Addendum)
[facility] Emergency Department Provider Note   ____________________________________________  Time seen: Approximately 1040 AM  I have reviewed the triage vital signs and the nursing notes.   HISTORY  Chief Complaint Abdominal Pain    HPI Lori Miller is a 35 y.o. female with a history of abdominal pain who is presenting to the emergency department today with epigastric abdominal pain associated with nausea vomiting and diarrhea. She says that she has had back pain to her lower thoracic area over the past several days. This progressed to abdominal pain and epigastrium which is now constant and moderate and cramping. She says that she has had about 6 episodes of yellow vomitus this morning. One episode of diarrhea. No blood in the vomit or stool. Said that she is also recently had vaginal discharge and was examined by her PCP yesterday who discharged her with promethazine for nausea. Has not had any relief with the promethazine. York Spaniel that she had similar episodes about one year ago and was seen at this hospital and was discharged without a diagnosis. No history of sexually transmitted diseases. Her boyfriend is accompanying her and is at the bedside.Patient says she smokes cigarettes but does not drink or use any drugs. Says takes Xanax intermittently but says that she does not take it every day.   Past Medical History  Diagnosis Date  . Migraine   . Depression   . Anxiety     Patient Active Problem List   Diagnosis Date Noted  . Thoracic back pain 12/25/2015  . Low TSH level 11/25/2015  . Extremity edema 11/21/2015  . Cutaneous wart 11/21/2015  . Vaginal discharge 10/18/2015  . Viral illness 09/12/2015  . Tension headache 06/07/2015  . Leukocytosis 06/07/2015  . Irregular periods 05/23/2015  . Anxiety and depression 05/01/2015  . Migraine headache 05/01/2015  . Abdominal pain 05/01/2015  . Loss of weight 05/01/2015    Past Surgical  History  Procedure Laterality Date  . Cyst excision      Current Outpatient Rx  Name  Route  Sig  Dispense  Refill  . ALPRAZolam (XANAX) 0.5 MG tablet   Oral   Take 1 tablet (0.5 mg total) by mouth 3 (three) times daily as needed. for anxiety   90 tablet   0   . ALPRAZolam (XANAX) 0.5 MG tablet   Oral   Take 1 tablet (0.5 mg total) by mouth 3 (three) times daily as needed for anxiety. Do not fill until 12/21/15   90 tablet   0   . ALPRAZolam (XANAX) 0.5 MG tablet   Oral   Take 1 tablet (0.5 mg total) by mouth 3 (three) times daily as needed for anxiety. Do not fill until 01/21/16   90 tablet   0   . butalbital-acetaminophen-caffeine (FIORICET) 50-325-40 MG tablet   Oral   Take 1 tablet by mouth every 6 (six) hours as needed for headache or migraine.   10 tablet   0   . clindamycin (CLEOCIN) 100 MG vaginal suppository   Vaginal   Place 1 suppository (100 mg total) vaginally at bedtime.   3 suppository   0   . fluticasone (FLONASE) 50 MCG/ACT nasal spray   Each Nare   Place 2 sprays into both nostrils daily.   16 g   6   . loratadine (CLARITIN) 10 MG tablet   Oral   Take 1 tablet (10 mg total) by mouth daily.   30 tablet   11   .  promethazine (PHENERGAN) 12.5 MG tablet   Oral   Take 1 tablet (12.5 mg total) by mouth every 8 (eight) hours as needed for nausea or vomiting.   20 tablet   0     Allergies Flagyl  No family history on file.  Social History Social History  Substance Use Topics  . Smoking status: Current Every Day Smoker -- 0.50 packs/day    Types: Cigarettes  . Smokeless tobacco: None  . Alcohol Use: No    Review of Systems Constitutional: No fever/chills Eyes: No visual changes. ENT: No sore throat. Cardiovascular: Denies chest pain. Respiratory: Denies shortness of breath. Gastrointestinal: No diarrhea.  No constipation. Genitourinary: Negative for dysuria. Musculoskeletal: As above Skin: Negative for rash. Neurological:  Negative for headaches, focal weakness or numbness.  10-point ROS otherwise negative.  ____________________________________________   PHYSICAL EXAM:  VITAL SIGNS: ED Triage Vitals  Enc Vitals Group     BP 12/26/15 0948 117/69 mmHg     Pulse Rate 12/26/15 0948 66     Resp 12/26/15 0948 18     Temp 12/26/15 0948 97.8 F (36.6 C)     Temp Source 12/26/15 0948 Oral     SpO2 12/26/15 0948 98 %     Weight 12/26/15 0948 131 lb (59.421 kg)     Height 12/26/15 0948  (1.626 m)     Head Cir --      Peak Flow --      Pain Score 12/26/15 0949 8     Pain Loc --      Pain Edu? --      Excl. in GC? --     Constitutional: Alert and oriented. in no acute distress. Eyes: Conjunctivae are normal. PERRL. EOMI. Head: Atraumatic. Nose: No congestion/rhinnorhea. Mouth/Throat: Mucous membranes are moist.   Neck: No stridor.   Cardiovascular: Normal rate, regular rhythm. Grossly normal heart sounds.   Respiratory: Normal respiratory effort.  No retractions. Lungs CTAB. Gastrointestinal: Soft With epigastric tenderness palpation. Negative Murphy sign. No lower abdominal tenderness. No distention. No CVA tenderness. Musculoskeletal: No lower extremity tenderness nor edema.  No joint effusions. Neurologic:  Normal speech and language. No gross focal neurologic deficits are appreciated.  Skin:  Skin is warm, dry and intact. No rash noted. Psychiatric: Mood and affect are normal. Speech and behavior are normal.  ____________________________________________   LABS (all labs ordered are listed, but only abnormal results are displayed)  Labs Reviewed  CBC WITH DIFFERENTIAL/PLATELET - Abnormal; Notable for the following:    WBC 13.9 (*)    Neutro Abs 11.2 (*)    All other components within normal limits  COMPREHENSIVE METABOLIC PANEL - Abnormal; Notable for the following:    Glucose, Bld 120 (*)    All other components within normal limits  LIPASE, BLOOD  URINALYSIS COMPLETEWITH  MICROSCOPIC ([facility] ONLY)  PREGNANCY, URINE   ____________________________________________  EKG   ____________________________________________  RADIOLOGY   ____________________________________________   PROCEDURES    ____________________________________________   INITIAL IMPRESSION / ASSESSMENT AND PLAN / ED COURSE  Pertinent labs & imaging results that were available during my care of the patient were reviewed by me and considered in my medical decision making (see chart for details).  ----------------------------------------- 11:05 AM on 12/26/2015 -----------------------------------------  Wet prep resulted and positive for yeast as well as bacterial vaginosis.  ----------------------------------------- 3:13 PM on 12/26/2015 -----------------------------------------  Patient with elevated white blood cell count. Epigastric as well as superpubic tenderness to palpation but we'll proceed with CAT  scan. Patient's intolerance to Flagyl as nausea. I feel she will be amenable to metronidazole cream. Signed out to Dr. Derrill KayGoodman. ____________________________________________   FINAL CLINICAL IMPRESSION(S) / ED DIAGNOSES  Candida vaginalis. Bacterial vaginosis. Abdominal pain with nausea vomiting and diarrhea.    NEW MEDICATIONS STARTED DURING THIS VISIT:  New Prescriptions   No medications on file     Note:  This document was prepared using Dragon voice recognition software and may include unintentional dictation errors.    Myrna Blazeravid Matthew Weaver Tweed, MD 12/26/15 1515  Myrna Blazeravid Matthew Lumi Winslett, MD 12/26/15 [phone]

## 2015-12-26 NOTE — Telephone Encounter (Signed)
Patient went to ED

## 2015-12-26 NOTE — ED Notes (Signed)
Pt states saw her PCP yesterday for vaginal discharge and nausea. Pt states given a prescription for promethazine which has not helped.

## 2015-12-26 NOTE — Telephone Encounter (Signed)
Pt called about Vomiting started this morning but pain in back for three days. Pt does not want to come in. I offered her to come in and see Dr Adriana Simasook. Pt just wants a call back. Call pt @ [phone]. Thank you!

## 2015-12-26 NOTE — Telephone Encounter (Signed)
Returned call at number requested. No answer.

## 2015-12-26 NOTE — Telephone Encounter (Signed)
Given that patient has new symptoms I would recommend re-evaluation today particularly in the setting of upper abdominal pain and vomiting that could be related to a pancreatic issue or gall bladder issue. Thanks.

## 2015-12-26 NOTE — ED Provider Notes (Signed)
-----------------------------------------   4:02 PM on 12/26/2015 -----------------------------------------  CT abdomen and pelvis  IMPRESSION: Suspect recent ovarian cyst rupture on the right with fluid in the cul-de-sac seen.  Cervix is rather prominent. Significance of this finding uncertain. Direct visualization of the cervix is felt to be advisable.  No bowel obstruction or bowel wall thickening. No abscess. Appendix appears normal.  No renal or ureteral calculus. No hydronephrosis.  Uterus retroverted.  Atherosclerotic plaque noted in distal aorta. No abdominal aortic Aneurysm.   On my repeat abdominal exam at this time the abdomen is soft, mildly tender in the epigastric region. CT scan showed a possible recent ovarian cyst rupture. It also shows a rather prominent cervix. Patient did have a gynecologic exam performed yesterday, notes at that time did not state any cervical discoloration or CMT. Patient did however test positive for bacterial vaginosis and yeast infection. Will plan on giving patient medication for these diagnoses. Additionally will give Bentyl and an acid for abdominal pain. Will have patient follow-up with primary care.  Phineas SemenGraydon Livan Hires, MD 12/26/15 [phone]

## 2016-01-09 ENCOUNTER — Telehealth: Payer: Self-pay | Admitting: Family Medicine

## 2016-01-09 NOTE — Telephone Encounter (Signed)
Given patients complaints I would recommend evaluation today at a walk in clinic. I am happy to see her tomorrow for this if she refuses to be seen today.

## 2016-01-09 NOTE — Telephone Encounter (Signed)
Spoke with patient and she stated that she would like to wait and be seen by you tomorrow. I advised that if symptoms get worse that she should be seen today. Patient verbalized understanding

## 2016-01-09 NOTE — Telephone Encounter (Signed)
Patient is scheduled for an app[facility] on 6/1 [facility]

## 2016-01-09 NOTE — Telephone Encounter (Signed)
Pt called about being sick now she has not been able to sleep and she has a clorox taste in her mouth she cannot eat. Pt had got bite by tick in the month and the site of bite is red and it was swollen. Pt had found another tick bite in the middle of her back. She has chills and bad headaches.   I'm going to sch pt for an appt. Pt just wanted to let the nurse know.

## 2016-01-10 ENCOUNTER — Ambulatory Visit: Payer: Self-pay | Admitting: Family Medicine

## 2016-01-10 DIAGNOSIS — Z0289 Encounter for other administrative examinations: Secondary | ICD-10-CM

## 2016-01-17 ENCOUNTER — Ambulatory Visit: Payer: Self-pay | Admitting: Family Medicine

## 2016-02-05 ENCOUNTER — Telehealth: Payer: Self-pay | Admitting: Family Medicine

## 2016-02-05 NOTE — Telephone Encounter (Signed)
Patient will need to be seen.

## 2016-02-05 NOTE — Telephone Encounter (Signed)
Pt called in today. She would like to know if Dr. Birdie SonsSonnenberg wants to see her so she can have her prescription refilled in July.

## 2016-02-05 NOTE — Telephone Encounter (Signed)
Patient needs an appt. thanks 

## 2016-02-05 NOTE — Telephone Encounter (Signed)
Last appt with you was a no show on 6/1.  Please advise, I am not sure what meds she is wanting to get refilled.  Thanks

## 2016-02-07 ENCOUNTER — Other Ambulatory Visit: Payer: Self-pay | Admitting: *Deleted

## 2016-02-07 ENCOUNTER — Ambulatory Visit: Payer: Self-pay | Admitting: Family Medicine

## 2016-02-07 NOTE — Telephone Encounter (Signed)
Please advise refill, thanks 

## 2016-02-07 NOTE — Telephone Encounter (Signed)
Patient has requested a medication refill for Xanax

## 2016-02-07 NOTE — Telephone Encounter (Signed)
Patient needs an office visit for refill. She cancelled her appointment today.

## 2016-02-11 NOTE — Telephone Encounter (Signed)
Noted thanks °

## 2016-02-21 ENCOUNTER — Ambulatory Visit (INDEPENDENT_AMBULATORY_CARE_PROVIDER_SITE_OTHER): Payer: Medicaid Other | Admitting: Family Medicine

## 2016-02-21 ENCOUNTER — Encounter: Payer: Self-pay | Admitting: Family Medicine

## 2016-02-21 VITALS — BP 110/64 | HR 85 | Temp 98.6°F | Ht 64.0 in | Wt 130.2 lb

## 2016-02-21 DIAGNOSIS — F419 Anxiety disorder, unspecified: Secondary | ICD-10-CM | POA: Diagnosis not present

## 2016-02-21 DIAGNOSIS — F329 Major depressive disorder, single episode, unspecified: Secondary | ICD-10-CM

## 2016-02-21 DIAGNOSIS — F418 Other specified anxiety disorders: Secondary | ICD-10-CM

## 2016-02-21 DIAGNOSIS — M542 Cervicalgia: Secondary | ICD-10-CM | POA: Diagnosis not present

## 2016-02-21 MED ORDER — ALPRAZOLAM 0.5 MG PO TABS: 0.5000 mg | ORAL_TABLET | Freq: Three times a day (TID) | ORAL | Status: DC | PRN

## 2016-02-21 NOTE — Progress Notes (Signed)
Pre visit review using our clinic review tool, if applicable. No additional management support is needed unless otherwise documented below in the visit note. 

## 2016-02-21 NOTE — Patient Instructions (Signed)
Nice to see you. We will place a referral to a therapist. If you do not hear anything on x-ray please let us know. You can use heat for neck. He also use ibuprofen 600 mg every 8 hours as needed with food for discomfort. You can also do the following exercises. If he have discomfort with the exercises please stop them and let us know. If you develop numbness, weakness, worsening neck pain, or any thoughts of harming herself or others, or any new or changing symptoms please seek medical attention.  Cervical Strain and Sprain With Rehab Cervical strain and sprain are injuries that commonly occur with "whiplash" injuries. Whiplash occurs when the neck is forcefully whipped backward or forward, such as during a motor vehicle accident or during contact sports. The muscles, ligaments, tendons, discs, and nerves of the neck are susceptible to injury when this occurs. RISK FACTORS Risk of having a whiplash injury increases if:  Osteoarthritis of the spine.  Situations that make head or neck accidents or trauma more likely.  High-risk sports (football, rugby, wrestling, hockey, auto racing, gymnastics, diving, contact karate, or boxing).  Poor strength and flexibility of the neck.  Previous neck injury.  Poor tackling technique.  Improperly fitted or padded equipment. SYMPTOMS   Pain or stiffness in the front or back of neck or both.  Symptoms may present immediately or up to 24 hours after injury.  Dizziness, headache, nausea, and vomiting.  Muscle spasm with soreness and stiffness in the neck.  Tenderness and swelling at the injury site. PREVENTION  Learn and use proper technique (avoid tackling with the head, spearing, and head-butting; use proper falling techniques to avoid landing on the head).  Warm up and stretch properly before activity.  Maintain physical fitness:  Strength, flexibility, and endurance.  Cardiovascular fitness.  Wear properly fitted and padded protective  equipment, such as padded soft collars, for participation in contact sports. PROGNOSIS  Recovery from cervical strain and sprain injuries is dependent on the extent of the injury. These injuries are usually curable in 1 week to 3 months with appropriate treatment.  RELATED COMPLICATIONS   Temporary numbness and weakness may occur if the nerve roots are damaged, and this may persist until the nerve has completely healed.  Chronic pain due to frequent recurrence of symptoms.  Prolonged healing, especially if activity is resumed too soon (before complete recovery). TREATMENT  Treatment initially involves the use of ice and medication to help reduce pain and inflammation. It is also important to perform strengthening and stretching exercises and modify activities that worsen symptoms so the injury does not get worse. These exercises may be performed at home or with a therapist. For patients who experience severe symptoms, a soft, padded collar may be recommended to be worn around the neck.  Improving your posture may help reduce symptoms. Posture improvement includes pulling your chin and abdomen in while sitting or standing. If you are sitting, sit in a firm chair with your buttocks against the back of the chair. While sleeping, try replacing your pillow with a small towel rolled to 2 inches in diameter, or use a cervical pillow or soft cervical collar. Poor sleeping positions delay healing.  For patients with nerve root damage, which causes numbness or weakness, the use of a cervical traction apparatus may be recommended. Surgery is rarely necessary for these injuries. However, cervical strain and sprains that are present at birth (congenital) may require surgery. MEDICATION   If pain medication is necessary, nonsteroidal  anti-inflammatory medications, such as aspirin and ibuprofen, or other minor pain relievers, such as acetaminophen, are often recommended.  Do not take pain medication for 7 days  before surgery.  Prescription pain relievers may be given if deemed necessary by your caregiver. Use only as directed and only as much as you need. HEAT AND COLD:   Cold treatment (icing) relieves pain and reduces inflammation. Cold treatment should be applied for 10 to 15 minutes every 2 to 3 hours for inflammation and pain and immediately after any activity that aggravates your symptoms. Use ice packs or an ice massage.  Heat treatment may be used prior to performing the stretching and strengthening activities prescribed by your caregiver, physical therapist, or athletic trainer. Use a heat pack or a warm soak. SEEK MEDICAL CARE IF:   Symptoms get worse or do not improve in 2 weeks despite treatment.  New, unexplained symptoms develop (drugs used in treatment may produce side effects). EXERCISES RANGE OF MOTION (ROM) AND STRETCHING EXERCISES - Cervical Strain and Sprain These exercises may help you when beginning to rehabilitate your injury. In order to successfully resolve your symptoms, you must improve your posture. These exercises are designed to help reduce the forward-head and rounded-shoulder posture which contributes to this condition. Your symptoms may resolve with or without further involvement from your physician, physical therapist or athletic trainer. While completing these exercises, remember:   Restoring tissue flexibility helps normal motion to return to the joints. This allows healthier, less painful movement and activity.  An effective stretch should be held for at least 20 seconds, although you may need to begin with shorter hold times for comfort.  A stretch should never be painful. You should only feel a gentle lengthening or release in the stretched tissue. STRETCH- Axial Extensors  Lie on your back on the floor. You may bend your knees for comfort. Place a rolled-up hand towel or dish towel, about 2 inches in diameter, under the part of your head that makes contact  with the floor.  Gently tuck your chin, as if trying to make a "double chin," until you feel a gentle stretch at the base of your head.  Hold __________ seconds. Repeat __________ times. Complete this exercise __________ times per day.  STRETCH - Axial Extension   Stand or sit on a firm surface. Assume a good posture: chest up, shoulders drawn back, abdominal muscles slightly tense, knees unlocked (if standing) and feet hip width apart.  Slowly retract your chin so your head slides back and your chin slightly lowers. Continue to look straight ahead.  You should feel a gentle stretch in the back of your head. Be certain not to feel an aggressive stretch since this can cause headaches later.  Hold for __________ seconds. Repeat __________ times. Complete this exercise __________ times per day. STRETCH - Cervical Side Bend   Stand or sit on a firm surface. Assume a good posture: chest up, shoulders drawn back, abdominal muscles slightly tense, knees unlocked (if standing) and feet hip width apart.  Without letting your nose or shoulders move, slowly tip your right / left ear to your shoulder until your feel a gentle stretch in the muscles on the opposite side of your neck.  Hold __________ seconds. Repeat __________ times. Complete this exercise __________ times per day. STRETCH - Cervical Rotators   Stand or sit on a firm surface. Assume a good posture: chest up, shoulders drawn back, abdominal muscles slightly tense, knees unlocked (if standing) and  feet hip width apart.  Keeping your eyes level with the ground, slowly turn your head until you feel a gentle stretch along the back and opposite side of your neck.  Hold __________ seconds. Repeat __________ times. Complete this exercise __________ times per day. RANGE OF MOTION - Neck Circles   Stand or sit on a firm surface. Assume a good posture: chest up, shoulders drawn back, abdominal muscles slightly tense, knees unlocked (if  standing) and feet hip width apart.  Gently roll your head down and around from the back of one shoulder to the back of the other. The motion should never be forced or painful.  Repeat the motion 10-20 times, or until you feel the neck muscles relax and loosen. Repeat __________ times. Complete the exercise __________ times per day. STRENGTHENING EXERCISES - Cervical Strain and Sprain These exercises may help you when beginning to rehabilitate your injury. They may resolve your symptoms with or without further involvement from your physician, physical therapist, or athletic trainer. While completing these exercises, remember:   Muscles can gain both the endurance and the strength needed for everyday activities through controlled exercises.  Complete these exercises as instructed by your physician, physical therapist, or athletic trainer. Progress the resistance and repetitions only as guided.  You may experience muscle soreness or fatigue, but the pain or discomfort you are trying to eliminate should never worsen during these exercises. If this pain does worsen, stop and make certain you are following the directions exactly. If the pain is still present after adjustments, discontinue the exercise until you can discuss the trouble with your clinician. STRENGTH - Cervical Flexors, Isometric  Face a wall, standing about 6 inches away. Place a small pillow, a ball about 6-8 inches in diameter, or a folded towel between your forehead and the wall.  Slightly tuck your chin and gently push your forehead into the soft object. Push only with mild to moderate intensity, building up tension gradually. Keep your jaw and forehead relaxed.  Hold 10 to 20 seconds. Keep your breathing relaxed.  Release the tension slowly. Relax your neck muscles completely before you start the next repetition. Repeat __________ times. Complete this exercise __________ times per day. STRENGTH- Cervical Lateral Flexors,  Isometric   Stand about 6 inches away from a wall. Place a small pillow, a ball about 6-8 inches in diameter, or a folded towel between the side of your head and the wall.  Slightly tuck your chin and gently tilt your head into the soft object. Push only with mild to moderate intensity, building up tension gradually. Keep your jaw and forehead relaxed.  Hold 10 to 20 seconds. Keep your breathing relaxed.  Release the tension slowly. Relax your neck muscles completely before you start the next repetition. Repeat __________ times. Complete this exercise __________ times per day. STRENGTH - Cervical Extensors, Isometric   Stand about 6 inches away from a wall. Place a small pillow, a ball about 6-8 inches in diameter, or a folded towel between the back of your head and the wall.  Slightly tuck your chin and gently tilt your head back into the soft object. Push only with mild to moderate intensity, building up tension gradually. Keep your jaw and forehead relaxed.  Hold 10 to 20 seconds. Keep your breathing relaxed.  Release the tension slowly. Relax your neck muscles completely before you start the next repetition. Repeat __________ times. Complete this exercise __________ times per day. POSTURE AND BODY MECHANICS CONSIDERATIONS - Cervical  Strain and Sprain Keeping correct posture when sitting, standing or completing your activities will reduce the stress put on different body tissues, allowing injured tissues a chance to heal and limiting painful experiences. The following are general guidelines for improved posture. Your physician or physical therapist will provide you with any instructions specific to your needs. While reading these guidelines, remember:  The exercises prescribed by your provider will help you have the flexibility and strength to maintain correct postures.  The correct posture provides the optimal environment for your joints to work. All of your joints have less wear and  tear when properly supported by a spine with good posture. This means you will experience a healthier, less painful body.  Correct posture must be practiced with all of your activities, especially prolonged sitting and standing. Correct posture is as important when doing repetitive low-stress activities (typing) as it is when doing a single heavy-load activity (lifting). PROLONGED STANDING WHILE SLIGHTLY LEANING FORWARD When completing a task that requires you to lean forward while standing in one place for a long time, place either foot up on a stationary 2- to 4-inch high object to help maintain the best posture. When both feet are on the ground, the low back tends to lose its slight inward curve. If this curve flattens (or becomes too large), then the back and your other joints will experience too much stress, fatigue more quickly, and can cause pain.  RESTING POSITIONS Consider which positions are most painful for you when choosing a resting position. If you have pain with flexion-based activities (sitting, bending, stooping, squatting), choose a position that allows you to rest in a less flexed posture. You would want to avoid curling into a fetal position on your side. If your pain worsens with extension-based activities (prolonged standing, working overhead), avoid resting in an extended position such as sleeping on your stomach. Most people will find more comfort when they rest with their spine in a more neutral position, neither too rounded nor too arched. Lying on a non-sagging bed on your side with a pillow between your knees, or on your back with a pillow under your knees will often provide some relief. Keep in mind, being in any one position for a prolonged period of time, no matter how correct your posture, can still lead to stiffness. WALKING Walk with an upright posture. Your ears, shoulders, and hips should all line up. OFFICE WORK When working at a desk, create an environment that  supports good, upright posture. Without extra support, muscles fatigue and lead to excessive strain on joints and other tissues. CHAIR:  A chair should be able to slide under your desk when your back makes contact with the back of the chair. This allows you to work closely.  The chair's height should allow your eyes to be level with the upper part of your monitor and your hands to be slightly lower than your elbows.  Body position:  Your feet should make contact with the floor. If this is not possible, use a foot rest.  Keep your ears over your shoulders. This will reduce stress on your neck and low back.   This information is not intended to replace advice given to you by your health care provider. Make sure you discuss any questions you have with your health care provider.   Document Released: 07/28/2005 Document Revised: 08/18/2014 Document Reviewed: 11/09/2008 Elsevier Interactive Patient Education [employer]

## 2016-02-21 NOTE — Assessment & Plan Note (Addendum)
Patient with discomfort in her neck. Suspect osteoarthritis as cause given prior CT scan. Could be cervical muscle strain as well. Discussed continuing heat. Ibuprofen 600 mg every 8 hours as needed with food. Advised that if this upset her stomach she should stop and let us know. Exercises provided and advised only to do stretching and range of motion exercises. If any discomfort with exercises she is to stop them and let us know. Return precautions in AVS.

## 2016-02-21 NOTE — Assessment & Plan Note (Addendum)
This issue is stable. Xanax will be refilled. Referral to a therapist has been placed she did not hear anything regarding the prior referral. Discussed SSRIs though she declines this. Return precautions in AVS.
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
## 2016-03-13 ENCOUNTER — Telehealth: Payer: Self-pay | Admitting: *Deleted

## 2016-03-13 NOTE — Telephone Encounter (Signed)
Patient has requested to have her lab results from the past year printed out . She will need this for another appt Pt contact [phone]

## 2016-03-13 NOTE — Telephone Encounter (Signed)
Lori Miller can you assist, should I be printing all this or does it need to go to medical records? thanks

## 2016-03-28 ENCOUNTER — Telehealth: Payer: Self-pay | Admitting: *Deleted

## 2016-03-28 NOTE — Telephone Encounter (Signed)
Will Dr. Birdie SonsSonnenberg be ok with us sending this in.

## 2016-03-28 NOTE — Telephone Encounter (Signed)
Please advise 

## 2016-03-28 NOTE — Telephone Encounter (Signed)
Spoke with patient and advised that she would have to be seen. I explained that we do not have any appointments in office so will need to be seen at a walk in or urgent care

## 2016-03-28 NOTE — Telephone Encounter (Signed)
Patient states she knows she has a yeast infection and she was wondering if  Dr. Birdie SonsSonnenberg  can write a RX for Diflucan. Her pharmacy is [facility] just in case he does write her a prescription. Patient states she does not have the funds to come in for a OV and to get her prescription too. Patient is requesting a call back . Her number is [phone]. Please advise provider. Thanks

## 2016-04-30 ENCOUNTER — Ambulatory Visit (INDEPENDENT_AMBULATORY_CARE_PROVIDER_SITE_OTHER): Payer: Medicaid Other | Admitting: Family Medicine

## 2016-04-30 ENCOUNTER — Encounter: Payer: Self-pay | Admitting: Family Medicine

## 2016-04-30 VITALS — BP 98/64 | HR 91 | Temp 98.4°F | Ht 64.0 in | Wt 131.2 lb

## 2016-04-30 DIAGNOSIS — M542 Cervicalgia: Secondary | ICD-10-CM | POA: Diagnosis not present

## 2016-04-30 DIAGNOSIS — G8929 Other chronic pain: Secondary | ICD-10-CM

## 2016-04-30 DIAGNOSIS — F418 Other specified anxiety disorders: Secondary | ICD-10-CM | POA: Diagnosis not present

## 2016-04-30 DIAGNOSIS — F419 Anxiety disorder, unspecified: Secondary | ICD-10-CM

## 2016-04-30 DIAGNOSIS — F329 Major depressive disorder, single episode, unspecified: Secondary | ICD-10-CM

## 2016-04-30 DIAGNOSIS — B079 Viral wart, unspecified: Secondary | ICD-10-CM | POA: Diagnosis not present

## 2016-04-30 DIAGNOSIS — J0100 Acute maxillary sinusitis, unspecified: Secondary | ICD-10-CM

## 2016-04-30 MED ORDER — ALPRAZOLAM 0.5 MG PO TABS: 0.5000 mg | ORAL_TABLET | Freq: Three times a day (TID) | ORAL | 0 refills | Status: DC | PRN

## 2016-04-30 MED ORDER — AMOXICILLIN-POT CLAVULANATE 875-125 MG PO TABS: 1.0000 | ORAL_TABLET | Freq: Two times a day (BID) | ORAL | 0 refills | Status: DC

## 2016-04-30 NOTE — Assessment & Plan Note (Signed)
Unchanged. Xanax refilled. She notes she has not heard anything from the therapist. I will send a message to our referral coordinator to check on this.

## 2016-04-30 NOTE — Patient Instructions (Signed)
Nice to see you. Your upper respiratory symptoms could be related to a virus or sinus infection. I'll provide you with a prescription for an antibiotic if you're not improving over the next day. We will get an MRI of your neck to evaluate further given neck pain and numbness. You can try over-the-counter wart treatments for your warts on her hands. If you develop thoughts of harming herself, persistent numbness, weakness, worsening pain, fevers, cough productive of blood, or any new or changing symptoms medical attention.

## 2016-04-30 NOTE — Progress Notes (Signed)
Pre visit review using our clinic review tool, if applicable. No additional management support is needed unless otherwise documented below in the visit note. 

## 2016-04-30 NOTE — Assessment & Plan Note (Addendum)
Areas consistent with warts. Advised over-the-counter wart treatment.

## 2016-04-30 NOTE — Assessment & Plan Note (Signed)
Patient's upper respiratory symptoms likely related to sinusitis given the evident sinus congestion. Potentially could be viral though with fever could favor bacterial. She'll monitor for the next day and then fill the antibiotic if not improving at that time. Given return precautions.

## 2016-04-30 NOTE — Assessment & Plan Note (Signed)
Patient has continued to have discomfort in her neck. Worsening recently. Does get some numbness in her arms though I suspect this is most likely positional at night. Given numbness and worsening pain we'll obtain an MRI of her cervical spine to evaluate further and then determine the next step in management. Given return precautions.

## 2016-04-30 NOTE — Progress Notes (Signed)
Marikay AlarEric Loriana Samad, MD Phone: [phone]  Lewanda RifeSamantha G Gero is a 35 y.o. female who presents today for follow-up.  Sinus infection: Patient notes for the last 3 days she's had a sore throat, postnasal drip, significant maxillary and frontal sinus congestion, and cough productive of yellow mucus. Clear mucus out of her nose. She's had fever of 101.66F. She's not taking any medicines for this. Notes her boyfriend is sick with similar symptoms.  Anxiety: Notes this is about the same. She has been unable to get in touch with the therapist thus far. Some depression as well. No SI. Still uninterested in an SSRI. Taking Xanax 3 times daily.  Neck pain: Patient notes this has been chronic and unchanging. Has done the home exercises for her neck pain though the neck pain has been getting worse. She notes no weakness but does note at night her bilateral arms will occasionally go to sleep. She changes positions and they improved. Notes the pain is not constant. The numbness is intermittent and only at night. Has known arthritis in her neck.  Additionally has 2 warts on her hands. She wants know what to do for these.  PMH: Smoker   ROS see history of present illness  Objective  Physical Exam Vitals:   04/30/16 1503  BP: 98/64  Pulse: 91  Temp: 98.4 F (36.9 C)    BP Readings from Last 3 Encounters:  04/30/16 98/64  02/21/16 110/64  12/26/15 101/65   Wt Readings from Last 3 Encounters:  04/30/16 131 lb 3.7 oz (59.5 kg)  02/21/16 130 lb 3.2 oz (59.1 kg)  12/26/15 131 lb (59.4 kg)    Physical Exam  Constitutional: No distress.  HENT:  Head: Atraumatic.  Mouth/Throat: No oropharyngeal exudate.  Normal TMs bilaterally, mild oropharyngeal erythema  Eyes: Conjunctivae are normal. Pupils are equal, round, and reactive to light.  Neck: Neck supple.  Cardiovascular: Normal rate, regular rhythm and normal heart sounds.   Pulmonary/Chest: Effort normal and breath sounds normal.    Musculoskeletal:  No midline neck tenderness, mild right muscular neck tenderness posteriorly  Lymphadenopathy:    She has no cervical adenopathy.  Neurological: She is alert. Gait normal.  CN 2-12 intact, 5/5 strength in bilateral biceps, triceps, grip, quads, hamstrings, plantar and dorsiflexion, sensation to light touch intact in bilateral UE and LE, normal gait, 2+ patellar reflexes  Skin: Skin is warm and dry. She is not diaphoretic.  Single lesion on both hands consistent with common wart  Psychiatric: Mood and affect normal.     Assessment/Plan: Please see individual problem list.  Cutaneous wart Areas consistent with warts. Advised over-the-counter wart treatment.  Anxiety and depression Unchanged. Xanax refilled. She notes she has not heard anything from the therapist. I will send a message to our referral coordinator to check on this.  Cervical spine pain Patient has continued to have discomfort in her neck. Worsening recently. Does get some numbness in her arms though I suspect this is most likely positional at night. Given numbness and worsening pain we'll obtain an MRI of her cervical spine to evaluate further and then determine the next step in management. Given return precautions.  Sinusitis, acute, maxillary Patient's upper respiratory symptoms likely related to sinusitis given the evident sinus congestion. Potentially could be viral though with fever could favor bacterial. She'll monitor for the next day and then fill the antibiotic if not improving at that time. Given return precautions.   Orders Placed This Encounter  Procedures  . MR Cervical  Spine Wo Contrast    Standing Status:   Future    Standing Expiration Date:   06/30/2017    Order Specific Question:   Reason for Exam (SYMPTOM  OR DIAGNOSIS REQUIRED)    Answer:   neck pain chronically now with intermittent numbness in bilateral UE while sleeping    Order Specific Question:   Preferred imaging location?     Answer:   [facility] (table limit-300lbs)    Order Specific Question:   What is the patient's sedation requirement?    Answer:   No Sedation    Order Specific Question:   Does the patient have a pacemaker or implanted devices?    Answer:   No    [name], MD [facility]

## 2016-05-08 ENCOUNTER — Ambulatory Visit: Admission: RE | Admit: 2016-05-08 | Payer: Medicaid Other | Source: Ambulatory Visit

## 2016-05-15 ENCOUNTER — Telehealth: Payer: Self-pay | Admitting: Family Medicine

## 2016-05-15 NOTE — Telephone Encounter (Signed)
Pt called and stated that when she was here last with Dr. Birdie SonsSonnenberg her wrote her 3 prescptions for Xanax and he dated them for September, October, November and it should have been October, November, and December.  Also the day on the script has the 20 th and she usually picks them up on the 12 th. Does this make a difference for when she fills them?  Call pt @ [phone]

## 2016-05-19 NOTE — Telephone Encounter (Signed)
Spoke with patient and she is going to go to the pharmacy to try to get RX filled. She will let us know if she has any problems with it.

## 2016-05-24 ENCOUNTER — Emergency Department
Admission: EM | Admit: 2016-05-24 | Discharge: 2016-05-25 | Disposition: A | Discharge status: Home / Self Care | Payer: Medicaid Other | Attending: Emergency Medicine | Admitting: Emergency Medicine

## 2016-05-24 ENCOUNTER — Ambulatory Visit
Admission: RE | Admit: 2016-05-24 | Discharge: 2016-05-24 | Disposition: A | Discharge status: Home / Self Care | Payer: Medicaid Other | Source: Ambulatory Visit | Attending: Family Medicine | Admitting: Family Medicine

## 2016-05-24 ENCOUNTER — Other Ambulatory Visit: Payer: Self-pay

## 2016-05-24 DIAGNOSIS — R112 Nausea with vomiting, unspecified: Secondary | ICD-10-CM | POA: Diagnosis present

## 2016-05-24 DIAGNOSIS — T620X1A Toxic effect of ingested mushrooms, accidental (unintentional), initial encounter: Secondary | ICD-10-CM | POA: Insufficient documentation

## 2016-05-24 DIAGNOSIS — G8929 Other chronic pain: Secondary | ICD-10-CM | POA: Diagnosis present

## 2016-05-24 DIAGNOSIS — F17210 Nicotine dependence, cigarettes, uncomplicated: Secondary | ICD-10-CM | POA: Diagnosis not present

## 2016-05-24 DIAGNOSIS — M542 Cervicalgia: Secondary | ICD-10-CM | POA: Diagnosis present

## 2016-05-24 DIAGNOSIS — F1910 Other psychoactive substance abuse, uncomplicated: Secondary | ICD-10-CM | POA: Diagnosis not present

## 2016-05-24 DIAGNOSIS — M4802 Spinal stenosis, cervical region: Secondary | ICD-10-CM | POA: Diagnosis not present

## 2016-05-24 DIAGNOSIS — R1110 Vomiting, unspecified: Secondary | ICD-10-CM

## 2016-05-24 DIAGNOSIS — R197 Diarrhea, unspecified: Secondary | ICD-10-CM

## 2016-05-24 DIAGNOSIS — Z79899 Other long term (current) drug therapy: Secondary | ICD-10-CM | POA: Insufficient documentation

## 2016-05-24 LAB — URINE DRUG SCREEN, QUALITATIVE (ARMC ONLY)
AMPHETAMINES, UR SCREEN: NOT DETECTED
Barbiturates, Ur Screen: NOT DETECTED
Benzodiazepine, Ur Scrn: POSITIVE — AB
Cannabinoid 50 Ng, Ur ~~LOC~~: POSITIVE — AB
Cocaine Metabolite,Ur ~~LOC~~: NOT DETECTED
MDMA (ECSTASY) UR SCREEN: NOT DETECTED
Methadone Scn, Ur: NOT DETECTED
Opiate, Ur Screen: NOT DETECTED
Phencyclidine (PCP) Ur S: NOT DETECTED
TRICYCLIC, UR SCREEN: NOT DETECTED

## 2016-05-24 LAB — ETHANOL

## 2016-05-24 LAB — URINALYSIS COMPLETE WITH MICROSCOPIC (ARMC ONLY)
BACTERIA UA: NONE SEEN
Bilirubin Urine: NEGATIVE
Glucose, UA: NEGATIVE mg/dL
Hgb urine dipstick: NEGATIVE
LEUKOCYTES UA: NEGATIVE
NITRITE: NEGATIVE
PROTEIN: NEGATIVE mg/dL
Specific Gravity, Urine: 1.017 (ref 1.005–1.030)
pH: 6 (ref 5.0–8.0)

## 2016-05-24 LAB — COMPREHENSIVE METABOLIC PANEL
ALK PHOS: 44 U/L (ref 38–126)
ALT: 13 U/L — ABNORMAL LOW (ref 14–54)
ANION GAP: 12 (ref 5–15)
AST: 23 U/L (ref 15–41)
Albumin: 4.5 g/dL (ref 3.5–5.0)
BUN: 15 mg/dL (ref 6–20)
CALCIUM: 9.3 mg/dL (ref 8.9–10.3)
CO2: 22 mmol/L (ref 22–32)
Chloride: 104 mmol/L (ref 101–111)
Creatinine, Ser: 0.94 mg/dL (ref 0.44–1.00)
Glucose, Bld: 143 mg/dL — ABNORMAL HIGH (ref 65–99)
Potassium: 3.3 mmol/L — ABNORMAL LOW (ref 3.5–5.1)
SODIUM: 138 mmol/L (ref 135–145)
Total Bilirubin: 0.7 mg/dL (ref 0.3–1.2)
Total Protein: 7.2 g/dL (ref 6.5–8.1)

## 2016-05-24 LAB — CBC WITH DIFFERENTIAL/PLATELET
BASOS ABS: 0.1 10*3/uL (ref 0–0.1)
BASOS PCT: 1 %
EOS PCT: 0 %
Eosinophils Absolute: 0 10*3/uL (ref 0–0.7)
HCT: 40 % (ref 35.0–47.0)
Hemoglobin: 13.8 g/dL (ref 12.0–16.0)
Lymphocytes Relative: 20 %
Lymphs Abs: 3.2 10*3/uL (ref 1.0–3.6)
MCH: 30.9 pg (ref 26.0–34.0)
MCHC: 34.4 g/dL (ref 32.0–36.0)
MCV: 89.8 fL (ref 80.0–100.0)
MONO ABS: 0.2 10*3/uL (ref 0.2–0.9)
Monocytes Relative: 2 %
Neutro Abs: 12.8 10*3/uL — ABNORMAL HIGH (ref 1.4–6.5)
Neutrophils Relative %: 77 %
PLATELETS: 206 10*3/uL (ref 150–440)
RBC: 4.46 MIL/uL (ref 3.80–5.20)
RDW: 13.9 % (ref 11.5–14.5)
WBC: 16.3 10*3/uL — ABNORMAL HIGH (ref 3.6–11.0)

## 2016-05-24 LAB — SALICYLATE LEVEL

## 2016-05-24 LAB — LIPASE, BLOOD: LIPASE: 20 U/L (ref 11–51)

## 2016-05-24 LAB — ACETAMINOPHEN LEVEL

## 2016-05-24 MED ORDER — ONDANSETRON 4 MG PO TBDP: 4.0000 mg | ORAL_TABLET | Freq: Three times a day (TID) | ORAL | 0 refills | Status: DC | PRN

## 2016-05-24 MED ORDER — ONDANSETRON HCL 4 MG/2ML IJ SOLN
4.0000 mg | Freq: Once | INTRAMUSCULAR | Status: AC
  Administered 2016-05-24: 4 mg via INTRAVENOUS

## 2016-05-24 MED ORDER — SODIUM CHLORIDE 0.9 % IV BOLUS (SEPSIS)
1000.0000 mL | Freq: Once | INTRAVENOUS | Status: AC
  Administered 2016-05-24: 1000 mL via INTRAVENOUS

## 2016-05-24 NOTE — ED Notes (Signed)
EDP notified to POC pregnancy test negative.  EDP acknowledged.

## 2016-05-24 NOTE — ED Triage Notes (Signed)
Husband reports that approximately 2 hours ago, pt ate mushrooms.  Husband states that she picked them out of a field and started to eating them.  Husband is trying to find a picture of mushrooms in order to notify poison control.  Pt is moaning and responsive at this time.  Pt having gagging/dry heaves at this time.  EDP at bedside and ordered 4mg  IV zofran and 1L NS bolus at this time.

## 2016-05-24 NOTE — ED Notes (Signed)
Pt's neuro status now is appropriate and she answers all questions appropriately.  Pt stated that she ate the mushrooms to see what kind of "high" she could get from them.  Pt reports that she had never tried mushrooms before this instance.  Pt alert and in no acute distress at this time.

## 2016-05-24 NOTE — ED Provider Notes (Signed)
[facility] Emergency Department Provider Note  ____________________________________________  Time seen: Approximately 8:25 PM  I have reviewed the triage vital signs and the nursing notes.   HISTORY  Chief Complaint Ingestion Level 5 caveat:  Portions of the history and physical were unable to be obtained due to the patient's acute illness and altered mental status History obtained from husband    HPI Lori Miller is a 35 y.o. female brought to the ED by husband with nausea vomiting diarrhea and altered mental status for the past hour after eating foraged mushrooms 2 hours ago.  Patient ingested the mushrooms about 2 hours ago. Husband denies any concurrent ingestion or substance abuse of alcohol cocaine or heroin. No other drugs.     Past Medical History:  Diagnosis Date  . Anxiety   . Depression   . Migraine      Patient Active Problem List   Diagnosis Date Noted  . Sinusitis, acute, maxillary 04/30/2016  . Cervical spine pain 02/21/2016  . Thoracic back pain 12/25/2015  . Low TSH level 11/25/2015  . Extremity edema 11/21/2015  . Cutaneous wart 11/21/2015  . Vaginal discharge 10/18/2015  . Viral illness 09/12/2015  . Tension headache 06/07/2015  . Leukocytosis 06/07/2015  . Irregular periods 05/23/2015  . Anxiety and depression 05/01/2015  . Migraine headache 05/01/2015  . Abdominal pain 05/01/2015  . Loss of weight 05/01/2015     Past Surgical History:  Procedure Laterality Date  . CYST EXCISION       Prior to Admission medications   Medication Sig Start Date End Date Taking? Authorizing Provider  ALPRAZolam Prudy Feeler) 0.5 MG tablet Take 1 tablet (0.5 mg total) by mouth 3 (three) times daily as needed. for anxiety 04/30/16   Glori Luis, MD  ALPRAZolam Prudy Feeler) 0.5 MG tablet Take 1 tablet (0.5 mg total) by mouth 3 (three) times daily as needed for anxiety. Do not fill until 05/30/16 04/30/16   Glori Luis, MD   ALPRAZolam Prudy Feeler) 0.5 MG tablet Take 1 tablet (0.5 mg total) by mouth 3 (three) times daily as needed for anxiety. Do not fill until 06/30/16 04/30/16   Glori Luis, MD  amoxicillin-clavulanate (AUGMENTIN) 875-125 MG tablet Take 1 tablet by mouth 2 (two) times daily. 04/30/16   Glori Luis, MD  butalbital-acetaminophen-caffeine (FIORICET) [phone] MG tablet Take 1 tablet by mouth every 6 (six) hours as needed for headache or migraine. Patient not taking: Reported on 04/30/2016 12/06/15 12/05/16  Glori Luis, MD  dicyclomine (BENTYL) 20 MG tablet Take 1 tablet (20 mg total) by mouth 3 (three) times daily as needed (abdominal pain). Patient not taking: Reported on 04/30/2016 12/26/15   Phineas Semen, MD  ondansetron (ZOFRAN ODT) 4 MG disintegrating tablet Take 1 tablet (4 mg total) by mouth every 8 (eight) hours as needed for nausea or vomiting. 05/24/16   Sharman Cheek, MD     Allergies Flagyl [metronidazole]   No family history on file.  Social History Social History  Substance Use Topics  . Smoking status: Current Every Day Smoker    Packs/day: 0.50    Types: Cigarettes  . Smokeless tobacco: Not on file  . Alcohol use No    Review of Systems .Unable to obtain due to altered mental status  ____________________________________________   PHYSICAL EXAM:  VITAL SIGNS: ED Triage Vitals [05/24/16 2023]  Enc Vitals Group     BP      Pulse      Resp  Temp      Temp src      SpO2      Weight 130 lb (59 kg)     Height 5\' 4"  (1.626 m)     Head Circumference      Peak Flow      Pain Score      Pain Loc      Pain Edu?      Excl. in GC?     Vital signs reviewed, nursing assessments reviewed.   Constitutional:   Confused. Ill-appearing. Eyes:   No scleral icterus. No conjunctival pallor. PERRL. EOMI.  No nystagmus. ENT   Head:   Normocephalic and atraumatic.   Nose:   No congestion/rhinnorhea. No septal hematoma   Mouth/Throat:   MMM, no  pharyngeal erythema. No peritonsillar mass.    Neck:   No stridor. No SubQ emphysema. No meningismus. Hematological/Lymphatic/Immunilogical:   No cervical lymphadenopathy. Cardiovascular:   RRR. Symmetric bilateral radial and DP pulses.  No murmurs.  Respiratory:   Normal respiratory effort without tachypnea nor retractions. Breath sounds are clear and equal bilaterally. No wheezes/rales/rhonchi. Gastrointestinal:   Soft and nontender. Non distended. There is no CVA tenderness.  No rebound, rigidity, or guarding. Genitourinary:   deferred Musculoskeletal:   Nontender with normal range of motion in all extremities. No joint effusions.  No lower extremity tenderness.  No edema. Neurologic:   Normal speech and language.  CN 2-10 normal. Motor grossly intact. No gross focal neurologic deficits are appreciated.  Skin:    Skin is warm, dry and intact. No rash noted.  No petechiae, purpura, or bullae.  ____________________________________________    LABS (pertinent positives/negatives) (all labs ordered are listed, but only abnormal results are displayed) Labs Reviewed  COMPREHENSIVE METABOLIC PANEL - Abnormal; Notable for the following:       Result Value   Potassium 3.3 (*)    Glucose, Bld 143 (*)    ALT 13 (*)    All other components within normal limits  ACETAMINOPHEN LEVEL - Abnormal; Notable for the following:    Acetaminophen (Tylenol), Serum <10 (*)    All other components within normal limits  CBC WITH DIFFERENTIAL/PLATELET - Abnormal; Notable for the following:    WBC 16.3 (*)    Neutro Abs 12.8 (*)    All other components within normal limits  URINALYSIS COMPLETEWITH MICROSCOPIC ([facility] ONLY) - Abnormal; Notable for the following:    Color, Urine YELLOW (*)    APPearance CLEAR (*)    Ketones, ur TRACE (*)    Squamous Epithelial / LPF 0-5 (*)    All other components within normal limits  URINE DRUG SCREEN, QUALITATIVE ([facility] ONLY) - Abnormal; Notable for the following:     Cannabinoid 50 Ng, Ur [place] POSITIVE (*)    Benzodiazepine, Ur Scrn POSITIVE (*)    All other components within normal limits  ETHANOL  LIPASE, BLOOD  SALICYLATE LEVEL  POC URINE PREG, ED   ____________________________________________   EKG  Interpreted by me Normal sinus rhythm rate of 64, normal axis and intervals. Normal QRS ST segments and T waves. No terminal R 40.  ____________________________________________    RADIOLOGY    ____________________________________________   PROCEDURES Procedures  ____________________________________________   INITIAL IMPRESSION / ASSESSMENT AND PLAN / ED COURSE  Pertinent labs & imaging results that were available during my care of the patient were reviewed by me and considered in my medical decision making (see chart for details).  Patient presents with altered mental status  and GI symptoms after a reported mushroom ingestion. Her husband shows a photo of what she ingested but I'm unable to make an identification based on that. We'll monitor labs. Monitor respiratory status and mental status. IV fluids, IV Zofran.     Clinical Course    ----------------------------------------- 11:04 PM on 05/24/2016 -----------------------------------------  Patient awake and alert. Vital signs unremarkable. Tolerating oral intake. Sober. Workup negative except for positive for benzos from her Xanax and cannabinoids. She feels that her abdominal pain and vomiting and diarrhea have passed. Repeat abdominal exam is benign without any tenderness. Leukocytosis on exam is likely reactive to her vomiting and not indicative of any underlying infection. We'll complete a oral intake trial and then discharge home. ____________________________________________   FINAL CLINICAL IMPRESSION(S) / ED DIAGNOSES  Final diagnoses:  Vomiting and diarrhea  Mushroom poisoning syndrome, accidental or unintentional, initial encounter  Polysubstance abuse        Portions of this note were generated with dragon dictation software. Dictation errors may occur despite best attempts at proofreading.    Sharman CheekPhillip Vana Arif, MD 05/24/16 [phone]

## 2016-05-25 NOTE — ED Provider Notes (Signed)
-----------------------------------------   2:22 AM on 05/25/2016 -----------------------------------------   Blood pressure 123/65, resp. rate 15, height 5\' 4"  (1.626 m), weight 130 lb (59 kg), last menstrual period 04/24/2016.  Assuming care from Dr. Scotty CourtStafford.  In short, Lori Miller is a 35 y.o. female with a chief complaint of Ingestion .  Refer to the original H&P for additional details.  The current plan of care is to discharge.   As the patient was awaiting discharge poison control called with a concern of the mushroom ingestion. They report that they do not know what mushroom the patient ate and recommends a 6 hour hepatic function panel. We discussed this with the patient and she is willing to wait and have this blood work redrawn.   After waiting 3 hours to draw the hepatic function panel the patient decided she no longer wanted to wait to have her blood work redrawn. We informed the patient that we are unsure what kind of mushroom she ate and that with this being poison control's recommendation she would have to sign out AGAINST MEDICAL ADVICE. She was have to understand the risks of leaving including death from an unknown mushroom ingestion. The patient and her significant other except that risk and they will sign out AGAINST MEDICAL ADVICE.   Rebecka ApleyAllison P Webster, MD 05/25/16 [phone]

## 2016-05-25 NOTE — ED Notes (Signed)
Pt refused labs and decided to leave AMA.  EDP notified and poison control notified.

## 2016-05-29 ENCOUNTER — Other Ambulatory Visit: Payer: Self-pay | Admitting: Family Medicine

## 2016-05-29 DIAGNOSIS — M5030 Other cervical disc degeneration, unspecified cervical region: Secondary | ICD-10-CM

## 2016-06-20 ENCOUNTER — Ambulatory Visit: Payer: Self-pay

## 2016-07-01 ENCOUNTER — Emergency Department
Admission: EM | Admit: 2016-07-01 | Discharge: 2016-07-01 | Disposition: A | Discharge status: Home / Self Care | Payer: Medicaid Other | Attending: Emergency Medicine | Admitting: Emergency Medicine

## 2016-07-01 DIAGNOSIS — K529 Noninfective gastroenteritis and colitis, unspecified: Secondary | ICD-10-CM | POA: Insufficient documentation

## 2016-07-01 DIAGNOSIS — F17210 Nicotine dependence, cigarettes, uncomplicated: Secondary | ICD-10-CM | POA: Insufficient documentation

## 2016-07-01 DIAGNOSIS — Z79899 Other long term (current) drug therapy: Secondary | ICD-10-CM | POA: Diagnosis not present

## 2016-07-01 DIAGNOSIS — R197 Diarrhea, unspecified: Secondary | ICD-10-CM | POA: Diagnosis present

## 2016-07-01 LAB — COMPREHENSIVE METABOLIC PANEL
ALBUMIN: 4.8 g/dL (ref 3.5–5.0)
ALT: 14 U/L (ref 14–54)
ANION GAP: 8 (ref 5–15)
AST: 19 U/L (ref 15–41)
Alkaline Phosphatase: 32 U/L — ABNORMAL LOW (ref 38–126)
BUN: 11 mg/dL (ref 6–20)
CHLORIDE: 108 mmol/L (ref 101–111)
CO2: 28 mmol/L (ref 22–32)
Calcium: 9.4 mg/dL (ref 8.9–10.3)
Creatinine, Ser: 0.77 mg/dL (ref 0.44–1.00)
GFR calc Af Amer: >60 mL/min (ref >60)
GFR calc non Af Amer: >60 mL/min (ref >60)
GLUCOSE: 120 mg/dL — AB (ref 65–99)
POTASSIUM: 4.1 mmol/L (ref 3.5–5.1)
SODIUM: 144 mmol/L (ref 135–145)
Total Bilirubin: 0.6 mg/dL (ref 0.3–1.2)
Total Protein: 7.4 g/dL (ref 6.5–8.1)

## 2016-07-01 LAB — URINE DRUG SCREEN, QUALITATIVE (ARMC ONLY)
AMPHETAMINES, UR SCREEN: NOT DETECTED
Barbiturates, Ur Screen: NOT DETECTED
Benzodiazepine, Ur Scrn: POSITIVE — AB
CANNABINOID 50 NG, UR ~~LOC~~: POSITIVE — AB
COCAINE METABOLITE, UR ~~LOC~~: NOT DETECTED
MDMA (ECSTASY) UR SCREEN: NOT DETECTED
Methadone Scn, Ur: NOT DETECTED
Opiate, Ur Screen: NOT DETECTED
PHENCYCLIDINE (PCP) UR S: NOT DETECTED
TRICYCLIC, UR SCREEN: NOT DETECTED

## 2016-07-01 LAB — POCT PREGNANCY, URINE: Preg Test, Ur: NEGATIVE

## 2016-07-01 LAB — CBC
HCT: 38.5 % (ref 35.0–47.0)
HEMOGLOBIN: 13.3 g/dL (ref 12.0–16.0)
MCH: 30.5 pg (ref 26.0–34.0)
MCHC: 34.5 g/dL (ref 32.0–36.0)
MCV: 88.6 fL (ref 80.0–100.0)
Platelets: 223 10*3/uL (ref 150–440)
RBC: 4.34 MIL/uL (ref 3.80–5.20)
RDW: 14.6 % — ABNORMAL HIGH (ref 11.5–14.5)
WBC: 7.5 10*3/uL (ref 3.6–11.0)

## 2016-07-01 LAB — ETHANOL: Alcohol, Ethyl (B): <5 mg/dL (ref <5)

## 2016-07-01 MED ORDER — ONDANSETRON 4 MG PO TBDP
4.0000 mg | ORAL_TABLET | Freq: Once | ORAL | Status: AC
  Administered 2016-07-01: 4 mg via ORAL

## 2016-07-01 MED ORDER — SODIUM CHLORIDE 0.9 % IV BOLUS (SEPSIS)
1000.0000 mL | Freq: Once | INTRAVENOUS | Status: AC
  Administered 2016-07-01: 1000 mL via INTRAVENOUS

## 2016-07-01 MED ORDER — ONDANSETRON 4 MG PO TBDP
ORAL_TABLET | ORAL | Status: AC
  Filled 2016-07-01: qty 1

## 2016-07-01 MED ORDER — LORAZEPAM 1 MG PO TABS
1.0000 mg | ORAL_TABLET | Freq: Once | ORAL | Status: AC
  Administered 2016-07-01: 1 mg via ORAL
  Filled 2016-07-01: qty 1

## 2016-07-01 MED ORDER — ALPRAZOLAM 0.5 MG PO TABS: 0.5000 mg | ORAL_TABLET | Freq: Three times a day (TID) | ORAL | 0 refills | Status: DC | PRN

## 2016-07-01 NOTE — ED Notes (Signed)
Patient reports doubling her prescribed dose of xanax this month and now she is out. Patient reports being in jail over the weekend so her last dose of xanax was possibly Friday.

## 2016-07-01 NOTE — ED Notes (Signed)
Patient given [place] sandwich. Ok per Dr Alphonzo LemmingsMcShane.

## 2016-07-01 NOTE — Discharge Instructions (Signed)
Turn to the emergency room for any new or worrisome symptoms follow closely with your doctor. Only take medications as prescribed

## 2016-07-01 NOTE — ED Provider Notes (Addendum)
[facility] Emergency Department Provider Note  ____________________________________________   I have reviewed the triage vital signs and the nursing notes.   HISTORY  Chief Complaint Withdrawal    HPI Lori Miller is a 35 y.o. female presents today complaining of "nausea and vomiting and loose stool" since yesterday. Patient does have a history of chronic benzodiazepine use, she last used it 2 days ago. It was prescribed for which she's been using extra because she has been stressed out. No SI or HI or intentional overdose. Patient was in jail over the weekend. When she came out she began to feel bad had some nausea vomiting and diarrhea. She wonders if this is because she is withdrawing from her benzos. She does state that she can get a refill of her benzos but not for another week. She goes to a physician who writes for it.    Past Medical History:  Diagnosis Date  . Anxiety   . Depression   . Migraine     Patient Active Problem List   Diagnosis Date Noted  . Sinusitis, acute, maxillary 04/30/2016  . Cervical spine pain 02/21/2016  . Thoracic back pain 12/25/2015  . Low TSH level 11/25/2015  . Extremity edema 11/21/2015  . Cutaneous wart 11/21/2015  . Vaginal discharge 10/18/2015  . Viral illness 09/12/2015  . Tension headache 06/07/2015  . Leukocytosis 06/07/2015  . Irregular periods 05/23/2015  . Anxiety and depression 05/01/2015  . Migraine headache 05/01/2015  . Abdominal pain 05/01/2015  . Loss of weight 05/01/2015    Past Surgical History:  Procedure Laterality Date  . BUNIONECTOMY    . CYST EXCISION      Prior to Admission medications   Medication Sig Start Date End Date Taking? Authorizing Provider  ALPRAZolam Prudy Feeler(XANAX) 0.5 MG tablet Take 1 tablet (0.5 mg total) by mouth 3 (three) times daily as needed. for anxiety 04/30/16   Glori LuisEric G Sonnenberg, MD  ALPRAZolam Prudy Feeler(XANAX) 0.5 MG tablet Take 1 tablet (0.5 mg total) by mouth 3  (three) times daily as needed for anxiety. Do not fill until 05/30/16 04/30/16   Glori LuisEric G Sonnenberg, MD  ALPRAZolam Prudy Feeler(XANAX) 0.5 MG tablet Take 1 tablet (0.5 mg total) by mouth 3 (three) times daily as needed for anxiety. Do not fill until 06/30/16 04/30/16   Glori LuisEric G Sonnenberg, MD  amoxicillin-clavulanate (AUGMENTIN) 875-125 MG tablet Take 1 tablet by mouth 2 (two) times daily. 04/30/16   Glori LuisEric G Sonnenberg, MD  butalbital-acetaminophen-caffeine (FIORICET) [phone] MG tablet Take 1 tablet by mouth every 6 (six) hours as needed for headache or migraine. Patient not taking: Reported on 04/30/2016 12/06/15 12/05/16  Glori LuisEric G Sonnenberg, MD  dicyclomine (BENTYL) 20 MG tablet Take 1 tablet (20 mg total) by mouth 3 (three) times daily as needed (abdominal pain). Patient not taking: Reported on 04/30/2016 12/26/15   Phineas SemenGraydon Goodman, MD  ondansetron (ZOFRAN ODT) 4 MG disintegrating tablet Take 1 tablet (4 mg total) by mouth every 8 (eight) hours as needed for nausea or vomiting. 05/24/16   Sharman CheekPhillip Stafford, MD    Allergies Flagyl [metronidazole]  No family history on file.  Social History Social History  Substance Use Topics  . Smoking status: Current Every Day Smoker    Packs/day: 0.50    Types: Cigarettes  . Smokeless tobacco: Never Used  . Alcohol use No    Review of Systems Constitutional: No fever/chills Eyes: No visual changes. ENT: No sore throat. No stiff neck no neck pain Cardiovascular: Denies chest  pain. Respiratory: Denies shortness of breath. Gastrointestinal:   Refer to history of present illness Genitourinary: Negative for dysuria. Musculoskeletal: Negative lower extremity swelling Skin: Negative for rash. Neurological: Negative for severe headaches, focal weakness or numbness. 10-point ROS otherwise negative.  ____________________________________________   PHYSICAL EXAM:  VITAL SIGNS: ED Triage Vitals  Enc Vitals Group     BP 07/01/16 1311 110/70     Pulse Rate 07/01/16  1311 82     Resp 07/01/16 1311 18     Temp 07/01/16 1311 98.5 F (36.9 C)     Temp Source 07/01/16 1311 Oral     SpO2 07/01/16 1311 97 %     Weight 07/01/16 1312 123 lb (55.8 kg)     Height 07/01/16 1312 5\' 4"  (1.626 m)     Head Circumference --      Peak Flow --      Pain Score 07/01/16 1312 6     Pain Loc --      Pain Edu? --      Excl. in GC? --     Constitutional: Alert and oriented. Well appearing and in no acute distress. Eyes: Conjunctivae are normal. PERRL. EOMI. Head: Atraumatic. Nose: No congestion/rhinnorhea. Mouth/Throat: Mucous membranes are moist.  Oropharynx non-erythematous. Neck: No stridor.   Nontender with no meningismus Cardiovascular: Normal rate, regular rhythm. Grossly normal heart sounds.  Good peripheral circulation. Respiratory: Normal respiratory effort.  No retractions. Lungs CTAB. Abdominal: Soft and nontender. No distention. No guarding no rebound Back:  There is no focal tenderness or step off.  there is no midline tenderness there are no lesions noted. there is no CVA tenderness Musculoskeletal: No lower extremity tenderness, no upper extremity tenderness. No joint effusions, no DVT signs strong distal pulses no edema Neurologic:  Normal speech and language. No gross focal neurologic deficits are appreciated.  Skin:  Skin is warm, dry and intact. No rash noted. Psychiatric: Mood and affect are normal. Speech and behavior are normal.  ____________________________________________   LABS (all labs ordered are listed, but only abnormal results are displayed)  Labs Reviewed  COMPREHENSIVE METABOLIC PANEL - Abnormal; Notable for the following:       Result Value   Glucose, Bld 120 (*)    Alkaline Phosphatase 32 (*)    All other components within normal limits  CBC - Abnormal; Notable for the following:    RDW 14.6 (*)    All other components within normal limits  URINE DRUG SCREEN, QUALITATIVE ([facility] ONLY) - Abnormal; Notable for the following:     Cannabinoid 50 Ng, Ur [place] POSITIVE (*)    Benzodiazepine, Ur Scrn POSITIVE (*)    All other components within normal limits  ETHANOL  POCT PREGNANCY, URINE   ____________________________________________  EKG  I personally interpreted any EKGs ordered by me or triage  ____________________________________________  RADIOLOGY  I reviewed any imaging ordered by me or triage that were performed during my shift and, if possible, patient and/or family made aware of any abnormal findings. ____________________________________________   PROCEDURES  Procedure(s) performed: None  Procedures  Critical Care performed: None  ____________________________________________   INITIAL IMPRESSION / ASSESSMENT AND PLAN / ED COURSE  Pertinent labs & imaging results that were available during my care of the patient were reviewed by me and considered in my medical decision making (see chart for details).  No evidence of significant benzodiazepine overdose or withdrawal. Patient is awake and alert. Abdomen is benign. The normal findings noted. She is not tachycardic  or tremulous. No evidence of impending seizure. Patient is here for benzodiazepines. I have given her one initially to forestall possible withdrawal. Obviously, patient is using her medications in a unprescribed manner. This is of some danger. I will not give her more than enough benzos to keep her until tomorrow. At that time she'll follow up with her doctor. I do not wish to give her no prescription is I'm concerned about the possibility of withdrawal.    Clinical Course    ____________________________________________   FINAL CLINICAL IMPRESSION(S) / ED DIAGNOSES  Final diagnoses:  None      This chart was dictated using voice recognition software.  Despite best efforts to proofread,  errors can occur which can change meaning.      Jeanmarie PlantJames A McShane, MD 07/01/16 1725    Jeanmarie PlantJames A McShane, MD 07/01/16 [phone]

## 2016-07-01 NOTE — ED Triage Notes (Signed)
Pt thinks she is going there xanax withdrawal, states she was Rx xanax 0.5 TID, states this month she was doubling up on them and ran out yesterday.. States she began having N/V/D today with abd cramping..Marland Kitchen

## 2016-07-02 ENCOUNTER — Telehealth: Payer: Self-pay | Admitting: Family Medicine

## 2016-07-02 DIAGNOSIS — F419 Anxiety disorder, unspecified: Secondary | ICD-10-CM

## 2016-07-02 NOTE — Telephone Encounter (Signed)
Called and spoke with patient after reviewing her ED note. She reports her nausea and vomiting and diarrhea are improved. She reports she has refills of Xanax available to her. She reports she has not heard from the psychologist or psychiatrist. We will replace referrals for evaluation. We will someone contact her next week to get her set up a follow-up with me.

## 2016-07-07 NOTE — Telephone Encounter (Signed)
It can be scheduled for just follow up. It can be put on 11:15 on 07/15/16 for 30 minutes.

## 2016-07-07 NOTE — Telephone Encounter (Signed)
Should this be scheduled as a hospital follow up?

## 2016-07-07 NOTE — Telephone Encounter (Signed)
Can you please call and schedule patient for follow up. Thank you

## 2016-07-14 ENCOUNTER — Telehealth: Payer: Self-pay

## 2016-07-14 NOTE — Telephone Encounter (Signed)
Attempted to complete a PA for Alprazolam.  After calling [place] [facility], only Brnad name Xanax is covered with her plan.  Spoke with [facility] to have them run it as brand name only.  They stated they will try. thanks

## 2016-07-31 ENCOUNTER — Ambulatory Visit (INDEPENDENT_AMBULATORY_CARE_PROVIDER_SITE_OTHER): Payer: Medicaid Other | Admitting: Family Medicine

## 2016-07-31 ENCOUNTER — Encounter: Payer: Self-pay | Admitting: Family Medicine

## 2016-07-31 DIAGNOSIS — Z23 Encounter for immunization: Secondary | ICD-10-CM

## 2016-07-31 MED ORDER — ALPRAZOLAM 0.5 MG PO TABS: ORAL_TABLET | ORAL | 0 refills | Status: DC

## 2016-07-31 NOTE — Patient Instructions (Signed)
Nice to see you. We are going to taper you off of your Xanax given that you are trying to conceive. Please follow the directions on the bottle. We'll get you in to see psychiatry. If you develop worsening anxiety, jitteriness, sweatiness, shakiness, nausea, vomiting, or any new or changing symptoms please seek medical attention immediately.

## 2016-07-31 NOTE — Assessment & Plan Note (Signed)
Continues with some issues with anxiety. Not as much depression anymore. Given that she is trying to conceive we are going to taper her off of Xanax given the risks of this medication. LMP 07/14/16. She is going to stop and talk with the referral coordinator regarding an appointment with a psychiatrist to help manage this issue. Discussed withdrawal precautions. If these occur she will be evaluated immediately.

## 2016-07-31 NOTE — Progress Notes (Signed)
  Lori AlarEric Cynda Soule, MD Phone: [phone]  Lori Miller is a 35 y.o. female who presents today for follow-up.  Patient notes her anxiety and depression is about the same. Taking Xanax 3 times daily. Notes it is mostly anxiety. Anything can make her anxious. She lost her job again recently. Also got locked up in jail. Some mild depression and sadness. No SI or HI. Has not heard anything regarding the psychiatrist. Patient does note today that she is trying to conceive. We discussed the risk of being on Xanax with this.   ROS see history of present illness  Objective  Physical Exam Vitals:   07/31/16 1056  BP: 102/62  Pulse: 77  Temp: 98.7 F (37.1 C)    BP Readings from Last 3 Encounters:  07/31/16 102/62  07/01/16 108/78  05/25/16 (!) 97/51   Wt Readings from Last 3 Encounters:  07/31/16 129 lb (58.5 kg)  07/01/16 123 lb (55.8 kg)  05/24/16 130 lb (59 kg)    Physical Exam  Constitutional: No distress.  Cardiovascular: Normal rate, regular rhythm and normal heart sounds.   Pulmonary/Chest: Effort normal and breath sounds normal.  Neurological: She is alert. Gait normal.  Skin: She is not diaphoretic.  Psychiatric:  Mood anxious, affect normal     Assessment/Plan: Please see individual problem list.  Anxiety and depression Continues with some issues with anxiety. Not as much depression anymore. Given that she is trying to conceive we are going to taper her off of Xanax given the risks of this medication. LMP 07/14/16. She is going to stop and talk with the referral coordinator regarding an appointment with a psychiatrist to help manage this issue. Discussed withdrawal precautions. If these occur she will be evaluated immediately.   Orders Placed This Encounter  Procedures  . Flu Vaccine QUAD 36+ mos IM    Lori AlarEric Kolbee Stallman, MD [facility] [place]

## 2016-07-31 NOTE — Progress Notes (Signed)
Pre visit review using our clinic review tool, if applicable. No additional management support is needed unless otherwise documented below in the visit note. 

## 2016-08-01 ENCOUNTER — Emergency Department: Payer: Medicaid Other

## 2016-08-01 ENCOUNTER — Emergency Department
Admission: EM | Admit: 2016-08-01 | Discharge: 2016-08-01 | Disposition: A | Discharge status: Home / Self Care | Payer: Medicaid Other | Attending: Emergency Medicine | Admitting: Emergency Medicine

## 2016-08-01 ENCOUNTER — Encounter: Payer: Self-pay | Admitting: Emergency Medicine

## 2016-08-01 DIAGNOSIS — M7989 Other specified soft tissue disorders: Secondary | ICD-10-CM | POA: Diagnosis present

## 2016-08-01 DIAGNOSIS — M545 Low back pain, unspecified: Secondary | ICD-10-CM

## 2016-08-01 DIAGNOSIS — F17210 Nicotine dependence, cigarettes, uncomplicated: Secondary | ICD-10-CM | POA: Insufficient documentation

## 2016-08-01 DIAGNOSIS — R609 Edema, unspecified: Secondary | ICD-10-CM | POA: Diagnosis not present

## 2016-08-01 DIAGNOSIS — Z79899 Other long term (current) drug therapy: Secondary | ICD-10-CM | POA: Insufficient documentation

## 2016-08-01 LAB — URINE DRUG SCREEN, QUALITATIVE (ARMC ONLY)
Amphetamines, Ur Screen: NOT DETECTED
BARBITURATES, UR SCREEN: NOT DETECTED
BENZODIAZEPINE, UR SCRN: NOT DETECTED
CANNABINOID 50 NG, UR ~~LOC~~: POSITIVE — AB
Cocaine Metabolite,Ur ~~LOC~~: NOT DETECTED
MDMA (Ecstasy)Ur Screen: NOT DETECTED
METHADONE SCREEN, URINE: NOT DETECTED
OPIATE, UR SCREEN: NOT DETECTED
Phencyclidine (PCP) Ur S: NOT DETECTED
TRICYCLIC, UR SCREEN: NOT DETECTED

## 2016-08-01 LAB — COMPREHENSIVE METABOLIC PANEL
ALK PHOS: 39 U/L (ref 38–126)
ALT: 17 U/L (ref 14–54)
AST: 27 U/L (ref 15–41)
Albumin: 4.1 g/dL (ref 3.5–5.0)
Anion gap: 7 (ref 5–15)
BUN: 12 mg/dL (ref 6–20)
CALCIUM: 9.4 mg/dL (ref 8.9–10.3)
CO2: 28 mmol/L (ref 22–32)
CREATININE: 0.84 mg/dL (ref 0.44–1.00)
Chloride: 102 mmol/L (ref 101–111)
Glucose, Bld: 110 mg/dL — ABNORMAL HIGH (ref 65–99)
Potassium: 3.9 mmol/L (ref 3.5–5.1)
Sodium: 137 mmol/L (ref 135–145)
Total Bilirubin: 0.3 mg/dL (ref 0.3–1.2)
Total Protein: 6.6 g/dL (ref 6.5–8.1)

## 2016-08-01 LAB — CBC WITH DIFFERENTIAL/PLATELET
Basophils Absolute: 0.1 10*3/uL (ref 0–0.1)
Basophils Relative: 1 %
Eosinophils Absolute: 0.1 10*3/uL (ref 0–0.7)
Eosinophils Relative: 2 %
HCT: 38.4 % (ref 35.0–47.0)
HEMOGLOBIN: 13.2 g/dL (ref 12.0–16.0)
LYMPHS ABS: 2.6 10*3/uL (ref 1.0–3.6)
LYMPHS PCT: 36 %
MCH: 30.5 pg (ref 26.0–34.0)
MCHC: 34.4 g/dL (ref 32.0–36.0)
MCV: 88.8 fL (ref 80.0–100.0)
Monocytes Absolute: 0.4 10*3/uL (ref 0.2–0.9)
Monocytes Relative: 5 %
NEUTROS ABS: 4 10*3/uL (ref 1.4–6.5)
NEUTROS PCT: 56 %
Platelets: 213 10*3/uL (ref 150–440)
RBC: 4.32 MIL/uL (ref 3.80–5.20)
RDW: 14.7 % — ABNORMAL HIGH (ref 11.5–14.5)
WBC: 7.1 10*3/uL (ref 3.6–11.0)

## 2016-08-01 LAB — PREGNANCY, URINE: Preg Test, Ur: NEGATIVE

## 2016-08-01 LAB — URINALYSIS, COMPLETE (UACMP) WITH MICROSCOPIC
Bacteria, UA: NONE SEEN
Bilirubin Urine: NEGATIVE
GLUCOSE, UA: NEGATIVE mg/dL
Hgb urine dipstick: NEGATIVE
Ketones, ur: NEGATIVE mg/dL
Leukocytes, UA: NEGATIVE
Nitrite: NEGATIVE
PH: 6 (ref 5.0–8.0)
PROTEIN: NEGATIVE mg/dL
RBC / HPF: NONE SEEN RBC/hpf (ref 0–5)
Specific Gravity, Urine: 1.001 — ABNORMAL LOW (ref 1.005–1.030)

## 2016-08-01 MED ORDER — KETOROLAC TROMETHAMINE 30 MG/ML IJ SOLN
30.0000 mg | Freq: Once | INTRAMUSCULAR | Status: AC
  Administered 2016-08-01: 30 mg via INTRAMUSCULAR
  Filled 2016-08-01: qty 1

## 2016-08-01 MED ORDER — DICLOFENAC SODIUM 3 % TD GEL: 1.0000 "application " | Freq: Two times a day (BID) | TRANSDERMAL | 0 refills | Status: DC | PRN

## 2016-08-01 NOTE — ED Notes (Signed)
Patient transported to Ultrasound 

## 2016-08-01 NOTE — ED Notes (Signed)
Pt c/o lower back pain and swelling to bilat lower extremeties, + pedal pulses. Pt denies any hx of fluid retention. Pt ambulatory to RM. Pt states lower extremities are sore, denies any injuries.

## 2016-08-01 NOTE — ED Provider Notes (Signed)
[facility] Emergency Department Provider Note  ____________________________________________   First MD Initiated Contact with Patient 08/01/16 1550     (approximate)  I have reviewed the triage vital signs and the nursing notes.   HISTORY  Chief Complaint Leg Swelling   HPI Lori Miller is a 35 y.o. female  with a history of migraines as well as depression is present emergency department today with lumbar pain as well as bilateral lower summary swelling with right greater than left. Says that the swelling started yesterday and appears smaller up to her thighs. She's never gets one this before. She says the swelling seems to run down during the day today. She denies any long car trips or plane trips lately. Denies any history of DVT. Denies any IV drug use. The patient says that she smokes but does not a history of chronic drinking or drug use. Says that she has had issues with her cervical spine in the past but never her lumbar back. Denies any weakness to the bilateral lower extremities. Does not report any loss of bowel or bladder continence per as is the back pain is ongoing for about 2 weeks at this time and feels a sharp pain that "catches" when she moves.  She says that she has been prescribed baclofen in the past for her neck pain but says that this does not work anymore. Past Medical History:  Diagnosis Date  . Anxiety   . Depression   . Migraine     Patient Active Problem List   Diagnosis Date Noted  . Sinusitis, acute, maxillary 04/30/2016  . Cervical spine pain 02/21/2016  . Thoracic back pain 12/25/2015  . Low TSH level 11/25/2015  . Extremity edema 11/21/2015  . Cutaneous wart 11/21/2015  . Vaginal discharge 10/18/2015  . Viral illness 09/12/2015  . Tension headache 06/07/2015  . Leukocytosis 06/07/2015  . Irregular periods 05/23/2015  . Anxiety and depression 05/01/2015  . Migraine headache 05/01/2015  . Abdominal pain  05/01/2015  . Loss of weight 05/01/2015    Past Surgical History:  Procedure Laterality Date  . BUNIONECTOMY    . CYST EXCISION      Prior to Admission medications   Medication Sig Start Date End Date Taking? Authorizing Provider  ALPRAZolam Prudy Feeler(XANAX) 0.5 MG tablet Take 0.5 mg by mouth twice daily for 7 days, then decrease to 0.5 mg by mouth daily for 7 days, then decrease to 0.5 mg by mouth every other day for 7 days, then discontinue 07/31/16   Glori LuisEric G Sonnenberg, MD  baclofen (LIORESAL) 10 MG tablet Take 10 mg by mouth 3 (three) times daily.    Historical Provider, MD    Allergies Flagyl [metronidazole]  No family history on file.  Social History Social History  Substance Use Topics  . Smoking status: Current Every Day Smoker    Packs/day: 0.50    Types: Cigarettes  . Smokeless tobacco: Never Used  . Alcohol use No    Review of Systems Constitutional: No fever/chills Eyes: No visual changes. ENT: No sore throat. Cardiovascular: Denies chest pain. Respiratory: Denies shortness of breath. Gastrointestinal: No abdominal pain.  No nausea, no vomiting.  No diarrhea.  No constipation. Genitourinary: Negative for dysuria. Musculoskeletal: As above Skin: Negative for rash. Neurological: Negative for focal weakness or numbness.  10-point ROS otherwise negative.  ____________________________________________   PHYSICAL EXAM:  VITAL SIGNS: ED Triage Vitals [08/01/16 1511]  Enc Vitals Group     BP (!) 124/55  Pulse Rate 78     Resp 16     Temp 98.4 F (36.9 C)     Temp Source Oral     SpO2 99 %     Weight 129 lb (58.5 kg)     Height 5\' 4"  (1.626 m)     Head Circumference      Peak Flow      Pain Score 8     Pain Loc      Pain Edu?      Excl. in GC?     Constitutional: Alert and oriented. Well appearing and in no acute distress. Eyes: Conjunctivae are normal. PERRL. EOMI. Head: Atraumatic. Nose: No congestion/rhinnorhea. Mouth/Throat: Mucous membranes  are moist.   Neck: No stridor.   Cardiovascular: Normal rate, regular rhythm. Grossly normal heart sounds.   Respiratory: Normal respiratory effort.  No retractions. Lungs CTAB. Gastrointestinal: Soft and nontender. No distention.  Musculoskeletal: Minimal lower 70 edema bilaterally up to the mid calf. Bilateral and intact dorsalis pedis pulses.  Mild/moderate tenderness to palpation diffusely of the lumbar region. No deformity or step-off or bruising to the lumbar region. Neurologic:  Normal speech and language. No gross focal neurologic deficits are appreciated.  Skin:  Skin is warm, dry and intact. No rash noted. Psychiatric: Mood and affect are normal. Speech and behavior are normal.  ____________________________________________   LABS (all labs ordered are listed, but only abnormal results are displayed)  Labs Reviewed  URINALYSIS, COMPLETE (UACMP) WITH MICROSCOPIC - Abnormal; Notable for the following:       Result Value   Color, Urine STRAW (*)    APPearance CLEAR (*)    Specific Gravity, Urine 1.001 (*)    Squamous Epithelial / LPF 6-30 (*)    All other components within normal limits  CBC WITH DIFFERENTIAL/PLATELET - Abnormal; Notable for the following:    RDW 14.7 (*)    All other components within normal limits  COMPREHENSIVE METABOLIC PANEL - Abnormal; Notable for the following:    Glucose, Bld 110 (*)    All other components within normal limits  URINE DRUG SCREEN, QUALITATIVE ([facility] ONLY) - Abnormal; Notable for the following:    Cannabinoid 50 Ng, Ur [place] POSITIVE (*)    All other components within normal limits  PREGNANCY, URINE  POC URINE PREG, ED   ____________________________________________  EKG   ____________________________________________  RADIOLOGY    US Venous Img Lower Bilateral (Final result)  Result time 08/01/16 17:37:25  Final result by Alcide Clever, MD (08/01/16 17:37:25)           Narrative:   CLINICAL DATA: Leg swelling for 2 days,  initial encounter  EXAM: BILATERAL LOWER EXTREMITY VENOUS DOPPLER ULTRASOUND  TECHNIQUE: Gray-scale sonography with graded compression, as well as color Doppler and duplex ultrasound were performed to evaluate the lower extremity deep venous systems from the level of the common femoral vein and including the common femoral, femoral, profunda femoral, popliteal and calf veins including the posterior tibial, peroneal and gastrocnemius veins when visible. The superficial great saphenous vein was also interrogated. Spectral Doppler was utilized to evaluate flow at rest and with distal augmentation maneuvers in the common femoral, femoral and popliteal veins.  COMPARISON: None.  FINDINGS: RIGHT LOWER EXTREMITY  Common Femoral Vein: No evidence of thrombus. Normal compressibility, respiratory phasicity and response to augmentation.  Saphenofemoral Junction: No evidence of thrombus. Normal compressibility and flow on color Doppler imaging.  Profunda Femoral Vein: No evidence of thrombus. Normal compressibility and flow  on color Doppler imaging.  Femoral Vein: No evidence of thrombus. Normal compressibility, respiratory phasicity and response to augmentation.  Popliteal Vein: No evidence of thrombus. Normal compressibility, respiratory phasicity and response to augmentation.  Calf Veins: No evidence of thrombus. Normal compressibility and flow on color Doppler imaging.  Superficial Great Saphenous Vein: No evidence of thrombus. Normal compressibility and flow on color Doppler imaging.  Venous Reflux: None.  Other Findings: None.  LEFT LOWER EXTREMITY  Common Femoral Vein: No evidence of thrombus. Normal compressibility, respiratory phasicity and response to augmentation.  Saphenofemoral Junction: No evidence of thrombus. Normal compressibility and flow on color Doppler imaging.  Profunda Femoral Vein: No evidence of thrombus. Normal compressibility and flow on color  Doppler imaging.  Femoral Vein: No evidence of thrombus. Normal compressibility, respiratory phasicity and response to augmentation.  Popliteal Vein: No evidence of thrombus. Normal compressibility, respiratory phasicity and response to augmentation.  Calf Veins: No evidence of thrombus. Normal compressibility and flow on color Doppler imaging.  Superficial Great Saphenous Vein: No evidence of thrombus. Normal compressibility and flow on color Doppler imaging.  Venous Reflux: None.  Other Findings: None.  IMPRESSION: No evidence of deep venous thrombosis bilaterally.   Electronically Signed By: Alcide CleverMark Lukens M.D. On: 08/01/2016 17:36          ____________________________________________   PROCEDURES  Procedure(s) performed:   Procedures  Critical Care performed:   ____________________________________________   INITIAL IMPRESSION / ASSESSMENT AND PLAN / ED COURSE  Pertinent labs & imaging results that were available during my care of the patient were reviewed by me and considered in my medical decision making (see chart for details).   Clinical Course   ----------------------------------------- 6:04 PM on 08/01/2016 -----------------------------------------  Patient with reassuring workup. We discussed symptomatic treatment for her peripheral edema including keeping her legs elevated and using compression stockings. She says that she has used compression stockings in the past. She says that she is now remembering that she had a similar issue which was transient, lasting only about 1 day in the past. However, this is also lasted for only 1 day at this time. Unclear cause of the back pain. However, there are no objective signs of spinal compression. The patient is able to ambulate with a normal gait without any difficulty. Patient denies any IV drug use. Denies any injections in her back such as steroid injections. No fevers reported. Normal white blood cell  count. We'll discharge with Alexander BergeronPaul Teran gel. She will follow up with her primary care doctor.   ____________________________________________   FINAL CLINICAL IMPRESSION(S) / ED DIAGNOSES  Peripheral edema. Low back pain.    NEW MEDICATIONS STARTED DURING THIS VISIT:  New Prescriptions   No medications on file     Note:  This document was prepared using Dragon voice recognition software and may include unintentional dictation errors.    Myrna Blazeravid Matthew Nathyn Luiz, MD 08/01/16 [phone]

## 2016-08-01 NOTE — ED Triage Notes (Signed)
Patient to ED via POV c/o bilateral leg swelling, right  greater than left. Patient also c/o pain in both legs. Patient reports that legs have "redish tent" to them. Patient also c/o lower back pain.

## 2016-08-08 ENCOUNTER — Other Ambulatory Visit: Payer: Self-pay | Admitting: Family Medicine

## 2016-08-08 ENCOUNTER — Telehealth: Payer: Self-pay

## 2016-08-08 MED ORDER — ALPRAZOLAM 0.5 MG PO TABS: 0.5000 mg | ORAL_TABLET | Freq: Three times a day (TID) | ORAL | 0 refills | Status: DC | PRN

## 2016-08-08 NOTE — Telephone Encounter (Signed)
"  Dr tried to tried to take me off Xanax and it's not working, sweating, can't sleep, he wanted to take me off because. I was trying to conceive. But I am by myself.and back home and anxiety is out of the roof.  I spoke with patient and she states she can't catch her breath"  She denies harming herself or others.   She states she has to be back on her Xanax its the only thing that will keep herself sane.   She can't give me an exact pill count.   Dr Adriana Simasook agreed to write her script for Xanax. She agreed to make follow up appointment with Dr Birdie SonsSonnenberg to discus anxiety.

## 2016-08-14 ENCOUNTER — Ambulatory Visit: Payer: Medicaid Other | Admitting: Family Medicine

## 2016-08-15 ENCOUNTER — Ambulatory Visit (INDEPENDENT_AMBULATORY_CARE_PROVIDER_SITE_OTHER): Payer: Medicaid Other | Admitting: Family Medicine

## 2016-08-15 ENCOUNTER — Encounter: Payer: Self-pay | Admitting: Family Medicine

## 2016-08-15 DIAGNOSIS — F418 Other specified anxiety disorders: Secondary | ICD-10-CM | POA: Diagnosis not present

## 2016-08-15 DIAGNOSIS — F419 Anxiety disorder, unspecified: Principal | ICD-10-CM

## 2016-08-15 DIAGNOSIS — F329 Major depressive disorder, single episode, unspecified: Secondary | ICD-10-CM

## 2016-08-15 MED ORDER — ALPRAZOLAM 0.5 MG PO TABS: 0.5000 mg | ORAL_TABLET | Freq: Three times a day (TID) | ORAL | 0 refills | Status: DC | PRN

## 2016-08-15 NOTE — Progress Notes (Signed)
Pre visit review using our clinic review tool, if applicable. No additional management support is needed unless otherwise documented below in the visit note. 

## 2016-08-15 NOTE — Progress Notes (Signed)
  Marikay AlarEric Cherlyn Syring, MD Phone: [phone]  [name] is a 36 y.o. female who presents today for follow-up.  Patient was tapered down on Xanax but did not tolerate this. Notes her anxiety went through the roof. She's had a lot of stressors recently. Has multiple court dates. Note she moved home with her parents. She's felt on edge. She's no longer trying to conceive. Not interested in any forms of birth control other than condoms. She feels better since being placed back on the Xanax. No depression. Taking Xanax 3 times a day.  ROS see history of present illness  Objective  Physical Exam Vitals:   08/15/16 1501  BP: (!) 100/58  Pulse: 69  Temp: 98.5 F (36.9 C)    BP Readings from Last 3 Encounters:  08/15/16 (!) 100/58  08/01/16 120/76  07/31/16 102/62   Wt Readings from Last 3 Encounters:  08/15/16 126 lb 3.2 oz (57.2 kg)  08/01/16 129 lb (58.5 kg)  07/31/16 129 lb (58.5 kg)    Physical Exam  Constitutional: No distress.  Cardiovascular: Normal rate, regular rhythm and normal heart sounds.   Pulmonary/Chest: Effort normal and breath sounds normal.  Skin: She is not diaphoretic.  Psychiatric:  Patient reports significant anxiety though none today, affect appears normal today     Assessment/Plan: Please see individual problem list.  Anxiety and depression No depression today. Significant anxiety with tapering off of Xanax. We will refill this. Discussed that if she is sexually active she needs to use condoms. We will check with the referral coordinator on the status of her referral to psychiatry.   No orders of the defined types were placed in this encounter.   Meds ordered this encounter  Medications  . ALPRAZolam (XANAX) 0.5 MG tablet    Sig: Take 1 tablet (0.5 mg total) by mouth 3 (three) times daily as needed for anxiety.    Dispense:  90 tablet    Refill:  0    Marikay AlarEric Cage Gupton, MD [facility] [place]

## 2016-08-15 NOTE — Patient Instructions (Signed)
Nice to see you. I have refilled your Xanax. If you're sexually active you need to use condoms. We will try to dig into see the psychologist and psychiatrist.

## 2016-08-15 NOTE — Assessment & Plan Note (Signed)
No depression today. Significant anxiety with tapering off of Xanax. We will refill this. Discussed that if she is sexually active she needs to use condoms. We will check with the referral coordinator on the status of her referral to psychiatry.

## 2016-08-18 ENCOUNTER — Telehealth: Payer: Self-pay | Admitting: *Deleted

## 2016-08-18 NOTE — Progress Notes (Signed)
Patient notified

## 2016-08-18 NOTE — Progress Notes (Signed)
lmtrc

## 2016-08-18 NOTE — Telephone Encounter (Signed)
See other message

## 2016-08-18 NOTE — Telephone Encounter (Signed)
Patient stated that she missed a call, please call pt at [phone]

## 2016-09-08 ENCOUNTER — Other Ambulatory Visit: Payer: Self-pay | Admitting: Family Medicine

## 2016-09-08 MED ORDER — ALPRAZOLAM 0.5 MG PO TABS: 0.5000 mg | ORAL_TABLET | Freq: Three times a day (TID) | ORAL | 0 refills | Status: DC | PRN

## 2016-09-08 NOTE — Telephone Encounter (Signed)
Patient states she has tried to call but has not received a call back, informed patient this will be the last refill

## 2016-09-08 NOTE — Telephone Encounter (Signed)
Pt called requesting a refill on ALPRAZolam (XANAX) 0.5 MG tablet. Please advise, thank you!  Pharmacy -  [facility] [place] - [address]  Call pt @ [phone]

## 2016-09-08 NOTE — Telephone Encounter (Signed)
Left message to return call 

## 2016-09-08 NOTE — Telephone Encounter (Signed)
Please see if the patient contacted the psychiatrists office. [facility] If she has not contacted them this will be the last refill she gets if she does not get in touch with them and schedule an appointment in the future.

## 2016-09-08 NOTE — Telephone Encounter (Signed)
Last filled  08/15/2016 90 0rf last OV 08/15/16

## 2016-09-08 NOTE — Telephone Encounter (Signed)
[facility]'ll [facility] to [facility] with [facility] again. Please give the patient the prescription.

## 2016-10-10 ENCOUNTER — Telehealth: Payer: Self-pay | Admitting: Family Medicine

## 2016-10-10 MED ORDER — ALPRAZOLAM 0.5 MG PO TABS: 0.5000 mg | ORAL_TABLET | Freq: Three times a day (TID) | ORAL | 0 refills | Status: DC | PRN

## 2016-10-10 NOTE — Telephone Encounter (Signed)
Pt came into office this morning. I made a copy of her appt cards, paper is located in Dr. Purvis SheffieldSonnenberg's color folder. She went to her first appt as requested by Dr. Birdie SonsSonnenberg. [facility] May [facility]. She is out of her medication and needs a refill. Medication she is requesting refilled is ALPRAZolam (XANAX) 0.5 MG tablet . Pt's phone  Number is [phone].

## 2016-10-10 NOTE — Telephone Encounter (Signed)
Refilled per Dr.Sonnenberg, patient picked up rx

## 2016-10-28 ENCOUNTER — Other Ambulatory Visit: Payer: Self-pay | Admitting: Family Medicine

## 2016-10-28 NOTE — Telephone Encounter (Signed)
Last OV 08/15/16 last filled 10/10/16 90 0rf

## 2016-10-28 NOTE — Telephone Encounter (Signed)
Please find out if the patient has seen psychiatry yet. If she has I will need to get notes from them. Also this request is early as this was last filled 18 days ago. Please find out how many pills she has left then I will determine whether the refill is appropriate. Thanks.

## 2016-10-28 NOTE — Telephone Encounter (Signed)
Pt called requesting a refill on her ALPRAZolam (XANAX) 0.5 MG tablet. Please advise, thank you!  [facility] [place] - [phone]  Call pt @ [phone]

## 2016-10-29 MED ORDER — ALPRAZOLAM 0.5 MG PO TABS: 0.5000 mg | ORAL_TABLET | Freq: Three times a day (TID) | ORAL | 0 refills | Status: DC | PRN

## 2016-10-29 NOTE — Telephone Encounter (Signed)
Patient states she has seen psychiatry and her appointment is may 17th. She states she has 4 days left of medication.

## 2016-10-29 NOTE — Telephone Encounter (Signed)
It appears that the patient has likely been taking more than prescribed given that she only has 4 days of medication left and she was provided with a 30 day supply. I will refill this though if she requests this early again we will taper her off of it and she can follow up with psychiatry regarding this.

## 2016-10-29 NOTE — Telephone Encounter (Signed)
faxed
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
## 2016-12-03 ENCOUNTER — Other Ambulatory Visit: Payer: Self-pay | Admitting: Family Medicine

## 2016-12-03 NOTE — Telephone Encounter (Signed)
Last OV 08/15/16 last filled 10/29/16 90 0rf

## 2016-12-03 NOTE — Telephone Encounter (Signed)
Please fax

## 2016-12-04 NOTE — Telephone Encounter (Signed)
faxed
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
## 2017-01-09 ENCOUNTER — Other Ambulatory Visit: Payer: Self-pay | Admitting: Family Medicine

## 2017-01-09 NOTE — Telephone Encounter (Signed)
Tried calling patient x2.

## 2017-01-09 NOTE — Telephone Encounter (Signed)
Refill can be faxed. She was supposed to see psychiatry in May. Please find out if she has seen them yet. If she has they need to refill this in the future. If she has not I will provide a tapering dose for her in the future. Thanks.

## 2017-01-09 NOTE — Telephone Encounter (Signed)
Last OV 08/15/16 please advise ok to fill?

## 2017-01-09 NOTE — Telephone Encounter (Signed)
[place] drug database reviewed by me and pharmacy contacted by CMA. Has not had Xanax refilled since last filled by me. It appears that she received Klonopin at some point last month from another provider. We have attempted to contact the patient twice today and have been unable to get in touch with her. Given concern for withdrawal if she were to be without benzodiazepines we will fax in a short-term refill and try to contact her next week. She will need to follow-up in the office with us. We will determine needs to take over her medication at that time.

## 2017-01-12 ENCOUNTER — Other Ambulatory Visit: Payer: Self-pay

## 2017-01-12 NOTE — Telephone Encounter (Signed)
Tried calling, no vm 

## 2017-01-12 NOTE — Telephone Encounter (Signed)
Unable to reach patient, sent letter to contact the office. 

## 2017-02-26 ENCOUNTER — Inpatient Hospital Stay
Admission: AD | Admit: 2017-02-26 | Discharge: 2017-03-04 | DRG: 881 | Disposition: A | Discharge status: Home / Self Care | Payer: Medicaid Other | Source: Intra-hospital | Attending: Psychiatry | Admitting: Psychiatry

## 2017-02-26 ENCOUNTER — Encounter: Payer: Self-pay | Admitting: Emergency Medicine

## 2017-02-26 ENCOUNTER — Emergency Department
Admission: EM | Admit: 2017-02-26 | Discharge: 2017-02-26 | Disposition: A | Discharge status: Other Acute Inpatient Hospital | Payer: Medicaid Other | Attending: Emergency Medicine | Admitting: Emergency Medicine

## 2017-02-26 DIAGNOSIS — R509 Fever, unspecified: Secondary | ICD-10-CM

## 2017-02-26 DIAGNOSIS — F1420 Cocaine dependence, uncomplicated: Secondary | ICD-10-CM

## 2017-02-26 DIAGNOSIS — F1410 Cocaine abuse, uncomplicated: Secondary | ICD-10-CM | POA: Diagnosis not present

## 2017-02-26 DIAGNOSIS — Z62810 Personal history of physical and sexual abuse in childhood: Secondary | ICD-10-CM | POA: Diagnosis present

## 2017-02-26 DIAGNOSIS — F4325 Adjustment disorder with mixed disturbance of emotions and conduct: Secondary | ICD-10-CM

## 2017-02-26 DIAGNOSIS — G4700 Insomnia, unspecified: Secondary | ICD-10-CM | POA: Diagnosis present

## 2017-02-26 DIAGNOSIS — F13939 Sedative, hypnotic or anxiolytic use, unspecified with withdrawal, unspecified: Secondary | ICD-10-CM

## 2017-02-26 DIAGNOSIS — F331 Major depressive disorder, recurrent, moderate: Secondary | ICD-10-CM | POA: Diagnosis not present

## 2017-02-26 DIAGNOSIS — K219 Gastro-esophageal reflux disease without esophagitis: Secondary | ICD-10-CM | POA: Diagnosis present

## 2017-02-26 DIAGNOSIS — F17200 Nicotine dependence, unspecified, uncomplicated: Secondary | ICD-10-CM

## 2017-02-26 DIAGNOSIS — F17210 Nicotine dependence, cigarettes, uncomplicated: Secondary | ICD-10-CM | POA: Diagnosis present

## 2017-02-26 DIAGNOSIS — F1910 Other psychoactive substance abuse, uncomplicated: Secondary | ICD-10-CM

## 2017-02-26 DIAGNOSIS — Z046 Encounter for general psychiatric examination, requested by authority: Secondary | ICD-10-CM | POA: Diagnosis present

## 2017-02-26 DIAGNOSIS — T17928A Food in respiratory tract, part unspecified causing other injury, initial encounter: Secondary | ICD-10-CM

## 2017-02-26 DIAGNOSIS — G43909 Migraine, unspecified, not intractable, without status migrainosus: Secondary | ICD-10-CM | POA: Diagnosis present

## 2017-02-26 DIAGNOSIS — R45851 Suicidal ideations: Secondary | ICD-10-CM | POA: Diagnosis present

## 2017-02-26 DIAGNOSIS — T17920A Food in respiratory tract, part unspecified causing asphyxiation, initial encounter: Secondary | ICD-10-CM

## 2017-02-26 DIAGNOSIS — F329 Major depressive disorder, single episode, unspecified: Secondary | ICD-10-CM | POA: Diagnosis present

## 2017-02-26 DIAGNOSIS — F339 Major depressive disorder, recurrent, unspecified: Secondary | ICD-10-CM | POA: Insufficient documentation

## 2017-02-26 DIAGNOSIS — Z888 Allergy status to other drugs, medicaments and biological substances status: Secondary | ICD-10-CM | POA: Diagnosis not present

## 2017-02-26 DIAGNOSIS — F13239 Sedative, hypnotic or anxiolytic dependence with withdrawal, unspecified: Secondary | ICD-10-CM | POA: Diagnosis present

## 2017-02-26 DIAGNOSIS — F32A Depression, unspecified: Secondary | ICD-10-CM

## 2017-02-26 DIAGNOSIS — F1220 Cannabis dependence, uncomplicated: Secondary | ICD-10-CM | POA: Diagnosis present

## 2017-02-26 DIAGNOSIS — F1914 Other psychoactive substance abuse with psychoactive substance-induced mood disorder: Secondary | ICD-10-CM | POA: Diagnosis not present

## 2017-02-26 DIAGNOSIS — F13230 Sedative, hypnotic or anxiolytic dependence with withdrawal, uncomplicated: Secondary | ICD-10-CM | POA: Diagnosis not present

## 2017-02-26 LAB — COMPREHENSIVE METABOLIC PANEL
ALT: 11 U/L — AB (ref 14–54)
AST: 19 U/L (ref 15–41)
Albumin: 4.6 g/dL (ref 3.5–5.0)
Alkaline Phosphatase: 37 U/L — ABNORMAL LOW (ref 38–126)
Anion gap: 10 (ref 5–15)
BILIRUBIN TOTAL: 0.8 mg/dL (ref 0.3–1.2)
BUN: 16 mg/dL (ref 6–20)
CHLORIDE: 102 mmol/L (ref 101–111)
CO2: 26 mmol/L (ref 22–32)
CREATININE: 1.08 mg/dL — AB (ref 0.44–1.00)
Calcium: 9.6 mg/dL (ref 8.9–10.3)
Glucose, Bld: 98 mg/dL (ref 65–99)
Potassium: 4.6 mmol/L (ref 3.5–5.1)
Sodium: 138 mmol/L (ref 135–145)
TOTAL PROTEIN: 7.7 g/dL (ref 6.5–8.1)

## 2017-02-26 LAB — CBC
HCT: 45.5 % (ref 35.0–47.0)
Hemoglobin: 15.4 g/dL (ref 12.0–16.0)
MCH: 30.3 pg (ref 26.0–34.0)
MCHC: 34 g/dL (ref 32.0–36.0)
MCV: 89 fL (ref 80.0–100.0)
PLATELETS: 286 10*3/uL (ref 150–440)
RBC: 5.11 MIL/uL (ref 3.80–5.20)
RDW: 14.2 % (ref 11.5–14.5)
WBC: 11.5 10*3/uL — AB (ref 3.6–11.0)

## 2017-02-26 LAB — URINE DRUG SCREEN, QUALITATIVE (ARMC ONLY)
AMPHETAMINES, UR SCREEN: NOT DETECTED
BARBITURATES, UR SCREEN: NOT DETECTED
BENZODIAZEPINE, UR SCRN: POSITIVE — AB
Cannabinoid 50 Ng, Ur ~~LOC~~: POSITIVE — AB
Cocaine Metabolite,Ur ~~LOC~~: POSITIVE — AB
MDMA (Ecstasy)Ur Screen: NOT DETECTED
Methadone Scn, Ur: NOT DETECTED
Opiate, Ur Screen: NOT DETECTED
Phencyclidine (PCP) Ur S: NOT DETECTED
TRICYCLIC, UR SCREEN: POSITIVE — AB

## 2017-02-26 LAB — URINALYSIS, COMPLETE (UACMP) WITH MICROSCOPIC
BILIRUBIN URINE: NEGATIVE
Bacteria, UA: NONE SEEN
GLUCOSE, UA: NEGATIVE mg/dL
Hgb urine dipstick: NEGATIVE
KETONES UR: 5 mg/dL — AB
LEUKOCYTES UA: NEGATIVE
Nitrite: NEGATIVE
PH: 5 (ref 5.0–8.0)
Protein, ur: NEGATIVE mg/dL
Specific Gravity, Urine: 1.021 (ref 1.005–1.030)

## 2017-02-26 LAB — ETHANOL: Alcohol, Ethyl (B): <5 mg/dL (ref <5)

## 2017-02-26 MED ORDER — CLONAZEPAM 0.5 MG PO TABS
0.5000 mg | ORAL_TABLET | Freq: Two times a day (BID) | ORAL | Status: DC
  Administered 2017-02-26: 0.5 mg via ORAL
  Filled 2017-02-26: qty 1

## 2017-02-26 MED ORDER — MAGNESIUM HYDROXIDE 400 MG/5ML PO SUSP: 30.0000 mL | Freq: Every day | ORAL | Status: DC | PRN

## 2017-02-26 MED ORDER — ACETAMINOPHEN 325 MG PO TABS
650.0000 mg | ORAL_TABLET | Freq: Four times a day (QID) | ORAL | Status: DC | PRN
  Administered 2017-03-02: 650 mg via ORAL
  Filled 2017-02-26: qty 2

## 2017-02-26 MED ORDER — FLUOXETINE HCL 10 MG PO CAPS
10.0000 mg | ORAL_CAPSULE | Freq: Every day | ORAL | Status: DC
  Administered 2017-02-27: 10 mg via ORAL
  Filled 2017-02-26: qty 1

## 2017-02-26 MED ORDER — IBUPROFEN 600 MG PO TABS
ORAL_TABLET | ORAL | Status: AC
  Administered 2017-02-26: 600 mg via ORAL
  Filled 2017-02-26: qty 1

## 2017-02-26 MED ORDER — IBUPROFEN 600 MG PO TABS: 600.0000 mg | ORAL_TABLET | Freq: Once | ORAL | Status: DC

## 2017-02-26 MED ORDER — FLUOXETINE HCL 10 MG PO CAPS
10.0000 mg | ORAL_CAPSULE | Freq: Every day | ORAL | Status: DC
  Administered 2017-02-26: 10 mg via ORAL
  Filled 2017-02-26: qty 1

## 2017-02-26 MED ORDER — IBUPROFEN 600 MG PO TABS
600.0000 mg | ORAL_TABLET | Freq: Once | ORAL | Status: DC
  Administered 2017-02-26: 600 mg via ORAL

## 2017-02-26 MED ORDER — AMITRIPTYLINE HCL 25 MG PO TABS
25.0000 mg | ORAL_TABLET | Freq: Every day | ORAL | Status: DC
  Administered 2017-02-26: 25 mg via ORAL
  Filled 2017-02-26: qty 1

## 2017-02-26 MED ORDER — ALUM & MAG HYDROXIDE-SIMETH 200-200-20 MG/5ML PO SUSP: 30.0000 mL | ORAL | Status: DC | PRN

## 2017-02-26 MED ORDER — CLONAZEPAM 0.5 MG PO TABS: 0.5000 mg | ORAL_TABLET | Freq: Two times a day (BID) | ORAL | Status: DC

## 2017-02-26 MED ORDER — AMITRIPTYLINE HCL 25 MG PO TABS: 25.0000 mg | ORAL_TABLET | Freq: Every day | ORAL | Status: DC
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
## 2017-02-26 NOTE — ED Notes (Signed)
Hourly rounding reveals patient in room. No complaints, stable, in no acute distress. Q15 minute rounds and monitoring via Security Cameras to continue. 

## 2017-02-26 NOTE — ED Provider Notes (Signed)
[facility] Emergency Department Provider Note   ____________________________________________    I have reviewed the triage vital signs and the nursing notes.   HISTORY  Chief Complaint Psychiatric Evaluation     HPI Lori Miller is a 36 y.o. female who presents with complaints of depression. She reports her last week she has been on a crack binge with her ex-boyfriend, she reports this is the first time she has used crack cocaine. She is also been off of her depression medications. She reports some sense of wanting to harm herself does not have a plan and denies suicidality   Past Medical History:  Diagnosis Date  . Anxiety   . Depression   . Migraine     Patient Active Problem List   Diagnosis Date Noted  . Sinusitis, acute, maxillary 04/30/2016  . Cervical spine pain 02/21/2016  . Thoracic back pain 12/25/2015  . Low TSH level 11/25/2015  . Extremity edema 11/21/2015  . Cutaneous wart 11/21/2015  . Vaginal discharge 10/18/2015  . Viral illness 09/12/2015  . Tension headache 06/07/2015  . Leukocytosis 06/07/2015  . Irregular periods 05/23/2015  . Anxiety and depression 05/01/2015  . Migraine headache 05/01/2015  . Abdominal pain 05/01/2015  . Loss of weight 05/01/2015    Past Surgical History:  Procedure Laterality Date  . BUNIONECTOMY    . CYST EXCISION      Prior to Admission medications   Medication Sig Start Date End Date Taking? Authorizing Provider  ALPRAZolam Prudy Feeler) 0.5 MG tablet TAKE 1 TABLET 3 TIMES A DAY AS NEEDED 01/09/17   Glori Luis, MD     Allergies Flagyl [metronidazole]  History reviewed. No pertinent family history.  Social History Social History  Substance Use Topics  . Smoking status: Current Every Day Smoker    Packs/day: 1.00    Types: Cigarettes  . Smokeless tobacco: Never Used  . Alcohol use No    Review of Systems  Constitutional: No fever/chills Eyes: No visual changes.    ENT: No sore throat. Cardiovascular: Denies chest pain. Respiratory: Denies shortness of breath. Gastrointestinal: No abdominal pain.  No nausea, no vomiting.   Genitourinary: Negative for dysuria. Musculoskeletal: Central back pain, chronic Skin: Negative for rash. Neurological: Negative for headaches    ____________________________________________   PHYSICAL EXAM:  VITAL SIGNS: ED Triage Vitals  Enc Vitals Group     BP 02/26/17 1246 119/68     Pulse Rate 02/26/17 1246 (!) 106     Resp 02/26/17 1246 18     Temp 02/26/17 1246 98.5 F (36.9 C)     Temp Source 02/26/17 1246 Oral     SpO2 02/26/17 1246 97 %     Weight 02/26/17 1247 56.7 kg (125 lb)     Height 02/26/17 1247 1.626 m (5\' 4" )     Head Circumference --      Peak Flow --      Pain Score 02/26/17 1246 8     Pain Loc --      Pain Edu? --      Excl. in GC? --     Constitutional: Alert and oriented. No acute distress. Tearful  Eyes: Conjunctivae are normal.  Head: Atraumatic. Nose: No congestion/rhinnorhea. Mouth/Throat: Mucous membranes are moist.    Cardiovascular: Normal rate, regular rhythm. Grossly normal heart sounds.  Good peripheral circulation. Respiratory: Normal respiratory effort.  No retractions. Lungs CTAB. Gastrointestinal: Soft and nontender. No distention.  No CVA tenderness. Genitourinary: deferred Musculoskeletal: .  Warm and well perfused Neurologic:  Normal speech and language. No gross focal neurologic deficits are appreciated.  Skin:  Skin is warm, dry and intact. No rash noted. Psychiatric: Mood is depressed, tearful  ____________________________________________   LABS (all labs ordered are listed, but only abnormal results are displayed)  Labs Reviewed  COMPREHENSIVE METABOLIC PANEL - Abnormal; Notable for the following:       Result Value   Creatinine, Ser 1.08 (*)    ALT 11 (*)    Alkaline Phosphatase 37 (*)    All other components within normal limits  CBC - Abnormal;  Notable for the following:    WBC 11.5 (*)    All other components within normal limits  URINE DRUG SCREEN, QUALITATIVE ([facility] ONLY) - Abnormal; Notable for the following:    Tricyclic, Ur Screen POSITIVE (*)    Cocaine Metabolite,Ur [place] POSITIVE (*)    Cannabinoid 50 Ng, Ur [place] POSITIVE (*)    Benzodiazepine, Ur Scrn POSITIVE (*)    All other components within normal limits  ETHANOL  URINALYSIS, COMPLETE (UACMP) WITH MICROSCOPIC   ____________________________________________  EKG  None ____________________________________________  RADIOLOGY  None ____________________________________________   PROCEDURES  Procedure(s) performed: No    Critical Care performed: No ____________________________________________   INITIAL IMPRESSION / ASSESSMENT AND PLAN / ED COURSE  Pertinent labs & imaging results that were available during my care of the patient were reviewed by me and considered in my medical decision making (see chart for details).  Patient presents with complaints of depression and possible withdrawal. She is depressed but denies plan for suicide. Wants to speak with psychiatrist and is willing to wait to do so. No indication for IVC at this time. No chest pain or shortness of breath. Medically cleared for psych eval    ____________________________________________   FINAL CLINICAL IMPRESSION(S) / ED DIAGNOSES  Final diagnoses:  Depression, unspecified depression type  Polysubstance abuse      NEW MEDICATIONS STARTED DURING THIS VISIT:  New Prescriptions   No medications on file     Note:  This document was prepared using Dragon voice recognition software and may include unintentional dictation errors.    Jene EveryKinner, Etheline Geppert, MD 02/26/17 1530

## 2017-02-26 NOTE — ED Notes (Signed)
Report to include Situation, Background, Assessment, and Recommendations received from Amy RN. Patient alert and oriented, warm and dry, in no acute distress. Patient denies SI, HI, AVH and pain. Patient made aware of Q15 minute rounds and security cameras for their safety. Patient instructed to come to me with needs or concerns.  

## 2017-02-26 NOTE — ED Triage Notes (Signed)
Pt from home; has been using crack cocaine for last week with her now ex-boyfriend. She feels likes she is going through withdrawals. Pt states she was suicidal earlier today but isn't sure if she is still feeling SI. Pt denies plan or access to weapons. Pt alert & tearful during triage.

## 2017-02-26 NOTE — ED Notes (Signed)
[facility] by Dr. Toni Amendlapacs.  Attending Physician will be Dr. Ardyth HarpsHernandez.   Patient [facility] room 303, [facility] Nurse HurleyPhyllis.   Intake Paper Work has been signed and placed on patient chart.  ER staff is aware of the admission Misty Stanley( [facility].; ER MD; Amy  Patient's Nurse & Josh Patient Access).

## 2017-02-26 NOTE — ED Provider Notes (Addendum)
Dr. Toni Amendlapacs recommends inpatient admission.   Merrily Brittleifenbark, Miriam Liles, MD 02/26/17 1653   ED ECG REPORT I, Merrily BrittleNeil Rose Hippler, the attending physician, personally viewed and interpreted this ECG.  Date: 02/26/2017  Rate: 73 Rhythm: normal sinus rhythm QRS Axis: normal Intervals: normal ST/T Wave abnormalities: normal Narrative Interpretation: unremarkable    Merrily Brittleifenbark, Keara Pagliarulo, MD 02/26/17 1742

## 2017-02-26 NOTE — Consult Note (Signed)
[facility] Consult   Reason for Consult:  Consult for 36 year old woman with a history of chronic anxiety who brought herself to the emergency room because of suicidal thoughts Referring Physician:  Rifenbach Patient Identification: Lori Miller MRN:  [id number] Principal Diagnosis: Adjustment disorder with mixed disturbance of emotions and conduct Diagnosis:   Patient Active Problem List   Diagnosis Date Noted  . Adjustment disorder with mixed disturbance of emotions and conduct [F43.25] 02/26/2017  . Cocaine abuse [F14.10] 02/26/2017  . Generalized anxiety disorder [F41.1] 02/26/2017  . Marijuana abuse [F12.10] 02/26/2017  . Sinusitis, acute, maxillary [J01.00] 04/30/2016  . Cervical spine pain [M54.2] 02/21/2016  . Thoracic back pain [M54.6] 12/25/2015  . Low TSH level [R94.6] 11/25/2015  . Extremity edema [R60.0] 11/21/2015  . Cutaneous wart [B07.9] 11/21/2015  . Vaginal discharge [N89.8] 10/18/2015  . Viral illness [B34.9] 09/12/2015  . Tension headache [G44.209] 06/07/2015  . Leukocytosis [D72.829] 06/07/2015  . Irregular periods [N92.6] 05/23/2015  . Anxiety and depression [F41.9, F32.9] 05/01/2015  . Migraine headache [G43.909] 05/01/2015  . Abdominal pain [R10.9] 05/01/2015  . Loss of weight [R63.4] 05/01/2015    Total Time spent with patient: 1 hour  Subjective:   Lori Miller is a 36 y.o. female patient admitted with "I've been off my medicine and I'm feeling terrible".  HPI:  Patient interviewed chart reviewed. 36 year old woman came voluntarily to the emergency room. States that she's been feeling bad for probably a week or more but especially terrible the last couple days. Has hardly slept at all. Has not had anything to eat probably in a couple of days. She is feeling anxious and shaky all over. She says that she is started in smoking crack cocaine because of her ex-boyfriend. She claims this is a relatively new thing and that she is only  been doing it for about a week. She also is off of her prescription medicine. She previously had been taking Xanax on a regular basis but it looks like she has been off it for at least a couple weeks. Denies any hallucinations or homicidal ideation. Has thoughts about overdosing on sleeping medicine. Says that she drinks a little alcohol but not much only a couple of beers yesterday. Not currently seeing anybody for any mental health treatment.  Medical history: Multiple medical problems including chronic pain and headaches most prominently.  Social history: Not currently working. In transit moving from where she had been staying with her ex-boyfriend to living with someone else. Sounds like she has limited social resources currently.  Substance abuse history: Long-standing problem with marijuana abuse. Intermittent abuse of other drugs. Had been prescribed Xanax regularly but claims she was not abusing it.  Past Psychiatric History: One prior psychiatric hospitalization years ago when she tried to kill her self by overdosing. She remembers having been prescribed Prozac and Zoloft in the past but is most attached to the amitriptyline and Xanax that she had more recently been taking. No history of psychosis.  Risk to Self: Is patient at risk for suicide?: Yes Risk to Others:   Prior Inpatient Therapy:   Prior Outpatient Therapy:    Past Medical History:  Past Medical History:  Diagnosis Date  . Anxiety   . Depression   . Migraine     Past Surgical History:  Procedure Laterality Date  . BUNIONECTOMY    . CYST EXCISION     Family History: History reviewed. No pertinent family history. Family Psychiatric  History: Thinks her father  had depression and may have had a suicide attempt Social History:  History  Alcohol Use No     History  Drug Use  . Frequency: 6.0 times per week  . Types: Cocaine, Marijuana    Comment: marijuana daily; crack began last week;    Social History    Social History  . Marital status: Single    Spouse name: N/A  . Number of children: N/A  . Years of education: N/A   Social History Main Topics  . Smoking status: Current Every Day Smoker    Packs/day: 1.00    Types: Cigarettes  . Smokeless tobacco: Never Used  . Alcohol use No  . Drug use: Yes    Frequency: 6.0 times per week    Types: Cocaine, Marijuana     Comment: marijuana daily; crack began last week;  . Sexual activity: Yes   Other Topics Concern  . None   Social History Narrative  . None   Additional Social History:    Allergies:   Allergies  Allergen Reactions  . Flagyl [Metronidazole] Other (See Comments)    Reaction:  Abdominal pain    Labs:  Results for orders placed or performed during the hospital encounter of 02/26/17 (from the past 48 hour(s))  Comprehensive metabolic panel     Status: Abnormal   Collection Time: 02/26/17 12:53 PM  Result Value Ref Range   Sodium 138 135 - 145 mmol/L   Potassium 4.6 3.5 - 5.1 mmol/L   Chloride 102 101 - 111 mmol/L   CO2 26 22 - 32 mmol/L   Glucose, Bld 98 65 - 99 mg/dL   BUN 16 6 - 20 mg/dL   Creatinine, Ser 1.08 (H) 0.44 - 1.00 mg/dL   Calcium 9.6 8.9 - 10.3 mg/dL   Total Protein 7.7 6.5 - 8.1 g/dL   Albumin 4.6 3.5 - 5.0 g/dL   AST 19 15 - 41 U/L   ALT 11 (L) 14 - 54 U/L   Alkaline Phosphatase 37 (L) 38 - 126 U/L   Total Bilirubin 0.8 0.3 - 1.2 mg/dL   GFR calc non Af Amer >60 >60 mL/min   GFR calc Af Amer >60 >60 mL/min    Comment: (NOTE) The eGFR has been calculated using the CKD EPI equation. This calculation has not been validated in all clinical situations. eGFR's persistently <60 mL/min signify possible Chronic Kidney Disease.    Anion gap 10 5 - 15  Ethanol     Status: None   Collection Time: 02/26/17 12:53 PM  Result Value Ref Range   Alcohol, Ethyl (B) <5 <5 mg/dL    Comment:        LOWEST DETECTABLE LIMIT FOR SERUM ALCOHOL IS 5 mg/dL FOR MEDICAL PURPOSES ONLY   cbc     Status:  Abnormal   Collection Time: 02/26/17 12:53 PM  Result Value Ref Range   WBC 11.5 (H) 3.6 - 11.0 K/uL   RBC 5.11 3.80 - 5.20 MIL/uL   Hemoglobin 15.4 12.0 - 16.0 g/dL   HCT 45.5 35.0 - 47.0 %   MCV 89.0 80.0 - 100.0 fL   MCH 30.3 26.0 - 34.0 pg   MCHC 34.0 32.0 - 36.0 g/dL   RDW 14.2 11.5 - 14.5 %   Platelets 286 150 - 440 K/uL  Urine Drug Screen, Qualitative     Status: Abnormal   Collection Time: 02/26/17 12:53 PM  Result Value Ref Range   Tricyclic, Ur Screen POSITIVE (A) NONE DETECTED  Amphetamines, Ur Screen NONE DETECTED NONE DETECTED   MDMA (Ecstasy)Ur Screen NONE DETECTED NONE DETECTED   Cocaine Metabolite,Ur [place] POSITIVE (A) NONE DETECTED   Opiate, Ur Screen NONE DETECTED NONE DETECTED   Phencyclidine (PCP) Ur S NONE DETECTED NONE DETECTED   Cannabinoid 50 Ng, Ur [place] POSITIVE (A) NONE DETECTED   Barbiturates, Ur Screen NONE DETECTED NONE DETECTED   Benzodiazepine, Ur Scrn POSITIVE (A) NONE DETECTED   Methadone Scn, Ur NONE DETECTED NONE DETECTED    Comment: (NOTE) 275  Tricyclics, urine               Cutoff 1000 ng/mL 200  Amphetamines, urine             Cutoff 1000 ng/mL 300  MDMA (Ecstasy), urine           Cutoff 500 ng/mL 400  Cocaine Metabolite, urine       Cutoff 300 ng/mL 500  Opiate, urine                   Cutoff 300 ng/mL 600  Phencyclidine (PCP), urine      Cutoff 25 ng/mL 700  Cannabinoid, urine              Cutoff 50 ng/mL 800  Barbiturates, urine             Cutoff 200 ng/mL 900  Benzodiazepine, urine           Cutoff 200 ng/mL 1000 Methadone, urine                Cutoff 300 ng/mL 1100 1200 The urine drug screen provides only a preliminary, unconfirmed 1300 analytical test result and should not be used for non-medical 1400 purposes. Clinical consideration and professional judgment should 1500 be applied to any positive drug screen result due to possible 1600 interfering substances. A more specific alternate chemical method 1700 must be used in order to  obtain a confirmed analytical result.  1800 Gas chromato graphy / mass spectrometry (GC/MS) is the preferred 1900 confirmatory method.     Current Facility-Administered Medications  Medication Dose Route Frequency Provider Last Rate Last Dose  . amitriptyline (ELAVIL) tablet 25 mg  25 mg Oral QHS Dearra Myhand T, MD      . clonazePAM (KLONOPIN) tablet 0.5 mg  0.5 mg Oral BID Lucylle Foulkes T, MD      . FLUoxetine (PROZAC) capsule 10 mg  10 mg Oral Daily Elieser Tetrick, Madie Reno, MD       Current Outpatient Prescriptions  Medication Sig Dispense Refill  . ALPRAZolam (XANAX) 0.5 MG tablet TAKE 1 TABLET 3 TIMES A DAY AS NEEDED 30 tablet 0    Musculoskeletal: Strength & Muscle Tone: within normal limits Gait & Station: normal Patient leans: N/A  Psychiatric Specialty Exam: Physical Exam  Nursing note and vitals reviewed. Constitutional: She appears well-developed and well-nourished.  HENT:  Head: Normocephalic and atraumatic.  Eyes: Pupils are equal, round, and reactive to light. Conjunctivae are normal.  Neck: Normal range of motion.  Cardiovascular: Regular rhythm and normal heart sounds.   Respiratory: Effort normal. No respiratory distress.  GI: Soft.  Musculoskeletal: Normal range of motion.  Neurological: She is alert.  Skin: Skin is warm and dry.  Psychiatric: Her mood appears anxious. Her speech is delayed. She is slowed and withdrawn. Cognition and memory are impaired. She expresses impulsivity. She exhibits a depressed mood. She expresses suicidal ideation. She expresses suicidal plans.    Review of Systems  Constitutional: Positive for malaise/fatigue.  HENT: Negative.   Eyes: Negative.   Respiratory: Negative.   Cardiovascular: Negative.   Gastrointestinal: Negative.   Musculoskeletal: Negative.   Skin: Negative.   Neurological: Positive for weakness.  Psychiatric/Behavioral: Positive for depression, substance abuse and suicidal ideas. Negative for hallucinations. The  patient is nervous/anxious and has insomnia.     Blood pressure 119/68, pulse (!) 106, temperature 98.5 F (36.9 C), temperature source Oral, resp. rate 18, height _0  (1.626 m), weight 125 lb (56.7 kg), last menstrual period 01/25/2017, SpO2 97 %.Body mass index is 21.46 kg/m.  General Appearance: Disheveled  Eye Contact:  Minimal  Speech:  Slow  Volume:  Decreased  Mood:  Anxious, Depressed and Dysphoric  Affect:  Constricted, Depressed and Tearful  Thought Process:  Goal Directed  Orientation:  Full (Time, Place, and Person)  Thought Content:  Logical and Rumination  Suicidal Thoughts:  Yes.  with intent/plan  Homicidal Thoughts:  No  Memory:  Immediate;   Good Recent;   Fair Remote;   Fair  Judgement:  Fair  Insight:  Fair  Psychomotor Activity:  Decreased  Concentration:  Concentration: Fair  Recall:  [employer] of Knowledge:  Fair  Language:  Fair  Akathisia:  No  Handed:  Right  AIMS (if indicated):     Assets:  Desire for Improvement Resilience  ADL's:  Impaired  Cognition:  Impaired,  Mild  Sleep:        Treatment Plan Summary: Daily contact with patient to assess and evaluate symptoms and progress in treatment, Medication management and Plan 36 year old woman with symptoms of depression and anxiety with suicidal thoughts. Admit to psychiatric ward. We will get a EKG checked. Full set of labs on admission. I'm going to restart her on a modest dose of clonazepam because of how jittery she is at her previously documented prescriptions for Xanax. Low dose of Elavil at night. Also restart fluoxetine 10 mg a day. Patient understands and is agreeable to the plan.  Disposition: Recommend psychiatric Inpatient admission when medically cleared. Supportive therapy provided about ongoing stressors.  Alethia Berthold, MD 02/26/2017 5:04 PM

## 2017-02-27 ENCOUNTER — Other Ambulatory Visit: Payer: Self-pay

## 2017-02-27 DIAGNOSIS — F13939 Sedative, hypnotic or anxiolytic use, unspecified with withdrawal, unspecified: Secondary | ICD-10-CM

## 2017-02-27 DIAGNOSIS — F17200 Nicotine dependence, unspecified, uncomplicated: Secondary | ICD-10-CM

## 2017-02-27 DIAGNOSIS — F331 Major depressive disorder, recurrent, moderate: Secondary | ICD-10-CM

## 2017-02-27 DIAGNOSIS — F1420 Cocaine dependence, uncomplicated: Secondary | ICD-10-CM

## 2017-02-27 DIAGNOSIS — F1220 Cannabis dependence, uncomplicated: Secondary | ICD-10-CM

## 2017-02-27 DIAGNOSIS — F13239 Sedative, hypnotic or anxiolytic dependence with withdrawal, unspecified: Secondary | ICD-10-CM

## 2017-02-27 LAB — URINALYSIS, COMPLETE (UACMP) WITH MICROSCOPIC
Bilirubin Urine: NEGATIVE
GLUCOSE, UA: NEGATIVE mg/dL
Hgb urine dipstick: NEGATIVE
KETONES UR: 5 mg/dL — AB
Leukocytes, UA: NEGATIVE
NITRITE: NEGATIVE
PH: 5 (ref 5.0–8.0)
Protein, ur: NEGATIVE mg/dL
Specific Gravity, Urine: 1.021 (ref 1.005–1.030)

## 2017-02-27 LAB — LIPID PANEL
CHOLESTEROL: 176 mg/dL (ref 0–200)
HDL: 55 mg/dL (ref >40)
LDL Cholesterol: 100 mg/dL — ABNORMAL HIGH (ref 0–99)
Total CHOL/HDL Ratio: 3.2 RATIO
Triglycerides: 104 mg/dL (ref <150)
VLDL: 21 mg/dL (ref 0–40)

## 2017-02-27 LAB — TSH: TSH: 1.865 u[IU]/mL (ref 0.350–4.500)

## 2017-02-27 MED ORDER — ONDANSETRON HCL 4 MG PO TABS
8.0000 mg | ORAL_TABLET | Freq: Three times a day (TID) | ORAL | Status: DC | PRN
  Administered 2017-02-27 – 2017-02-28 (×2): 8 mg via ORAL
  Filled 2017-02-27 (×4): qty 2

## 2017-02-27 MED ORDER — ONDANSETRON HCL 8 MG PO TABS
8.0000 mg | ORAL_TABLET | Freq: Three times a day (TID) | ORAL | Status: DC | PRN
  Filled 2017-02-27: qty 1

## 2017-02-27 MED ORDER — CLONAZEPAM 0.5 MG PO TABS
0.5000 mg | ORAL_TABLET | Freq: Two times a day (BID) | ORAL | Status: DC
  Administered 2017-02-27: 0.5 mg via ORAL
  Filled 2017-02-27: qty 1

## 2017-02-27 MED ORDER — CHLORDIAZEPOXIDE HCL 25 MG PO CAPS
25.0000 mg | ORAL_CAPSULE | Freq: Four times a day (QID) | ORAL | Status: DC
  Administered 2017-02-27 – 2017-03-02 (×13): 25 mg via ORAL
  Filled 2017-02-27 (×13): qty 1

## 2017-02-27 MED ORDER — PANTOPRAZOLE SODIUM 40 MG PO TBEC
40.0000 mg | DELAYED_RELEASE_TABLET | Freq: Every day | ORAL | Status: DC
  Administered 2017-02-28 – 2017-03-04 (×5): 40 mg via ORAL
  Filled 2017-02-27 (×5): qty 1

## 2017-02-27 MED ORDER — PROMETHAZINE HCL 25 MG/ML IJ SOLN
25.0000 mg | Freq: Once | INTRAMUSCULAR | Status: AC
  Administered 2017-02-27: 25 mg via INTRAMUSCULAR
  Filled 2017-02-27: qty 1

## 2017-02-27 MED ORDER — ADULT MULTIVITAMIN W/MINERALS CH
1.0000 | ORAL_TABLET | Freq: Every day | ORAL | Status: DC
  Administered 2017-03-01 – 2017-03-02 (×2): 1 via ORAL
  Filled 2017-02-27 (×5): qty 1

## 2017-02-27 MED ORDER — NICOTINE 21 MG/24HR TD PT24
21.0000 mg | MEDICATED_PATCH | Freq: Every day | TRANSDERMAL | Status: DC
  Administered 2017-02-27 – 2017-03-03 (×4): 21 mg via TRANSDERMAL
  Filled 2017-02-27 (×7): qty 1

## 2017-02-27 MED ORDER — PROMETHAZINE HCL 25 MG/ML IJ SOLN
25.0000 mg | Freq: Four times a day (QID) | INTRAMUSCULAR | Status: DC | PRN
  Administered 2017-02-27 – 2017-03-02 (×8): 25 mg via INTRAMUSCULAR
  Filled 2017-02-27 (×11): qty 1

## 2017-02-27 MED ORDER — ENSURE ENLIVE PO LIQD
237.0000 mL | Freq: Two times a day (BID) | ORAL | Status: DC
  Administered 2017-02-27 – 2017-03-03 (×7): 237 mL via ORAL

## 2017-02-27 MED ORDER — LORAZEPAM 2 MG/ML IJ SOLN
2.0000 mg | Freq: Once | INTRAMUSCULAR | Status: AC
Start: 2017-02-27 — End: 2017-02-27
  Administered 2017-02-27: 2 mg via INTRAMUSCULAR
  Filled 2017-02-27: qty 1

## 2017-02-27 MED ORDER — BUPROPION HCL ER (XL) 150 MG PO TB24
150.0000 mg | ORAL_TABLET | Freq: Every day | ORAL | Status: DC
  Administered 2017-02-28 – 2017-03-04 (×5): 150 mg via ORAL
  Filled 2017-02-27 (×5): qty 1

## 2017-02-27 NOTE — Progress Notes (Signed)
Initial Nutrition Assessment  DOCUMENTATION CODES:   Not applicable  INTERVENTION:   Ensure Enlive po BID, each supplement provides 350 kcal and 20 grams of protein  MVI  NUTRITION DIAGNOSIS:   Inadequate oral intake related to  (depression and drug abuse) as evidenced by per patient/family report.  GOAL:   Patient will meet greater than or equal to 90% of their needs  MONITOR:   PO intake, Supplement acceptance, Weight trends  REASON FOR ASSESSMENT:   Malnutrition Screening Tool    ASSESSMENT:   36 yo SWF here on a voluntary [facility] + s/i to overdose and crack cocaine use.     Pt with long history of drug abuse. Pt with poor appetite pta in setting of depression and recent crack cocaine use. On admit, pt reports that she has not eaten for 2 days and has had poor sleep. RD suspects this pt is likely malnourished. RD will order supplements and MVI. Per chart, pt has lost 6lbs(5%) in 6 months; this is not significant.    Medications reviewed and include: prozac, elavil, zofran  Labs reviewed: wbc- 11.5(H)- 7/19  Diet Order:  Diet regular Room service appropriate? Yes; Fluid consistency: Thin  Skin:  Wound (see comment) (road rash on hands and buttocks)  Last BM:  none since admit  Height:   Ht Readings from Last 1 Encounters:  02/26/17 5\' 4"  (1.626 m)    Weight:   Wt Readings from Last 1 Encounters:  02/26/17 120 lb (54.4 kg)    Ideal Body Weight:  54.5 kg  BMI:  Body mass index is 20.6 kg/m.  Estimated Nutritional Needs:   Kcal:  1600-1800kcal/day   Protein:  60-70g/day   Fluid:  >1.6L/day   EDUCATION NEEDS:   No education needs identified at this time  Betsey Holidayasey Alessa Mazur MS, RD, LDN Pager #[phone] After Hours Pager: [phone]

## 2017-02-27 NOTE — Tx Team (Signed)
Initial Treatment Plan 02/27/2017 12:54 AM Lewanda RifeSamantha G Villagomez [id number]    PATIENT STRESSORS: Financial difficulties Legal issue Substance abuse   PATIENT STRENGTHS: Ability for insight Average or above average intelligence General fund of knowledge   PATIENT IDENTIFIED PROBLEMS:   Depression  Anxiety  Substance abuse               DISCHARGE CRITERIA:  Ability to meet basic life and health needs Improved stabilization in mood, thinking, and/or behavior Motivation to continue treatment in a less acute level of care  PRELIMINARY DISCHARGE PLAN: Attend aftercare/continuing care group Outpatient therapy  PATIENT/FAMILY INVOLVEMENT: This treatment plan has been presented to and reviewed with the patient, Lewanda RifeSamantha G Inskeep, The patient and family have been given the opportunity to ask questions and make suggestions.  Trinda PascalAmy  Glendi Mohiuddin, RN 02/27/2017, 12:54 AM

## 2017-02-27 NOTE — [facility] Suicide Risk Assessment (Signed)
[facility]   Nursing information obtained from:    Demographic factors:    Current Mental Status:    Loss Factors:    Historical Factors:    Risk Reduction Factors:     Total Time spent with patient: 1 hour Principal Problem: MDD (major depressive disorder), recurrent episode, moderate (HCC) Diagnosis:   Patient Active Problem List   Diagnosis Date Noted  . Tobacco use disorder [F17.200] 02/27/2017  . Benzodiazepine withdrawal (HCC) [F13.239] 02/27/2017  . Cannabis use disorder, severe, dependence (HCC) [F12.20] 02/27/2017  . Cocaine use disorder, mild, abuse [F14.10] 02/27/2017  . MDD (major depressive disorder), recurrent episode, moderate (HCC) [F33.1] 02/27/2017   Subjective Data:   Continued Clinical Symptoms:  Alcohol Use Disorder Identification Test Final Score (AUDIT): 1 The "Alcohol Use Disorders Identification Test", Guidelines for Use in Primary Care, Second Edition.  World Science writerHealth Organization [facility](WHO). Score between 0-7:  no or low risk or alcohol related problems. Score between 8-15:  moderate risk of alcohol related problems. Score between 16-19:  high risk of alcohol related problems. Score 20 or above:  warrants further diagnostic evaluation for alcohol dependence and treatment.   CLINICAL FACTORS:   Severe Anxiety and/or Agitation Depression:   Impulsivity Alcohol/Substance Abuse/Dependencies Previous Psychiatric Diagnoses and Treatments   Musculoskeletal:  Psychiatric Specialty Exam: Physical Exam  ROS  Blood pressure (!) 122/91, pulse 77, temperature 98.2 F (36.8 C), temperature source Oral, resp. rate 18, height 5\' 4"  (1.626 m), weight 54.4 kg (120 lb), last menstrual period 02/27/2017, SpO2 100 %.Body mass index is 20.6 kg/m.                                                    Sleep:  Number of Hours: 4.25      COGNITIVE FEATURES THAT CONTRIBUTE TO RISK:  Polarized thinking    SUICIDE RISK:   Moderate:  Frequent suicidal ideation with limited intensity, and duration, some specificity in terms of plans, no associated intent, good self-control, limited dysphoria/symptomatology, some risk factors present, and identifiable protective factors, including available and accessible social support.  PLAN OF CARE: admit  I certify that inpatient services furnished can reasonably be expected to improve the patient's condition.   Jimmy FootmanHernandez-Gonzalez,  Kana Reimann, MD 02/27/2017, 1:40 PM
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
## 2017-02-27 NOTE — [facility] Group Notes (Signed)
[facility] LCSW Group Therapy Note  Date/Time: 02/27/17, 0930  Type of Therapy and Topic:  Group Therapy:  Feelings around Relapse and Recovery  Participation Level:  Did Not Attend   Mood:  Description of Group:    Patients in this group will discuss emotions they experience before and after a relapse. They will process how experiencing these feelings, or avoidance of experiencing them, relates to having a relapse. Facilitator will guide patients to explore emotions they have related to recovery. Patients will be encouraged to process which emotions are more powerful. They will be guided to discuss the emotional reaction significant others in their lives may have to patients' relapse or recovery. Patients will be assisted in exploring ways to respond to the emotions of others without this contributing to a relapse.  Therapeutic Goals: 1. Patient will identify two or more emotions that lead to relapse for them:  2. Patient will identify two emotions that result when they relapse:  3. Patient will identify two emotions related to recovery:  4. Patient will demonstrate ability to communicate their needs through discussion and/or role plays.   Summary of Patient Progress:     Therapeutic Modalities:   Cognitive Behavioral Therapy Solution-Focused Therapy Assertiveness Training Relapse Prevention Therapy  Greg Britley Gashi, LCSW       
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
## 2017-02-27 NOTE — Plan of Care (Signed)
Problem: Activity: Goal: Sleeping patterns will improve Outcome: Not Progressing Reports poor sleep due to nausea

## 2017-02-27 NOTE — [facility] Suicide Risk Assessment (Signed)
[facility] INPATIENT:  Family/Significant Other Suicide Prevention Education  Suicide Prevention Education:  Patient Refusal for Family/Significant Other Suicide Prevention Education: The patient Lori Miller has refused to provide written consent for family/significant other to be provided Family/Significant Other Suicide Prevention Education during admission and/or prior to discharge.  Physician notified.  Eames Dibiasio L Tyrea Froberg MSW, LCSW  02/27/2017, 3:25 PM

## 2017-02-27 NOTE — Progress Notes (Signed)
Pt cooperative this morning.  Reports anxiety, nausea, vomiting and mild diarrhea - "Because I'm withdrawing from drugs."  Requesting zofran, order received from MD - given.  Compliant with AM prozac.  RN held 0800 clonazepam due to late dose at 0425 this morning.  Dr. Ardyth HarpsHernandez notified, ordered to hold.  Pt continued to be severely nauseous and experience multiple vomiting episodes.  PRN PO zofran, IM ativan and IM phenergan given over course of day with little relief.  EKG performed.  Urine specimen collected.

## 2017-02-27 NOTE — Tx Team (Signed)
Interdisciplinary Treatment and Diagnostic Plan Update  02/27/2017 Time of Session: [address] ABRI VACCA MRN: [id number]  Principal Diagnosis: <principal problem not specified>  Secondary Diagnoses: Active Problems:   Tobacco use disorder   Benzodiazepine withdrawal (HCC)   Cocaine use disorder, severe, dependence (HCC)   Cannabis use disorder, severe, dependence ([place])   Current Medications:  Current Facility-Administered Medications  Medication Dose Route Frequency Provider Last Rate Last Dose  . acetaminophen (TYLENOL) tablet 650 mg  650 mg Oral Q6H PRN Clapacs, John T, MD      . alum & mag hydroxide-simeth (MAALOX/MYLANTA) 200-200-20 MG/5ML suspension 30 mL  30 mL Oral Q4H PRN Clapacs, John T, MD      . feeding supplement (ENSURE ENLIVE) (ENSURE ENLIVE) liquid 237 mL  237 mL Oral BID BM Clapacs, John T, MD   237 mL at 02/27/17 1114  . FLUoxetine (PROZAC) capsule 10 mg  10 mg Oral Daily Clapacs, Madie Reno, MD   10 mg at 02/27/17 0803  . magnesium hydroxide (MILK OF MAGNESIA) suspension 30 mL  30 mL Oral Daily PRN Clapacs, John T, MD      . multivitamin with minerals tablet 1 tablet  1 tablet Oral Daily Clapacs, John T, MD      . ondansetron [facility]) tablet 8 mg  8 mg Oral Q8H PRN Clapacs, Madie Reno, MD   8 mg at 02/27/17 5361   PTA Medications: Prescriptions Prior to Admission  Medication Sig Dispense Refill Last Dose  . amitriptyline (ELAVIL) 25 MG tablet Take 25 mg by mouth at bedtime.     . ALPRAZolam (XANAX) 0.5 MG tablet TAKE 1 TABLET 3 TIMES A DAY AS NEEDED 30 tablet 0 prn at prn    Patient Stressors: Financial difficulties Legal issue Substance abuse  Patient Strengths: Ability for insight Average or above average intelligence General fund of knowledge  Treatment Modalities: Medication Management, Group therapy, Case management,  1 to 1 session with clinician, Psychoeducation, Recreational therapy.   Physician Treatment Plan for Primary Diagnosis: <principal problem  not specified> Long Term Goal(s): Improvement in symptoms so as ready for discharge Improvement in symptoms so as ready for discharge   Short Term Goals: Ability to identify changes in lifestyle to reduce recurrence of condition will improve Ability to demonstrate self-control will improve Ability to identify triggers associated with substance abuse/mental health issues will improve Ability to verbalize feelings will improve Ability to identify and develop effective coping behaviors will improve  Medication Management: Evaluate patient's response, side effects, and tolerance of medication regimen.  Therapeutic Interventions: 1 to 1 sessions, Unit Group sessions and Medication administration.  Evaluation of Outcomes: Not Met  Physician Treatment Plan for Secondary Diagnosis: Active Problems:   Tobacco use disorder   Benzodiazepine withdrawal (HCC)   Cocaine use disorder, severe, dependence (HCC)   Cannabis use disorder, severe, dependence ([place])  Long Term Goal(s): Improvement in symptoms so as ready for discharge Improvement in symptoms so as ready for discharge   Short Term Goals: Ability to identify changes in lifestyle to reduce recurrence of condition will improve Ability to demonstrate self-control will improve Ability to identify triggers associated with substance abuse/mental health issues will improve Ability to verbalize feelings will improve Ability to identify and develop effective coping behaviors will improve     Medication Management: Evaluate patient's response, side effects, and tolerance of medication regimen.  Therapeutic Interventions: 1 to 1 sessions, Unit Group sessions and Medication administration.  Evaluation of Outcomes: Not Met   RN  Treatment Plan for Primary Diagnosis: <principal problem not specified> Long Term Goal(s): Knowledge of disease and therapeutic regimen to maintain health will improve  Short Term Goals: Ability to verbalize feelings will  improve, Ability to identify and develop effective coping behaviors will improve and Compliance with prescribed medications will improve  Medication Management: RN will administer medications as ordered by provider, will assess and evaluate patient's response and provide education to patient for prescribed medication. RN will report any adverse and/or side effects to prescribing provider.  Therapeutic Interventions: 1 on 1 counseling sessions, Psychoeducation, Medication administration, Evaluate responses to treatment, Monitor vital signs and CBGs as ordered, Perform/monitor CIWA, COWS, AIMS and Fall Risk screenings as ordered, Perform wound care treatments as ordered.  Evaluation of Outcomes: Not Met   LCSW Treatment Plan for Primary Diagnosis: <principal problem not specified> Long Term Goal(s): Safe transition to appropriate next level of care at discharge, Engage patient in therapeutic group addressing interpersonal concerns.  Short Term Goals: Engage patient in aftercare planning with referrals and resources, Facilitate patient progression through stages of change regarding substance use diagnoses and concerns and Increase skills for wellness and recovery  Therapeutic Interventions: Assess for all discharge needs, 1 to 1 time with Social worker, Explore available resources and support systems, Assess for adequacy in community support network, Educate family and significant other(s) on suicide prevention, Complete Psychosocial Assessment, Interpersonal group therapy.  Evaluation of Outcomes: Not Met   Progress in Treatment: Attending groups: No. Participating in groups: No. Taking medication as prescribed: Yes. Toleration medication: Yes. Family/Significant other contact made: No, will contact:  when given permission Patient understands diagnosis: Yes. Discussing patient identified problems/goals with staff: Yes. Medical problems stabilized or resolved: Yes. Denies suicidal/homicidal  ideation: Yes. Issues/concerns per patient self-inventory: No. Other: none  New problem(s) identified: No, Describe:  none  New Short Term/Long Term Goal(s):Pt goal: "Get over my withdrawals from drugs, get back on my medications."  Discharge Plan or Barriers: CSW assessing for appropriate plan.  Reason for Continuation of Hospitalization: Depression Medication stabilization Withdrawal symptoms  Estimated Length of Stay: 5-7 days.  Attendees: Patient: Lori Miller 02/27/2017   Physician: Dr. Jerilee Hoh, MD 02/27/2017   Nursing: Elige Radon, RN 02/27/2017   RN Care Manager: 02/27/2017   Social Worker: Lurline Idol, LCSW 02/27/2017   Recreational Therapist:  02/27/2017   Other:  02/27/2017   Other:  02/27/2017  Other: 02/27/2017        Scribe for Treatment Team: Joanne Chars, [place] 02/27/2017 1:09 PM

## 2017-02-28 ENCOUNTER — Inpatient Hospital Stay: Payer: Medicaid Other

## 2017-02-28 DIAGNOSIS — R509 Fever, unspecified: Secondary | ICD-10-CM

## 2017-02-28 DIAGNOSIS — F1220 Cannabis dependence, uncomplicated: Secondary | ICD-10-CM

## 2017-02-28 DIAGNOSIS — F13230 Sedative, hypnotic or anxiolytic dependence with withdrawal, uncomplicated: Secondary | ICD-10-CM

## 2017-02-28 DIAGNOSIS — F1410 Cocaine abuse, uncomplicated: Secondary | ICD-10-CM

## 2017-02-28 LAB — COMPREHENSIVE METABOLIC PANEL
ALT: 15 U/L (ref 14–54)
AST: 28 U/L (ref 15–41)
Albumin: 4.7 g/dL (ref 3.5–5.0)
Alkaline Phosphatase: 33 U/L — ABNORMAL LOW (ref 38–126)
Anion gap: 9 (ref 5–15)
BILIRUBIN TOTAL: 0.8 mg/dL (ref 0.3–1.2)
BUN: 16 mg/dL (ref 6–20)
CALCIUM: 9.6 mg/dL (ref 8.9–10.3)
CO2: 26 mmol/L (ref 22–32)
CREATININE: 0.96 mg/dL (ref 0.44–1.00)
Chloride: 101 mmol/L (ref 101–111)
Glucose, Bld: 127 mg/dL — ABNORMAL HIGH (ref 65–99)
Potassium: 4 mmol/L (ref 3.5–5.1)
Sodium: 136 mmol/L (ref 135–145)
TOTAL PROTEIN: 7.8 g/dL (ref 6.5–8.1)

## 2017-02-28 LAB — MAGNESIUM: MAGNESIUM: 1.9 mg/dL (ref 1.7–2.4)

## 2017-02-28 LAB — CBC WITH DIFFERENTIAL/PLATELET
BASOS ABS: 0.1 10*3/uL (ref 0–0.1)
BASOS PCT: 1 %
EOS ABS: 0 10*3/uL (ref 0–0.7)
EOS PCT: 0 %
HCT: 41.2 % (ref 35.0–47.0)
Hemoglobin: 14.2 g/dL (ref 12.0–16.0)
Lymphocytes Relative: 17 %
Lymphs Abs: 2.5 10*3/uL (ref 1.0–3.6)
MCH: 30.1 pg (ref 26.0–34.0)
MCHC: 34.5 g/dL (ref 32.0–36.0)
MCV: 87.3 fL (ref 80.0–100.0)
Monocytes Absolute: 0.7 10*3/uL (ref 0.2–0.9)
Monocytes Relative: 4 %
Neutro Abs: 12 10*3/uL — ABNORMAL HIGH (ref 1.4–6.5)
Neutrophils Relative %: 78 %
Platelets: 291 10*3/uL (ref 150–440)
RBC: 4.71 MIL/uL (ref 3.80–5.20)
RDW: 14 % (ref 11.5–14.5)
WBC: 15.3 10*3/uL — AB (ref 3.6–11.0)

## 2017-02-28 LAB — PHOSPHORUS: PHOSPHORUS: 3.9 mg/dL (ref 2.5–4.6)

## 2017-02-28 MED ORDER — IBUPROFEN 600 MG PO TABS
600.0000 mg | ORAL_TABLET | Freq: Four times a day (QID) | ORAL | Status: DC | PRN
  Administered 2017-02-28: 600 mg via ORAL
  Filled 2017-02-28: qty 1
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
## 2017-02-28 NOTE — [facility] Group Notes (Signed)
[facility] LCSW Group Therapy  02/28/2017 1:53 PM  Type of Therapy:  Group Therapy  Participation Level:  Patient did not attend group. CSW invited patient to group.   Summary of Progress/Problems: Stress management: Patients defined and discussed the topic of stress and the related symptoms and triggers for stress. Patients identified healthy coping skills they would like to try during hospitalization and after discharge to manage stress in a healthy way. CSW offered insight to varying stress management techniques.   Caryle Helgeson G. Garnette CzechSampson MSW, LCSWA 02/28/2017, 1:53 PM

## 2017-02-28 NOTE — Progress Notes (Signed)
Patient slept on and off during the night due to vomiting and nausea.  She had phenergan injection twice on this shift which helped up to 3-4 hours to prevent vomiting.  Patient tolerated ginger ale sips this am.  Temp at 0600 is 100.0 F.  Patient refused Tylenol.

## 2017-02-28 NOTE — [facility] Group Notes (Signed)
[facility] Group Notes:  (Nursing/MHT/Case Management/Adjunct)  Date:  02/28/2017  Time:  6:24 AM  Type of Therapy:  Psychoeducational Skills  Participation Level:  Did Not Attend  Summary of Progress/Problems:  Lori MilroyLaquanda Y Yolunda Miller 02/28/2017, 6:24 AM

## 2017-02-28 NOTE — Plan of Care (Signed)
Problem: Medication: Goal: Compliance with prescribed medication regimen will improve Outcome: Progressing Compliant with scheduled meds   

## 2017-02-28 NOTE — Consult Note (Signed)
Medical Consultation  Lori RifeSamantha G Bardon ZOX:[id number] DOB: Jul 15, 1981 DOA: 02/26/2017 PCP: Glori LuisSonnenberg, Eric G, MD   Requesting physician: dr Francis Dowsekapour Date of consultation: 02/28/2017 Reason for consultation: fever  Impression/Recommendations 36 year old female with polysubstance abuse and suicidal thoughts admitted to psychiatry unit who is being evaluated for fever.  1. Fever: Patient is currently afebrile. Urinalysis shows no evidence of UTI Chest x-ray shows bronchitis Recommend ordering blood cultures We order chest x-ray in a.m. if patient has another fever to look for underlying pneumonia. At this time will not start antibiotics unless patient has another fever. Would recommend starting Levaquin and vancomycin given history of drug abuse (to cover for possible endocarditis) Follow CBC 2. Polysubstance abuse: Plan as per psychiatry  3. Depression with suicidal ideation: Continue Wellbutrin  4. Tobacco dependence: Patient is encouraged to quit smoking. Counseling was provided for 4 minutes.  Chief Complaint:  Fever  HPI:   36 year old female with polysubstance abuse admitted for suicidal ideation and polysubstance abuse. Hospice was consulted for fever. Patient reports no cough, shortness of breath. She had episode of nausea when she had a fever that was reported at 100. She abuses cocaine and has been heroin in the past.  Review of Systems  Constitutional: As it if her fever. No chills   HENT: Negative for ear pain, nosebleeds, congestion, facial swelling, rhinorrhea, neck pain, neck stiffness and ear discharge.   Respiratory: Negative for cough, shortness of breath, wheezing  Cardiovascular: Negative for chest pain, palpitations and leg swelling.  Gastrointestinal: Positive nausea or vomiting. No abdominal pain  Genitourinary: Negative for dysuria, urgency, frequency, hematuria Musculoskeletal: Negative for back pain or joint pain Neurological: Negative for dizziness,  seizures, syncope, focal weakness,  numbness and headaches.  Hematological: Does not bruise/bleed easily.  Psychiatric/Behavioral: Negative for hallucinations, confusion, dysphoric mood   Past Medical History:  Diagnosis Date  . Anxiety   . Depression   . Migraine    Past Surgical History:  Procedure Laterality Date  . BUNIONECTOMY    . CYST EXCISION     Social History:  reports that she has been smoking Cigarettes.  She has been smoking about 1.00 pack per day. She has never used smokeless tobacco. She reports that she uses drugs, including Cocaine, Marijuana, and Benzodiazepines, about 6 times per week. She reports that she does not drink alcohol.  Allergies  Allergen Reactions  . Flagyl [Metronidazole] Other (See Comments)    Reaction:  Abdominal pain   History reviewed. No pertinent family history.  Prior to Admission medications   Medication Sig Start Date End Date Taking? Authorizing Provider  amitriptyline (ELAVIL) 25 MG tablet Take 25 mg by mouth at bedtime.   Yes [provider]  ALPRAZolam Prudy Feeler(XANAX) 0.5 MG tablet TAKE 1 TABLET 3 TIMES A DAY AS NEEDED 01/09/17   Glori LuisSonnenberg, Eric G, MD    Physical Exam: Blood pressure 129/88, pulse 61, temperature 99.6 F (37.6 C), temperature source Oral, resp. rate 17, height 5\' 4"  (1.626 m), weight 54.4 kg (120 lb), last menstrual period 02/27/2017, SpO2 98 %. @VITALS2 @ [employer]   02/26/17 2256  Weight: 54.4 kg (120 lb)    Intake/Output Summary (Last 24 hours) at 02/28/17 1113 Last data filed at 02/27/17 1745  Gross per 24 hour  Intake                0 ml  Output                0 ml  Net  0 ml     Constitutional: Appears well-developed and well-nourished. No distress. HENT: Normocephalic. Marland Kitchen Oropharynx is clear and moist.  Eyes: Conjunctivae and EOM are normal. PERRLA, no scleral icterus.  Neck: Normal ROM. Neck supple. No JVD. No tracheal deviation. CVS: RRR, S1/S2 +, no murmurs, no gallops, no  carotid bruit.  Pulmonary: Effort and breath sounds normal, no stridor, rhonchi, wheezes, rales.  Abdominal: Soft. BS +,  no distension, tenderness, rebound or guarding.  Musculoskeletal: Normal range of motion. No edema and no tenderness.  Neuro: Alert. CN 2-12 grossly intact. No focal deficits. Skin: Skin is warm and dry. No rash noted. Psychiatric: Normal mood and affect.    Labs  Basic Metabolic Panel:  Recent Labs Lab 02/28/17 0833  NA 136  K 4.0  CL 101  CO2 26  GLUCOSE 127*  BUN 16  CREATININE 0.96  CALCIUM 9.6  MG 1.9  PHOS 3.9   Liver Function Tests:  Recent Labs Lab 02/28/17 0833  AST 28  ALT 15  ALKPHOS 33*  BILITOT 0.8  PROT 7.8  ALBUMIN 4.7   No results for input(s): LIPASE, AMYLASE in the last 168 hours.  CBC:  Recent Labs Lab 02/28/17 0833  WBC 15.3*  NEUTROABS 12.0*  HGB 14.2  HCT 41.2  MCV 87.3  PLT 291   Cardiac Enzymes: No results for input(s): CKTOTAL, CKMB, CKMBINDEX, TROPONINI in the last 168 hours. BNP: Invalid input(s): POCBNP CBG: No results for input(s): GLUCAP in the last 168 hours.  Radiological Exams: Dg Chest 2 View  Result Date: 02/28/2017 CLINICAL DATA:  Fever, possible aspiration of food, history smoking EXAM: CHEST  2 VIEW COMPARISON:  None FINDINGS: Normal heart size, mediastinal contours, and pulmonary vascularity. Minimal peribronchial thickening. No pulmonary infiltrate, pleural effusion, or pneumothorax. Bones unremarkable. IMPRESSION: Minimal bronchitic changes without infiltrate. Electronically Signed   By: Ulyses Southward M.D.   On: 02/28/2017 10:12    EKG: none   Thank you for allowing me to participate in the care of your patient. We will continue to follow.   Note: This dictation was prepared with Dragon dictation along with smaller phrase technology. Any transcriptional errors that result from this process are unintentional.  Time spent: 40 minutes  Prestina Raigoza, MD

## 2017-02-28 NOTE — Plan of Care (Signed)
Problem: Activity: Goal: Interest or engagement in activities will improve Outcome: Not Progressing Patient continues with nausea and vomiting.  She was given phenergan 25 mg IM at 2030.  Vomitus is yellow mucous as assessed at 2100.  She is also having her menses and showed writer toilet after voiding which revealed bright red blood with moderate amount of clots.  Patient says that clots are unusual for her during periods and she asked if clots could be related to her nausea and vomiting.    She took a shower this evening but did not come out of the room.  Intake included about 4 oz of Gatorade for the evening.  She requested and received Zofran at midnight.  Problem: Safety: Goal: Periods of time without injury will increase Outcome: Progressing Patient complained of feeling "wiped out" from vomiting so much today.  She agreed to try bits of ice for fluid intake.  She denies current thoughts of self harm and contracts for safety.

## 2017-02-28 NOTE — Progress Notes (Signed)
Pt remains isolative to room due to continued nausea.  Pt reports last episode of vomiting was last night.  Has been able to keep down small amounts of gingerale.  Accepted saltine crackers with Ibuprofen for 8/10 headache pain at 1132.  PRN IM phenergan requested and given at 1206. Attempted to eat dinner but could not due to nausea.  RN requested soup from dietary, given, but pt was unable to accept this either.  No additional episodes of vomiting.  Remains isolative to room.

## 2017-03-01 LAB — CBC WITH DIFFERENTIAL/PLATELET
BASOS ABS: 0.1 10*3/uL (ref 0–0.1)
Basophils Relative: 1 %
EOS PCT: 0 %
Eosinophils Absolute: 0 10*3/uL (ref 0–0.7)
HEMATOCRIT: 41.6 % (ref 35.0–47.0)
Hemoglobin: 14.5 g/dL (ref 12.0–16.0)
LYMPHS ABS: 2.6 10*3/uL (ref 1.0–3.6)
LYMPHS PCT: 26 %
MCH: 30.7 pg (ref 26.0–34.0)
MCHC: 34.8 g/dL (ref 32.0–36.0)
MCV: 88.1 fL (ref 80.0–100.0)
MONO ABS: 0.5 10*3/uL (ref 0.2–0.9)
Monocytes Relative: 5 %
NEUTROS ABS: 6.7 10*3/uL — AB (ref 1.4–6.5)
Neutrophils Relative %: 68 %
Platelets: 260 10*3/uL (ref 150–440)
RBC: 4.73 MIL/uL (ref 3.80–5.20)
RDW: 13.9 % (ref 11.5–14.5)
WBC: 10 10*3/uL (ref 3.6–11.0)

## 2017-03-01 MED ORDER — HYDROXYZINE HCL 50 MG PO TABS
50.0000 mg | ORAL_TABLET | Freq: Three times a day (TID) | ORAL | Status: DC | PRN
  Administered 2017-03-01: 50 mg via ORAL
  Filled 2017-03-01: qty 1

## 2017-03-01 NOTE — [facility] Group Notes (Signed)
[facility] Group Notes:  (Nursing/MHT/Case Management/Adjunct)  Date:  03/01/2017  Time:  5:00 AM  Type of Therapy:  Psychoeducational Skills  Participation Level:  Did Not Attend   Summary of Progress/Problems:  Lori Miller 03/01/2017, 5:00 AM

## 2017-03-01 NOTE — Plan of Care (Signed)
Problem: Safety: Goal: Periods of time without injury will increase Outcome: Progressing Patient remains safe and without injury during hospitalization and on Q 15 minute observation. Will continue to monitor patient.    

## 2017-03-01 NOTE — Progress Notes (Signed)
[facility] [place] [facility]   PATIENT NAME: Lori Miller    MR#:  [id number]  DATE OF BIRTH:  Mar 12, 1981  SUBJECTIVE:  CHIEF COMPLAINT:  No chief complaint on file. no fever.  REVIEW OF SYSTEMS:  CONSTITUTIONAL: No fever, fatigue or weakness.  EYES: No blurred or double vision.  EARS, NOSE, AND THROAT: No tinnitus or ear pain.  RESPIRATORY: No cough, shortness of breath, wheezing or hemoptysis.  CARDIOVASCULAR: No chest pain, orthopnea, edema.  GASTROINTESTINAL: No nausea, vomiting, diarrhea or abdominal pain.  GENITOURINARY: No dysuria, hematuria.  ENDOCRINE: No polyuria, nocturia,  HEMATOLOGY: No anemia, easy bruising or bleeding SKIN: No rash or lesion. MUSCULOSKELETAL: No joint pain or arthritis.   NEUROLOGIC: No tingling, numbness, weakness.  PSYCHIATRY: No anxiety or depression.   ROS  DRUG ALLERGIES:   Allergies  Allergen Reactions  . Flagyl [Metronidazole] Other (See Comments)    Reaction:  Abdominal pain    VITALS:  Blood pressure 121/84, pulse 80, temperature 99.1 F (37.3 C), temperature source Oral, resp. rate 18, height 5\' 4"  (1.626 m), weight 54.4 kg (120 lb), last menstrual period 02/27/2017, SpO2 100 %.  PHYSICAL EXAMINATION:  GENERAL:  36 y.o.-year-old patient lying in the bed with no acute distress.  EYES: Pupils equal, round, reactive to light and accommodation. No scleral icterus. Extraocular muscles intact.  HEENT: Head atraumatic, normocephalic. Oropharynx and nasopharynx clear.  NECK:  Supple, no jugular venous distention. No thyroid enlargement, no tenderness.  LUNGS: Normal breath sounds bilaterally, no wheezing, rales,rhonchi or crepitation. No use of accessory muscles of respiration.  CARDIOVASCULAR: S1, S2 normal. No murmurs, rubs, or gallops.  ABDOMEN: Soft, nontender, nondistended. Bowel sounds present. No organomegaly or mass.  EXTREMITIES: No pedal edema, cyanosis, or clubbing.  NEUROLOGIC: Cranial nerves II  through XII are intact. Muscle strength 5/5 in all extremities. Sensation intact. Gait not checked.  PSYCHIATRIC: The patient is alert and oriented x 3.  SKIN: No obvious rash, lesion, or ulcer.   Physical Exam LABORATORY PANEL:   CBC  Recent Labs Lab 03/01/17 0718  WBC 10.0  HGB 14.5  HCT 41.6  PLT 260   ------------------------------------------------------------------------------------------------------------------  Chemistries   Recent Labs Lab 02/28/17 0833  NA 136  K 4.0  CL 101  CO2 26  GLUCOSE 127*  BUN 16  CREATININE 0.96  CALCIUM 9.6  MG 1.9  AST 28  ALT 15  ALKPHOS 33*  BILITOT 0.8   ------------------------------------------------------------------------------------------------------------------  Cardiac Enzymes No results for input(s): TROPONINI in the last 168 hours. ------------------------------------------------------------------------------------------------------------------  RADIOLOGY:  Dg Chest 2 View  Result Date: 02/28/2017 CLINICAL DATA:  Fever, possible aspiration of food, history smoking EXAM: CHEST  2 VIEW COMPARISON:  None FINDINGS: Normal heart size, mediastinal contours, and pulmonary vascularity. Minimal peribronchial thickening. No pulmonary infiltrate, pleural effusion, or pneumothorax. Bones unremarkable. IMPRESSION: Minimal bronchitic changes without infiltrate. Electronically Signed   By: Ulyses SouthwardMark  Boles M.D.   On: 02/28/2017 10:12    ASSESSMENT AND PLAN:   Principal Problem:   MDD (major depressive disorder), recurrent episode, moderate (HCC) Active Problems:   Tobacco use disorder   Benzodiazepine withdrawal (HCC)   Cannabis use disorder, severe, dependence (HCC)   Cocaine use disorder, mild, abuse   Fever  * Fever    No fever in last 30 hrs.    UA , Xray chest and blood cx are negative for 24 hrs.    Pt have no symptoms.    Would suggest no Abx or any interventions  at this time.  Polysubstance abuse Depression     Plan per primary team.  I will sign off, please call for any further needs.  All the records are reviewed and case discussed with Care Management/Social Workerr. Management plans discussed with the patient, family and they are in agreement.  CODE STATUS: Full.  TOTAL TIME TAKING CARE OF THIS PATIENT: 35 minutes.   POSSIBLE D/C IN 1-2 DAYS, DEPENDING ON CLINICAL CONDITION.   Altamese Dilling M.D on 03/01/2017   Between 7am to 6pm - Pager - [phone]  After 6pm go to [email or web address] - Social research officer, government  [facility]  Office  [phone]  CC: Primary care physician; Glori Luis, MD  Note: This dictation was prepared with Dragon dictation along with smaller phrase technology. Any transcriptional errors that result from this process are unintentional.

## 2017-03-01 NOTE — Progress Notes (Signed)
Patient was withdrawn to room, but did come out for meals and unit group. Patient state she feels better and temp has come back to normal. Patient complained of nausea one time this shift. Patient denies SI/HI/AH/VH.  Patient is alert and oriented x 4, breathing unlabored, and extremities x 4 within normal limits. Patient is calm and cooperative. Patient did not display any disruptive behavior. Patient continues to be monitored on 15 minute safety checks. Will continue to monitor patient and notify MD of any changes.

## 2017-03-01 NOTE — Plan of Care (Signed)
Problem: Education: Goal: Mental status will improve Outcome: Progressing Patient participated in assessment at beginning of this shift.  She continues to complain of nausea and was given Phenergan at 2010.  She consumed ginger ale and ice this shift.  She continues to have decreased appetite and can only tolerate fluids.  Patient was out of the room for brief periods to get supplies.    Problem: Coping: Goal: Ability to verbalize frustrations and anger appropriately will improve Outcome: Progressing Patient verbalized concerns about feeling sick and also expressed appreciation of staff.  She states she regrets not being more active on the unit. She denies active feelings of self harm and contracts for safety. Content is clear and logical.
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
## 2017-03-01 NOTE — [facility] Group Notes (Signed)
[facility] Group Notes:  (Clinical Social Work)  03/01/2017  1:00-2:00PM  Summary of Progress/Problems:  The main focus of today's process group was to listen to a variety of genres of music and to identify that different types of music provoke different responses.  The patient then was able to identify personally what was soothing for them, as well as energizing, as well as how patient can personally use this knowledge in sleep habits, with depression, and with other symptoms.  The patient expressed at the beginning of group the overall feeling of "I don't know, nothing really."  She interacted throughout group, cried at times, stated frequently that different songs made her feel relaxed, and at the end of group asked for a list of the songs played.  At the end of group, she stated she felt "better, more peaceful."  Type of Therapy:  Music Therapy   Participation Level:  Active  Participation Quality:  Attentive and Sharing  Affect:  Blunted and Tearful  Cognitive:  Oriented  Insight:  Engaged  Engagement in Therapy:  Engaged  Modes of Intervention:   Activity, Exploration  Ambrose MantleMareida Grossman-Orr, LCSW 03/01/2017

## 2017-03-02 MED ORDER — CHLORDIAZEPOXIDE HCL 25 MG PO CAPS
25.0000 mg | ORAL_CAPSULE | Freq: Three times a day (TID) | ORAL | Status: DC
  Administered 2017-03-02 – 2017-03-04 (×6): 25 mg via ORAL
  Filled 2017-03-02 (×6): qty 1

## 2017-03-02 MED ORDER — AMITRIPTYLINE HCL 25 MG PO TABS
25.0000 mg | ORAL_TABLET | Freq: Every day | ORAL | Status: DC
  Administered 2017-03-02 – 2017-03-03 (×2): 25 mg via ORAL
  Filled 2017-03-02 (×2): qty 1

## 2017-03-02 MED ORDER — BUPROPION HCL ER (XL) 150 MG PO TB24: 150.0000 mg | ORAL_TABLET | Freq: Every day | ORAL | 0 refills | Status: DC

## 2017-03-02 MED ORDER — AMITRIPTYLINE HCL 25 MG PO TABS: 25.0000 mg | ORAL_TABLET | Freq: Every day | ORAL | 0 refills | Status: DC

## 2017-03-02 MED ORDER — ONDANSETRON HCL 4 MG PO TABS
4.0000 mg | ORAL_TABLET | Freq: Three times a day (TID) | ORAL | Status: DC | PRN
  Administered 2017-03-02 – 2017-03-03 (×2): 4 mg via ORAL
  Filled 2017-03-02 (×3): qty 1

## 2017-03-02 MED ORDER — HYDROXYZINE HCL 25 MG PO TABS
25.0000 mg | ORAL_TABLET | Freq: Three times a day (TID) | ORAL | Status: DC | PRN
  Administered 2017-03-02: 25 mg via ORAL
  Filled 2017-03-02: qty 1

## 2017-03-02 MED ORDER — ACETAMINOPHEN 500 MG PO TABS
1000.0000 mg | ORAL_TABLET | Freq: Four times a day (QID) | ORAL | Status: DC | PRN
  Administered 2017-03-03: 1000 mg via ORAL
  Filled 2017-03-02: qty 2
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
## 2017-03-02 NOTE — Progress Notes (Signed)
Patient continues to complain of withdrawal symptoms. Report not feeling well at all. Phenergan IM given per request. Pt reports zofran does nothing for her. Pt did attend groups and has some interaction with staff and peers. Denies SI, HI, AVH. Medication and group compliant. Encouragement and support offered. Safety checks maintained. Medications given as prescribed. Pt receptive and remains safe on unit with q 15 min checks.

## 2017-03-02 NOTE — Plan of Care (Signed)
Problem: Health Behavior/Discharge Planning: Goal: Compliance with treatment plan for underlying cause of condition will improve Outcome: Progressing Patient participating in group activities

## 2017-03-02 NOTE — Plan of Care (Signed)
Problem: Coping: Goal: Ability to verbalize frustrations and anger appropriately will improve Outcome: Progressing On initial rounds, Patient stated she was feeling very nauseated and was hungry "but afraid to eat because I don't want to start vomiting".  She requested Phenergan IM which was given at 2030.  She was later able to tolerate orange slices and juice.  She spent time talking on the phone tonight and also went into the dayroom for brief periods. She talked about feeling frustrated with being sick and hoping "to get over the nausea soon".    Problem: Self-Concept: Goal: Ability to disclose and discuss suicidal ideas will improve Outcome: Progressing Patient described feeling "physically wiped out" but is not "as overwhelmed".  She denies active feelings of self harm and contracts for safety.  She showed Clinical research associatewriter a Building services engineerphoto of her son who will soon graduate from boot camp and expressed positive feelings.

## 2017-03-02 NOTE — [facility] Group Notes (Signed)
[facility] LCSW Group Therapy   03/02/2017 9:30 am Type of Therapy: Group Therapy   Participation Level: Minimal   Participation Quality: Attentive, Sharing and Supportive   Affect: Depressed and Flat   Cognitive: Alert and Oriented   Insight: Developing/Improving and Engaged   Engagement in Therapy: Developing/Improving and Engaged   Modes of Intervention: Clarification, Confrontation, Discussion, Education, Exploration,  Limit-setting, Orientation, Problem-solving, Rapport Building, Dance movement psychotherapisteality Testing, Socialization and Support   Summary of Progress/Problems: Pt identified obstacles faced currently and processed barriers involved in overcoming these obstacles. Pt identified steps necessary for overcoming these obstacles and explored motivation (internal and external) for facing these difficulties head on. Pt further identified one area of concern in their lives and chose a goal to focus on for today. Pt stated that she did not know what obstacles led to her hospitalization. Pt remained quiet throughout group and was tearful.    Hampton AbbotKadijah Aubrielle Stroud, MSW, LCSW-A 03/02/2017, 10:12AM

## 2017-03-03 NOTE — [facility] Group Notes (Signed)
[facility] LCSW Group Therapy Note  Date/Time: 03/03/2017, 9:30 AM  Type of Therapy/Topic:  Group Therapy:  Feelings about Diagnosis  Participation Level:  Active   Mood: Appropriate    Description of Group:    This group will allow patients to explore their thoughts and feelings about diagnoses they have received. Patients will be guided to explore their level of understanding and acceptance of these diagnoses. Facilitator will encourage patients to process their thoughts and feelings about the reactions of others to their diagnosis, and will guide patients in identifying ways to discuss their diagnosis with significant others in their lives. This group will be process-oriented, with patients participating in exploration of their own experiences as well as giving and receiving support and challenge from other group members.   Therapeutic Goals: 1. Patient will demonstrate understanding of diagnosis as evidence by identifying two or more symptoms of the disorder:  2. Patient will be able to express two feelings regarding the diagnosis 3. Patient will demonstrate ability to communicate their needs through discussion and/or role plays  Summary of Patient Progress: Patient identified diagnosis and what it means to her. She stated that she has a lot of mixed emotions because she does not know what to expect at discharge. Patient expressed feelings of hopelessness as well as feeling afraid. Patient identified coping mechanisms such as hobbies that will allow patient to regulate negative emotions. CSW provided support.    Therapeutic Modalities:   Cognitive Behavioral Therapy Brief Therapy Feelings Identification   Hampton AbbotKadijah Areesha Dehaven, MSW, LCSW-A 03/03/2017, 10:21 AM Hampton AbbotKadijah Dashley Monts, MSW, LCSW-A 03/03/2017, 10:
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
## 2017-03-03 NOTE — Progress Notes (Signed)
   03/03/17 0940  Clinical Encounter Type  Visited With Patient  Visit Type Initial;Spiritual support;Behavioral Health  Referral From Nurse   [facility] to consult for suicidal thoughts. Patient stated she feels like she has nothing left to give and that she has hit the bottom. CH offered emotional support, listened to patient's concerns, and offered to visit with again. Patient will attend group and call to speak again later.

## 2017-03-03 NOTE — Plan of Care (Signed)
Problem: Activity: Goal: Sleeping patterns will improve Outcome: Progressing Patient slept for Estimated Hours of 3.45; Precautionary checks every 15 minutes for safety maintained, room free of safety hazards, patient sustains no injury or falls during this shift.    

## 2017-03-03 NOTE — Progress Notes (Signed)
CSW spoke with [employer] about mental health providers in [place] and was informed that there are none.  [place] will have to receive services through [facility]. Garner NashGregory Tinesha Siegrist, MSW, LCSW Clinical Social Worker 03/03/2017 8:17 AM

## 2017-03-03 NOTE — Progress Notes (Signed)
Patient is alert and oriented to person, place and time. Skin is warm, dry and intact. No limitations to all four extremities noted. Patient currently denies SI at this time. Patient complained of nausea and sore throat this morning. PRN medications administered to patient with semi-effective results. Patient was observed ambulating in hall during the shift with a steady gait. Attends meals with selective peer interaction noted. Milieu remains therapeutic. Patient will be monitored and physician notified of any acute changes.

## 2017-03-03 NOTE — Progress Notes (Signed)
Patient ID: Lori RifeSamantha G Miller, female   DOB: Feb 09, 1981, 36 y.o.   MRN: [id number] A&Ox3, c/o 7/10 back pain, seeking stronger analgesia other than Tylenol, requested for benzodiazepine (Xanax and Clonazepam) for severe anxiety and asked to call [facility]. "I am actually doing better than I expected; out of my room and socializing with others .Marland Kitchen..." Pleasant, polite, receptive, listened and attentive during the Wrap-Up Group; denied SI/HI/AVH.

## 2017-03-04 MED ORDER — HYDROXYZINE HCL 25 MG PO TABS: 25.0000 mg | ORAL_TABLET | Freq: Three times a day (TID) | ORAL | 0 refills | Status: DC | PRN

## 2017-03-04 NOTE — Plan of Care (Signed)
Problem: Activity: Goal: Sleeping patterns will improve Outcome: Progressing Patient slept for Estimated Hours of 6.45; Precautionary checks every 15 minutes for safety maintained, room free of safety hazards, patient sustains no injury or falls during this shift.    

## 2017-03-04 NOTE — [facility] Group Notes (Signed)
[facility] LCSW Group Therapy   03/04/2017  9:30 am   Type of Therapy: Group Therapy   Participation Level: Active   Participation Quality: Attentive, Sharing and Supportive   Affect: Depressed and Flat   Cognitive: Alert and Oriented   Insight: Developing/Improving and Engaged   Engagement in Therapy: Developing/Improving and Engaged   Modes of Intervention: Clarification, Confrontation, Discussion, Education, Exploration, Limit-setting, Orientation, Problem-solving, Rapport Building, Dance movement psychotherapisteality Testing, Socialization and Support   Summary of Progress/Problems: The topic for group today was emotional regulation. This group focused on both positive and negative emotion identification and allowed  group members to process ways to identify feelings, regulate negative emotions, and find healthy ways to manage internal/external emotions. Group members were asked to reflect on a time when their reaction to an emotion led to a negative outcome and explored how alternative responses using emotion regulation would have benefited them. Group members were also asked to discuss a time when emotion regulation was utilized when a negative emotion was experienced. Patient appeared tearful throughout group with growing insight on diagnosis. Patient defined what emotion regulation meant to her. Patient identified emotions that she felt before being admitted to the hospital and the behavior that came with it. She also identified coping mechanisms or opposite actions in order to regulate those negative emotions.    Hampton AbbotKadijah Lilac Miller, MSW, LCSW-A 03/04/2017, 10:35AM

## 2017-03-04 NOTE — Discharge Instructions (Signed)
Residential treatment for substance abuse   [facility] [address] [place] [address] Phone: [phone]  [facility] Address: [address], [place], [place] [address] Phone: [phone]  [facility] Address: [address], [place], [place] [address] Phone: [phone]
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
## 2017-03-04 NOTE — Progress Notes (Signed)
  [facility] :  Will you be returning to the same living situation after discharge:  Yes,  with friend At discharge, do you have transportation home?: Yes,  friend Do you have the ability to pay for your medications: No. [place] resident. No medication management clinic option.  Release of information consent forms completed and in the chart;  Patient's signature needed at discharge.  Patient to Follow up at: Follow-up Information    [facility]. Go in 7 day(s).   Why:  Please attend a walk in appointment between 9am-2pm, Monday-Friday to initiate services at [facility].  Please bring a copy of your hospital discharge paperwork. Contact information: [address] [place] [address] P: [phone] F: [phone]          Next level of care provider has access to [facility] Link:no  Safety Planning and Suicide Prevention discussed: No. Pt refused.  Have you used any form of tobacco in the last 30 days? (Cigarettes, Smokeless Tobacco, Cigars, and/or Pipes): Yes  Has patient been referred to the Quitline?: Patient refused referral  Patient has been referred for addiction treatment: Yes  Lorri FrederickWierda, Aeneas Longsworth Jon, LCSW 03/04/2017, 1:36 PM

## 2017-03-04 NOTE — Progress Notes (Signed)
Pt calm, cooperative and pleasant.  Accepted breakfast then AM meds, then reported she became nauseous and vomited.  RN visualized vomit - undigested food.  No pills visualized.  Pt reports after vomiting she no longer feels nauseous.  Denies SI/HI/AVH.   Discharge orders placed.  Reviewed discharge instructions with pt including follow up appts, prescriptions and crisis help lines.  Pt verbalized understanding.  All belongings returned to pt.  Escorted off unit. No acute distress.

## 2017-03-04 NOTE — Progress Notes (Signed)
Patient ID: Lori Miller, female   DOB: [birth year], 36 y.o.   MRN: [id number] Sad, depressed, withdrawn, visible in the day area but not actively interacting with others, pleasant on approach, "I am up in one minute and down in another, my mood is all misplaced today; I hope to sleep more than 3 hours tonight..." Denied SI/HI/AVH.

## 2017-03-05 LAB — CULTURE, BLOOD (ROUTINE X 2)
CULTURE: NO GROWTH
Culture: NO GROWTH
SPECIAL REQUESTS: ADEQUATE

## 2017-03-05 LAB — HEMOGLOBIN A1C
Hgb A1c MFr Bld: 5.5 % (ref 4.8–5.6)
Mean Plasma Glucose: 111 mg/dL

## 2017-03-06 LAB — HIV-1 RNA QUANT-NO REFLEX-BLD
HIV 1 RNA Quant: <20 copies/mL
LOG10 HIV-1 RNA: UNDETERMINED log10copy/mL

## 2017-06-21 ENCOUNTER — Other Ambulatory Visit: Payer: Self-pay

## 2017-06-21 ENCOUNTER — Emergency Department
Admission: EM | Admit: 2017-06-21 | Discharge: 2017-06-22 | Disposition: A | Discharge status: Home / Self Care | Payer: Medicaid Other | Attending: Emergency Medicine | Admitting: Emergency Medicine

## 2017-06-21 DIAGNOSIS — Z79899 Other long term (current) drug therapy: Secondary | ICD-10-CM | POA: Diagnosis not present

## 2017-06-21 DIAGNOSIS — F17210 Nicotine dependence, cigarettes, uncomplicated: Secondary | ICD-10-CM | POA: Diagnosis not present

## 2017-06-21 DIAGNOSIS — F419 Anxiety disorder, unspecified: Secondary | ICD-10-CM | POA: Insufficient documentation

## 2017-06-21 DIAGNOSIS — F1910 Other psychoactive substance abuse, uncomplicated: Secondary | ICD-10-CM | POA: Insufficient documentation

## 2017-06-21 DIAGNOSIS — R45850 Homicidal ideations: Secondary | ICD-10-CM | POA: Diagnosis not present

## 2017-06-21 LAB — CBC
HEMATOCRIT: 45.8 % (ref 35.0–47.0)
HEMOGLOBIN: 15.3 g/dL (ref 12.0–16.0)
MCH: 29.9 pg (ref 26.0–34.0)
MCHC: 33.3 g/dL (ref 32.0–36.0)
MCV: 89.8 fL (ref 80.0–100.0)
Platelets: 294 10*3/uL (ref 150–440)
RBC: 5.1 MIL/uL (ref 3.80–5.20)
RDW: 13.9 % (ref 11.5–14.5)
WBC: 10.3 10*3/uL (ref 3.6–11.0)

## 2017-06-21 LAB — COMPREHENSIVE METABOLIC PANEL
ALK PHOS: 37 U/L — AB (ref 38–126)
ALT: 13 U/L — ABNORMAL LOW (ref 14–54)
ANION GAP: 9 (ref 5–15)
AST: 21 U/L (ref 15–41)
Albumin: 4.7 g/dL (ref 3.5–5.0)
BILIRUBIN TOTAL: 0.7 mg/dL (ref 0.3–1.2)
BUN: 17 mg/dL (ref 6–20)
CHLORIDE: 106 mmol/L (ref 101–111)
CO2: 26 mmol/L (ref 22–32)
Calcium: 9.7 mg/dL (ref 8.9–10.3)
Creatinine, Ser: 1.11 mg/dL — ABNORMAL HIGH (ref 0.44–1.00)
GLUCOSE: 109 mg/dL — AB (ref 65–99)
POTASSIUM: 3 mmol/L — AB (ref 3.5–5.1)
Sodium: 141 mmol/L (ref 135–145)
Total Protein: 7.5 g/dL (ref 6.5–8.1)

## 2017-06-21 LAB — URINE DRUG SCREEN, QUALITATIVE (ARMC ONLY)
Amphetamines, Ur Screen: NOT DETECTED
BARBITURATES, UR SCREEN: NOT DETECTED
Benzodiazepine, Ur Scrn: POSITIVE — AB
COCAINE METABOLITE, UR ~~LOC~~: POSITIVE — AB
Cannabinoid 50 Ng, Ur ~~LOC~~: POSITIVE — AB
MDMA (ECSTASY) UR SCREEN: NOT DETECTED
METHADONE SCREEN, URINE: NOT DETECTED
OPIATE, UR SCREEN: NOT DETECTED
Phencyclidine (PCP) Ur S: NOT DETECTED
TRICYCLIC, UR SCREEN: POSITIVE — AB

## 2017-06-21 LAB — ETHANOL

## 2017-06-21 MED ORDER — POTASSIUM CHLORIDE CRYS ER 20 MEQ PO TBCR
20.0000 meq | EXTENDED_RELEASE_TABLET | Freq: Once | ORAL | Status: AC
  Administered 2017-06-22: 20 meq via ORAL
  Filled 2017-06-21: qty 1

## 2017-06-21 MED ORDER — ALPRAZOLAM 0.5 MG PO TABS
0.5000 mg | ORAL_TABLET | Freq: Once | ORAL | Status: AC
  Administered 2017-06-21: 0.5 mg via ORAL

## 2017-06-21 MED ORDER — ACETAMINOPHEN 325 MG PO TABS
650.0000 mg | ORAL_TABLET | Freq: Once | ORAL | Status: AC
  Administered 2017-06-21: 650 mg via ORAL
  Filled 2017-06-21: qty 2

## 2017-06-21 MED ORDER — ALPRAZOLAM 0.5 MG PO TABS
ORAL_TABLET | ORAL | Status: AC
  Filled 2017-06-21: qty 1

## 2017-06-21 NOTE — ED Notes (Signed)
BEHAVIORAL HEALTH ROUNDING  Patient sleeping: No.  Patient alert and oriented: yes  Behavior appropriate: Yes. ; If no, describe:  Nutrition and fluids offered: Yes  Toileting and hygiene offered: Yes  Sitter present: not applicable, Q 15 min safety rounds and observation.  Law enforcement present: Yes ODS  

## 2017-06-21 NOTE — ED Notes (Signed)
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
## 2017-06-21 NOTE — ED Notes (Addendum)
Pt belongings collected, pt has wallet (no cash), cell phone and clothing. Necklace placed in urine cup and with pt belongings.

## 2017-06-21 NOTE — ED Provider Notes (Signed)
[facility] Emergency Department Provider Note ____________________________________________   First MD Initiated Contact with Patient 06/21/17 1924     (approximate)  I have reviewed the triage vital signs and the nursing notes.   HISTORY  Chief Complaint Drug Problem and Homicidal    HPI Lori Miller is a 36 y.o. female with past medical history as noted below, who presents with substance abuse, time course over the last 7 days, consisting of smoking crack, and associated with increased anxiety.  Patient states that she has been on a crack cocaine binge steadily for the last week, she has not been sleeping, and she is becoming increasingly anxious.  Patient states that she also had some thoughts of hurting others; she stated that she wanted to potentially harm the drug dealers.  She also reports possible auditory hallucinations.  She denies suicidal ideation.  Denies any other drug use besides the cocaine.  Patient states that she generally feels unwell and anxious, and she has a frontal headache, but denies any other specific medical symptoms.  Past Medical History:  Diagnosis Date  . Anxiety   . Anxiety   . Depression   . Depression   . Migraine     Patient Active Problem List   Diagnosis Date Noted  . Tobacco use disorder 02/27/2017  . Benzodiazepine withdrawal (HCC) 02/27/2017  . Cannabis use disorder, severe, dependence (HCC) 02/27/2017  . Cocaine use disorder, mild, abuse (HCC) 02/27/2017  . MDD (major depressive disorder), recurrent episode, moderate (HCC) 02/27/2017    Past Surgical History:  Procedure Laterality Date  . BUNIONECTOMY    . CYST EXCISION      Prior to Admission medications   Medication Sig Start Date End Date Taking? Authorizing Provider  amitriptyline (ELAVIL) 25 MG tablet Take 1 tablet (25 mg total) by mouth at bedtime. 03/02/17   Jimmy FootmanHernandez-Gonzalez, Andrea, MD  buPROPion (WELLBUTRIN XL) 150 MG 24 hr tablet  Take 1 tablet (150 mg total) by mouth daily. 03/03/17   Jimmy FootmanHernandez-Gonzalez, Andrea, MD  hydrOXYzine (ATARAX/VISTARIL) 25 MG tablet Take 1 tablet (25 mg total) by mouth 3 (three) times daily as needed for anxiety. 03/04/17   Jimmy FootmanHernandez-Gonzalez, Andrea, MD    Allergies Flagyl [metronidazole]  No family history on file.  Social History Social History   Tobacco Use  . Smoking status: Current Every Day Smoker    Packs/day: 1.00    Types: Cigarettes  . Smokeless tobacco: Never Used  Substance Use Topics  . Alcohol use: No  . Drug use: Yes    Frequency: 6.0 times per week    Types: Cocaine, Marijuana, Benzodiazepines    Comment: marijuana daily; crack began last week;    Review of Systems  Constitutional: No fever/chills Eyes: No visual changes. ENT: No sore throat. Cardiovascular: Denies chest pain. Respiratory: Denies shortness of breath. Gastrointestinal: No nausea, no vomiting.  Genitourinary: Negative for dysuria.  Musculoskeletal: Negative for back pain. Skin: Negative for rash. Neurological: Positive for headache.   ____________________________________________   PHYSICAL EXAM:  VITAL SIGNS: ED Triage Vitals  Enc Vitals Group     BP 06/21/17 1830 117/72     Pulse --      Resp 06/21/17 1830 16     Temp 06/21/17 1830 98.7 F (37.1 C)     Temp Source 06/21/17 1830 Oral     SpO2 06/21/17 1830 97 %     Weight 06/21/17 1832 120 lb (54.4 kg)     Height 06/21/17 1832 5\' 4"  (  1.626 m)     Head Circumference --      Peak Flow --      Pain Score 06/21/17 1915 8     Pain Loc --      Pain Edu? --      Excl. in GC? --     Constitutional: Alert and oriented. Slightly anxious appearing, but in no acute distress. Eyes: Conjunctivae are normal. EOMI.  Head: Atraumatic. Nose: No congestion/rhinnorhea. Mouth/Throat: Mucous membranes are moist.   Neck: Normal range of motion.  Cardiovascular:  Good peripheral circulation. Respiratory: Normal respiratory effort.     Gastrointestinal:  No distention.  Genitourinary: No CVA tenderness. Musculoskeletal: Extremities warm and well perfused.  Neurologic:  Normal speech and language. No gross focal neurologic deficits are appreciated.  Skin:  Skin is warm and dry. No rash noted. Psychiatric: Mood and affect are normal. Speech and behavior are normal.  ____________________________________________   LABS (all labs ordered are listed, but only abnormal results are displayed)  Labs Reviewed  COMPREHENSIVE METABOLIC PANEL - Abnormal; Notable for the following components:      Result Value   Potassium 3.0 (*)    Glucose, Bld 109 (*)    Creatinine, Ser 1.11 (*)    ALT 13 (*)    Alkaline Phosphatase 37 (*)    All other components within normal limits  URINE DRUG SCREEN, QUALITATIVE ([facility] ONLY) - Abnormal; Notable for the following components:   Tricyclic, Ur Screen POSITIVE (*)    Cocaine Metabolite,Ur [place] POSITIVE (*)    Cannabinoid 50 Ng, Ur [place] POSITIVE (*)    Benzodiazepine, Ur Scrn POSITIVE (*)    All other components within normal limits  ETHANOL  CBC   ____________________________________________  EKG   ____________________________________________  RADIOLOGY    ____________________________________________   PROCEDURES  Procedure(s) performed: No    Critical Care performed: No ____________________________________________   INITIAL IMPRESSION / ASSESSMENT AND PLAN / ED COURSE  Pertinent labs & imaging results that were available during my care of the patient were reviewed by me and considered in my medical decision making (see chart for details).  36 year old female with past medical history as noted above presents requesting evaluation for substance abuse after a crack cocaine binge over the last week, as well as reporting homicidal ideation and possible vague hallucinations.  Patient has a frontal headache, but denies any other acute medical symptoms.  Review of past medical  records in Epic reveals an admission for depression and substance abuse in July of this year.  Vital signs are normal, and physical exam is unremarkable.  Plan[facility], and reassess.   ----------------------------------------- 11:51 PM on 06/21/2017 -----------------------------------------   Awaiting SOC evaluation.  Signed out to Dr. Zenda AlpersWebster.      ____________________________________________   FINAL CLINICAL IMPRESSION(S) / ED DIAGNOSES  Final diagnoses:  Substance abuse (HCC)  Anxiety      NEW MEDICATIONS STARTED DURING THIS VISIT:  This SmartLink is deprecated. Use AVSMEDLIST instead to display the medication list for a patient.   Note:  This document was prepared using Dragon voice recognition software and may include unintentional dictation errors.    Dionne BucySiadecki, James Lafalce, MD 06/21/17 2351

## 2017-06-21 NOTE — ED Notes (Signed)
[facility] Rm [facility]. Camera placed and process explained to pt who is understanding. Pt has ODS officer with her.

## 2017-06-21 NOTE — ED Triage Notes (Signed)
Pt reports has been using crack cocaine for past 7 days. Denies alcohol use. Denies thoughts of harming herself but reports wants to hurt others, specifically the "drug dealers." Reports is homeless. History of anxiety/depression. Not currently on meds.

## 2017-06-22 NOTE — ED Provider Notes (Signed)
-----------------------------------------   12:47 AM on 06/22/2017 -----------------------------------------   Blood pressure 118/72, temperature 97.6 F (36.4 C), temperature source Oral, resp. rate 16, height 5\' 4"  (1.626 m), weight 54.4 kg (120 lb), last menstrual period 06/19/2017, SpO2 97 %.  Assuming care from Dr. Marisa SeverinSiadecki.  In short, Lori Miller is a 36 y.o. female with a chief complaint of Drug Problem and Homicidal .  Refer to the original H&P for additional details.  The current plan of care is to follow up the results of the specialist on call.     The patient was evaluated by the specialist on-call for psychiatry. After the patient was evaluated and it was determined that she had no delusional thought process or cognitive impairment. She has been on a drug binge and would benefit from a drug referral program. The patient does not meet criteria for involuntary commitment according to psychiatry. The patient has been psychiatrically cleared for discharge. The patient will be discharged and she will be referred to [facility] and [facility].   Rebecka ApleyWebster, Jovanna Hodges P, MD 06/22/17 [phone]

## 2017-06-22 NOTE — ED Notes (Signed)
BEHAVIORAL HEALTH ROUNDING  Patient sleeping: No.  Patient alert and oriented: yes  Behavior appropriate: Yes. ; If no, describe:  Nutrition and fluids offered: Yes  Toileting and hygiene offered: Yes  Sitter present: not applicable, Q 15 min safety rounds and observation.  Law enforcement present: Yes ODS  

## 2017-06-22 NOTE — ED Notes (Signed)
Report given to SOC MD.  

## 2017-06-22 NOTE — Discharge Instructions (Signed)
Please follow up with [facility] and with [facility]

## 2017-06-29 ENCOUNTER — Other Ambulatory Visit: Payer: Self-pay

## 2017-06-29 ENCOUNTER — Emergency Department: Payer: Medicaid Other

## 2017-06-29 ENCOUNTER — Emergency Department
Admission: EM | Admit: 2017-06-29 | Discharge: 2017-06-29 | Disposition: A | Discharge status: Home / Self Care | Payer: Medicaid Other | Attending: Student in an Organized Health Care Education/Training Program | Admitting: Student in an Organized Health Care Education/Training Program

## 2017-06-29 ENCOUNTER — Encounter: Payer: Self-pay | Admitting: Emergency Medicine

## 2017-06-29 DIAGNOSIS — Z79899 Other long term (current) drug therapy: Secondary | ICD-10-CM | POA: Diagnosis not present

## 2017-06-29 DIAGNOSIS — S81811A Laceration without foreign body, right lower leg, initial encounter: Secondary | ICD-10-CM

## 2017-06-29 DIAGNOSIS — S0990XA Unspecified injury of head, initial encounter: Secondary | ICD-10-CM | POA: Diagnosis present

## 2017-06-29 DIAGNOSIS — F17210 Nicotine dependence, cigarettes, uncomplicated: Secondary | ICD-10-CM | POA: Insufficient documentation

## 2017-06-29 DIAGNOSIS — Y939 Activity, unspecified: Secondary | ICD-10-CM | POA: Insufficient documentation

## 2017-06-29 DIAGNOSIS — Y999 Unspecified external cause status: Secondary | ICD-10-CM | POA: Insufficient documentation

## 2017-06-29 DIAGNOSIS — Y929 Unspecified place or not applicable: Secondary | ICD-10-CM | POA: Insufficient documentation

## 2017-06-29 LAB — CBC WITH DIFFERENTIAL/PLATELET
Basophils Absolute: 0 10*3/uL (ref 0–0.1)
Basophils Relative: 0 %
Eosinophils Absolute: 0.1 10*3/uL (ref 0–0.7)
Eosinophils Relative: 1 %
HEMATOCRIT: 39.8 % (ref 35.0–47.0)
Hemoglobin: 13.3 g/dL (ref 12.0–16.0)
LYMPHS ABS: 1.2 10*3/uL (ref 1.0–3.6)
LYMPHS PCT: 10 %
MCH: 30.1 pg (ref 26.0–34.0)
MCHC: 33.5 g/dL (ref 32.0–36.0)
MCV: 89.8 fL (ref 80.0–100.0)
MONO ABS: 0.6 10*3/uL (ref 0.2–0.9)
MONOS PCT: 5 %
NEUTROS ABS: 10.6 10*3/uL — AB (ref 1.4–6.5)
Neutrophils Relative %: 84 %
Platelets: 241 10*3/uL (ref 150–440)
RBC: 4.43 MIL/uL (ref 3.80–5.20)
RDW: 14.2 % (ref 11.5–14.5)
WBC: 12.5 10*3/uL — ABNORMAL HIGH (ref 3.6–11.0)

## 2017-06-29 LAB — BASIC METABOLIC PANEL
Anion gap: 12 (ref 5–15)
BUN: 10 mg/dL (ref 6–20)
CALCIUM: 9.5 mg/dL (ref 8.9–10.3)
CO2: 23 mmol/L (ref 22–32)
CREATININE: 1.19 mg/dL — AB (ref 0.44–1.00)
Chloride: 103 mmol/L (ref 101–111)
GFR calc Af Amer: >60 mL/min (ref >60)
GFR calc non Af Amer: 57 mL/min — ABNORMAL LOW (ref >60)
GLUCOSE: 97 mg/dL (ref 65–99)
Potassium: 4 mmol/L (ref 3.5–5.1)
Sodium: 138 mmol/L (ref 135–145)

## 2017-06-29 LAB — URINE DRUG SCREEN, QUALITATIVE (ARMC ONLY)
AMPHETAMINES, UR SCREEN: NOT DETECTED
BARBITURATES, UR SCREEN: NOT DETECTED
BENZODIAZEPINE, UR SCRN: NOT DETECTED
COCAINE METABOLITE, UR ~~LOC~~: POSITIVE — AB
Cannabinoid 50 Ng, Ur ~~LOC~~: POSITIVE — AB
MDMA (Ecstasy)Ur Screen: NOT DETECTED
Methadone Scn, Ur: NOT DETECTED
OPIATE, UR SCREEN: NOT DETECTED
PHENCYCLIDINE (PCP) UR S: NOT DETECTED
Tricyclic, Ur Screen: NOT DETECTED

## 2017-06-29 LAB — POCT PREGNANCY, URINE: Preg Test, Ur: NEGATIVE

## 2017-06-29 LAB — ETHANOL: Alcohol, Ethyl (B): <10 mg/dL (ref <10)

## 2017-06-29 MED ORDER — LORAZEPAM 2 MG/ML IJ SOLN: 1.0000 mg | Freq: Once | INTRAMUSCULAR | Status: DC

## 2017-06-29 MED ORDER — IOPAMIDOL (ISOVUE-300) INJECTION 61%
75.0000 mL | Freq: Once | INTRAVENOUS | Status: AC | PRN
  Administered 2017-06-29: 75 mL via INTRAVENOUS

## 2017-06-29 NOTE — ED Notes (Signed)
Patient was assisted to dress, belongings placed in belongings bag. Patient took cola and pretzels with her. Patient was taken to the lobby in a wheelchair to wait for friend to pick her up. Patient's jewelry is in a specimen bag and placed in her duffel bag, along with her wallet and cell phone.

## 2017-06-29 NOTE — ED Notes (Signed)
Pt returned from radiology at this time. Ethanol collected. Pt noted to wake up somewhat with this RN moving her arm, c/o pain then returns back to sleep. MD aware.
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
## 2017-06-29 NOTE — ED Notes (Signed)
This RN spoke with SANE nurse regarding patient care.

## 2017-06-29 NOTE — ED Notes (Signed)
Meal requested by Lillia AbedLindsay, SANE RN.
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
## 2017-06-29 NOTE — ED Notes (Signed)
Patient given ED sandwich tray and cola per Lillia AbedLindsay, SANE RN.

## 2017-06-29 NOTE — ED Notes (Signed)
Lillia AbedLindsay, SANE RN, is at bedside.

## 2017-06-29 NOTE — Discharge Instructions (Signed)
° ° ° ° °  [place]:  [id number] COMMUNICATIONS:  [id number]  Interpersonal Violence   Interpersonal Violence aka Domestic Violence is defined as violence between people who have had a personal relationship. For example, someone you have ever dated, been married to or in a domestic partnership with. Someone with whom you have a child in common, or a current  household member.  Does one or more of the following   attempts to cause bodily injury, or intentionally causes bodily injury;  places you or a member of your family or household in fear of imminent serious bodily injury;  continued harassment that rises to such a level as to inflict substantial emotional distress; or  commits any rape or sexual offense  You are not alone. Unfortunately domestic violence is very common. Domestic violence does not go away on its own and tends to get worse over time and more frequent. There are people who can help. There are resources included in these instructions. Evidence can be collected in case you want to notify law enforcement now or in the future. A forensic nurse can take photographs and create a medical/legal document of the incident. If you choose to report to law enforcement, they will request a copy of the chart which we can provide with your permission. We can call in social work or an advocate to help with safety planning and emergency placement in a shelter if you have no other safe options.  THE POLICE CAN HELP YOU:   Get to a safe place away from the violence.   Get information on how the court can help protect you against the violence.   Get necessary belongings from your home for you and your children.   Get copies of police reports about the violence.   File a complaint in criminal court.   Find where local criminal and family courts are located.  [facility] Can Help You  Safety Planning  Assistance with Shelter  Obtaining a  Engineer, maintenance (IT)rotective Order (50B)  Research officer, political partyCourt Advocacy and Accompaniment  Support Group  Music therapistLegal Assistance  Assistance with domestic violence related criminal charges  Child Chartered loss adjusterrotective Services  Supervised Visitation  Financial Assistance Enrollment  Job Readiness  Budget Counseling   Coaching and Mentoring  Call your local domestic violence program for additional information and support.   [facility] [place]   336-641-SAFE Crisis Line [phone] [facility] [place]   [phone] Crisis Line [phone] [facility] [place] [phone]  National Domestic Violence Abuse Hotline  [phone]
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
## 2017-06-29 NOTE — ED Notes (Signed)
Patient's jewelry is in a specimen bag and given to the patient. Patient's right calf was dressed with sterile gauze and kling.

## 2017-06-29 NOTE — ED Notes (Signed)
Pt taken for radiology at this time.

## 2017-07-01 NOTE — SANE Note (Signed)
ON 07/01/2017, AT APPROXIMATELY 1830 HOURS, I ATTEMPTED TO CONTACT THE PT VIA TELEPHONE ([phone]), IN REFERENCE TO SETTING UP A PASSPHRASE FOR THE PT'S CHART AND IMAGES TO BE EMAILED SECURELY THROUGH [employer] ([employer]).  HOWEVER, THE RECORDED MESSAGE ADVISED THAT THE NUMBER WAS NO LONGER IN SERVICE OR HAD BEEN DISCONNECTED.
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
## 2018-03-07 ENCOUNTER — Encounter: Payer: Self-pay | Admitting: Intensive Care

## 2018-03-07 ENCOUNTER — Emergency Department
Admission: EM | Admit: 2018-03-07 | Discharge: 2018-03-08 | Disposition: A | Discharge status: Other Acute Inpatient Hospital | Payer: Medicaid Other | Attending: Emergency Medicine | Admitting: Emergency Medicine

## 2018-03-07 ENCOUNTER — Other Ambulatory Visit: Payer: Self-pay

## 2018-03-07 DIAGNOSIS — F1420 Cocaine dependence, uncomplicated: Secondary | ICD-10-CM | POA: Diagnosis present

## 2018-03-07 DIAGNOSIS — Z7289 Other problems related to lifestyle: Secondary | ICD-10-CM

## 2018-03-07 DIAGNOSIS — R45850 Homicidal ideations: Secondary | ICD-10-CM | POA: Insufficient documentation

## 2018-03-07 DIAGNOSIS — F17210 Nicotine dependence, cigarettes, uncomplicated: Secondary | ICD-10-CM | POA: Insufficient documentation

## 2018-03-07 DIAGNOSIS — IMO0002 Reserved for concepts with insufficient information to code with codable children: Secondary | ICD-10-CM

## 2018-03-07 DIAGNOSIS — F332 Major depressive disorder, recurrent severe without psychotic features: Secondary | ICD-10-CM | POA: Insufficient documentation

## 2018-03-07 DIAGNOSIS — F419 Anxiety disorder, unspecified: Secondary | ICD-10-CM | POA: Insufficient documentation

## 2018-03-07 DIAGNOSIS — F1220 Cannabis dependence, uncomplicated: Secondary | ICD-10-CM | POA: Insufficient documentation

## 2018-03-07 DIAGNOSIS — F1910 Other psychoactive substance abuse, uncomplicated: Secondary | ICD-10-CM | POA: Insufficient documentation

## 2018-03-07 DIAGNOSIS — R45851 Suicidal ideations: Secondary | ICD-10-CM

## 2018-03-07 DIAGNOSIS — F1424 Cocaine dependence with cocaine-induced mood disorder: Secondary | ICD-10-CM | POA: Insufficient documentation

## 2018-03-07 HISTORY — DX: Post-traumatic stress disorder, unspecified: F43.10

## 2018-03-07 LAB — COMPREHENSIVE METABOLIC PANEL
ALBUMIN: 4.5 g/dL (ref 3.5–5.0)
ALT: 14 U/L (ref 0–44)
AST: 20 U/L (ref 15–41)
Alkaline Phosphatase: 40 U/L (ref 38–126)
Anion gap: 7 (ref 5–15)
BUN: 18 mg/dL (ref 6–20)
CO2: 27 mmol/L (ref 22–32)
CREATININE: 1.16 mg/dL — AB (ref 0.44–1.00)
Calcium: 9.1 mg/dL (ref 8.9–10.3)
Chloride: 104 mmol/L (ref 98–111)
GFR calc Af Amer: >60 mL/min (ref >60)
GFR, EST NON AFRICAN AMERICAN: 59 mL/min — AB (ref >60)
GLUCOSE: 75 mg/dL (ref 70–99)
Potassium: 4.1 mmol/L (ref 3.5–5.1)
Sodium: 138 mmol/L (ref 135–145)
Total Bilirubin: 0.7 mg/dL (ref 0.3–1.2)
Total Protein: 7.4 g/dL (ref 6.5–8.1)

## 2018-03-07 LAB — CBC
HCT: 37.7 % (ref 35.0–47.0)
HEMOGLOBIN: 13.1 g/dL (ref 12.0–16.0)
MCH: 31.7 pg (ref 26.0–34.0)
MCHC: 34.7 g/dL (ref 32.0–36.0)
MCV: 91.4 fL (ref 80.0–100.0)
PLATELETS: 284 10*3/uL (ref 150–440)
RBC: 4.13 MIL/uL (ref 3.80–5.20)
RDW: 14.3 % (ref 11.5–14.5)
WBC: 7.4 10*3/uL (ref 3.6–11.0)

## 2018-03-07 LAB — URINE DRUG SCREEN, QUALITATIVE (ARMC ONLY)
Amphetamines, Ur Screen: NOT DETECTED
Barbiturates, Ur Screen: NOT DETECTED
Benzodiazepine, Ur Scrn: NOT DETECTED
CANNABINOID 50 NG, UR ~~LOC~~: POSITIVE — AB
COCAINE METABOLITE, UR ~~LOC~~: POSITIVE — AB
MDMA (ECSTASY) UR SCREEN: NOT DETECTED
Methadone Scn, Ur: NOT DETECTED
OPIATE, UR SCREEN: NOT DETECTED
Phencyclidine (PCP) Ur S: NOT DETECTED
Tricyclic, Ur Screen: POSITIVE — AB

## 2018-03-07 LAB — POCT PREGNANCY, URINE: Preg Test, Ur: NEGATIVE

## 2018-03-07 LAB — SALICYLATE LEVEL: Salicylate Lvl: <7 mg/dL (ref 2.8–30.0)

## 2018-03-07 LAB — ETHANOL

## 2018-03-07 LAB — HCG, QUANTITATIVE, PREGNANCY: hCG, Beta Chain, Quant, S: <1 m[IU]/mL (ref <5)

## 2018-03-07 LAB — ACETAMINOPHEN LEVEL: Acetaminophen (Tylenol), Serum: <10 ug/mL — ABNORMAL LOW (ref 10–30)

## 2018-03-07 MED ORDER — FOLIC ACID 1 MG PO TABS
1.0000 mg | ORAL_TABLET | Freq: Every day | ORAL | Status: DC
  Administered 2018-03-07 – 2018-03-08 (×2): 1 mg via ORAL
  Filled 2018-03-07 (×2): qty 1

## 2018-03-07 MED ORDER — VITAMIN B-1 100 MG PO TABS
100.0000 mg | ORAL_TABLET | Freq: Every day | ORAL | Status: DC
  Administered 2018-03-07 – 2018-03-08 (×2): 100 mg via ORAL
  Filled 2018-03-07 (×2): qty 1

## 2018-03-07 MED ORDER — CLONAZEPAM 0.5 MG PO TABS
1.0000 mg | ORAL_TABLET | Freq: Once | ORAL | Status: AC
  Administered 2018-03-07: 1 mg via ORAL
  Filled 2018-03-07: qty 2

## 2018-03-07 MED ORDER — LORAZEPAM 2 MG PO TABS
2.0000 mg | ORAL_TABLET | Freq: Once | ORAL | Status: AC
  Administered 2018-03-07: 2 mg via ORAL
  Filled 2018-03-07: qty 1

## 2018-03-07 MED ORDER — QUETIAPINE FUMARATE 25 MG PO TABS
100.0000 mg | ORAL_TABLET | Freq: Three times a day (TID) | ORAL | Status: DC
  Administered 2018-03-07 – 2018-03-08 (×3): 100 mg via ORAL
  Filled 2018-03-07 (×3): qty 4

## 2018-03-07 NOTE — ED Notes (Signed)
Patient talking to the wall, as if she were having a conversation with herself, laying in the bed

## 2018-03-07 NOTE — ED Notes (Signed)
Patient assigned to appropriate care area   Introduced self to pt  Patient oriented to unit/care area: Informed that, for their safety, care areas are designed for safety and visiting and phone hours explained to patient. Patient verbalizes understanding, and verbal contract for safety obtained  Environment secured  Pt appears to be very paranoid, she keeps looking over her shouler  Patient states "I am suicidal and have been thinking about it but haven't done anything yet. I have been on cocaine for the last three months" Yesterday was the last time patient had cocaine.

## 2018-03-07 NOTE — ED Triage Notes (Signed)
Patient states "I am suicidal and have been thinking about it but haven't done anything yet. I have been on cocaine for the last three months" Yesterday was the last time patient had cocaine. When asked if patient feels like hurting others she stated "yeah"

## 2018-03-07 NOTE — ED Triage Notes (Signed)
First Nurse Note:  C/O having feelings of wanting to cut self.

## 2018-03-07 NOTE — ED Notes (Signed)
Pt given meal tray.

## 2018-03-07 NOTE — ED Provider Notes (Signed)
[facility] Emergency Department Provider Note  ____________________________________________   First MD Initiated Contact with Patient 03/07/18 1557     (approximate)  I have reviewed the triage vital signs and the nursing notes.   HISTORY  Chief Complaint Suicidal    HPI Lori Miller is a 37 y.o. female With extensive psychiatric history as listed below including poly-substance abuse who presents for evaluation of suicidal ideation and homicidal ideation.  She says that she has been addicted to crack cocaine for an extended period of time and has been drinking more alcohol than usual recently.  She says she feels hopeless and is tired of feeling this way and wants to kill her drug dealer and then kill herself.  She plans to either cut herself with a knife or "steal a gun" and use it on him and then herself.  She says the symptoms been gradually building up for over time but are now severe.  Nothing makes it better or worse.  She last used cocaine yesterday and last drink alcohol yesterday.  She denies any other medical symptoms or complaints at this time including denying fever/chills, chest pain, shortness of breath, nausea, vomiting, and abdominal pain.  She appears anxious and is having some picking behaviors, but is otherwise calm and cooperative.    Past Medical History:  Diagnosis Date  . Anxiety   . Anxiety   . Depression   . Depression   . Migraine   . PTSD (post-traumatic stress disorder)     Patient Active Problem List   Diagnosis Date Noted  . Tobacco use disorder 02/27/2017  . Benzodiazepine withdrawal (HCC) 02/27/2017  . Cannabis use disorder, severe, dependence (HCC) 02/27/2017  . Cocaine use disorder, mild, abuse (HCC) 02/27/2017  . MDD (major depressive disorder), recurrent episode, moderate (HCC) 02/27/2017    Past Surgical History:  Procedure Laterality Date  . BUNIONECTOMY    . CYST EXCISION      Prior to Admission  medications   Medication Sig Start Date End Date Taking? Authorizing Provider  amitriptyline (ELAVIL) 25 MG tablet Take 1 tablet (25 mg total) by mouth at bedtime. 03/02/17   Jimmy FootmanHernandez-Gonzalez, Andrea, MD  buPROPion (WELLBUTRIN XL) 150 MG 24 hr tablet Take 1 tablet (150 mg total) by mouth daily. 03/03/17   Jimmy FootmanHernandez-Gonzalez, Andrea, MD  hydrOXYzine (ATARAX/VISTARIL) 25 MG tablet Take 1 tablet (25 mg total) by mouth 3 (three) times daily as needed for anxiety. 03/04/17   Jimmy FootmanHernandez-Gonzalez, Andrea, MD  oxyCODONE-acetaminophen (PERCOCET/ROXICET) 5-325 MG per tablet Take 1-2 tablets by mouth every 4 (four) hours as needed for moderate pain. 12/29/13 03/09/15  Delories HeinzEgerton, Kathryn P, DPM    Allergies Flagyl [metronidazole]  History reviewed. No pertinent family history.  Social History Social History   Tobacco Use  . Smoking status: Current Every Day Smoker    Packs/day: 1.00    Types: Cigarettes  . Smokeless tobacco: Never Used  Substance Use Topics  . Alcohol use: Yes  . Drug use: Yes    Frequency: 6.0 times per week    Types: Cocaine, Marijuana, Benzodiazepines    Comment: marijuana daily; crack began last week;    Review of Systems Constitutional: No fever/chills Eyes: No visual changes. ENT: No sore throat. Cardiovascular: Denies chest pain. Respiratory: Denies shortness of breath. Gastrointestinal: No abdominal pain.  No nausea, no vomiting.  No diarrhea.  No constipation. Genitourinary: Negative for dysuria. Musculoskeletal: Negative for neck pain.  Negative for back pain. Integumentary: Negative for rash. Neurological:  Negative for headaches, focal weakness or numbness. Psychiatric:Depression with SI and HI with a plan to either cut herself with a knife or steal a gun  ____________________________________________   PHYSICAL EXAM:  VITAL SIGNS: ED Triage Vitals [03/07/18 1521]  Enc Vitals Group     BP 120/83     Pulse Rate 92     Resp 16     Temp 98.4 F (36.9 C)      Temp Source Oral     SpO2 99 %     Weight 63.5 kg (140 lb)     Height 1.626 m (5\' 4" )     Head Circumference      Peak Flow      Pain Score 0     Pain Loc      Pain Edu?      Excl. in GC?     Constitutional: Alert and oriented.  Somewhat disheveled, anxious and fidgety Eyes: Conjunctivae are normal.  Head: Atraumatic. Nose: No congestion/rhinnorhea. Mouth/Throat: Mucous membranes are moist. Neck: No stridor.  No meningeal signs.   Cardiovascular: Normal rate, regular rhythm. Good peripheral circulation. Grossly normal heart sounds. Respiratory: Normal respiratory effort.  No retractions. Lungs CTAB. Gastrointestinal: Soft and nontender. No distention.  Musculoskeletal: No lower extremity tenderness nor edema. No gross deformities of extremities. Neurologic:  Normal speech and language. No gross focal neurologic deficits are appreciated.  Skin:  Skin is warm, dry and intact. No rash noted. Psychiatric: Mood and affect are anxious and agitated.  Endorses ongoing SI/HI with a plan.  ____________________________________________   LABS (all labs ordered are listed, but only abnormal results are displayed)  Labs Reviewed  COMPREHENSIVE METABOLIC PANEL - Abnormal; Notable for the following components:      Result Value   Creatinine, Ser 1.16 (*)    GFR calc non Af Amer 59 (*)    All other components within normal limits  ACETAMINOPHEN LEVEL - Abnormal; Notable for the following components:   Acetaminophen (Tylenol), Serum <10 (*)    All other components within normal limits  URINE DRUG SCREEN, QUALITATIVE ([facility] ONLY) - Abnormal; Notable for the following components:   Tricyclic, Ur Screen POSITIVE (*)    Cocaine Metabolite,Ur [place] POSITIVE (*)    Cannabinoid 50 Ng, Ur [place] POSITIVE (*)    All other components within normal limits  ETHANOL  SALICYLATE LEVEL  CBC  HCG, QUANTITATIVE, PREGNANCY  POCT PREGNANCY, URINE  POC URINE PREG, ED    ____________________________________________  EKG  None - EKG not ordered by ED physician ____________________________________________  RADIOLOGY   ED MD interpretation: No indication for imaging  Official radiology report(s): No results found.  ____________________________________________   PROCEDURES  Critical Care performed: No   Procedure(s) performed:   Procedures   ____________________________________________   INITIAL IMPRESSION / ASSESSMENT AND PLAN / ED COURSE  As part of my medical decision making, I reviewed the following data within the electronic MEDICAL RECORD NUMBER Nursing notes reviewed and incorporated, Labs reviewed , A consult was requested and obtained from this/these consultant(s) (Psych) and Notes from prior ED visits    Differential diagnosis includes, but is not limited to, depression, suicidal and homicidal ideation, substance-induced mood disorder, polysubstance abuse.  I have placed the patient under involuntary commitment.  She has no medical complaints or concerns at this time.  For her agitation I am administering Ativan 2 mg by mouth and put her on the CIWA protocol as I think she could be exhibiting some signs of alcohol  withdrawal.  Lab work is generally reassuring other than a urine drug screen positive for tricyclics, cocaine, and cannabinoids.  Telepsych consultation is pending.  Clinical Course as of Mar 08 33  Wynelle Link Mar 07, 2018  2000 I read the report from the psychiatrist who recommends inpatient treatment and feels that she does not meet IVC criteria.  He has medication recommendations that I have ordered (Seroquel, Klonopin, folate, thiamine).  They recommend inpatient treatment and psych/TTS is aware.   [CF]  2004 Of note, I decided to order the folic acid 1 mg by mouth daily instead of twice daily as recommended in the instructions.   [CF]    Clinical Course User Index [CF] Loleta Rose, MD     ____________________________________________  FINAL CLINICAL IMPRESSION(S) / ED DIAGNOSES  Final diagnoses:  Suicidal ideation  Cocaine dependence with cocaine-induced mood disorder (HCC)  Homicidal ideation  Polysubstance abuse (HCC)  Severe episode of recurrent major depressive disorder, without psychotic features (HCC)     MEDICATIONS GIVEN DURING THIS VISIT:  Medications  QUEtiapine (SEROQUEL) tablet 100 mg (100 mg Oral Given 03/07/18 2207)  thiamine (VITAMIN B-1) tablet 100 mg (100 mg Oral Given 03/07/18 2208)  folic acid (FOLVITE) tablet 1 mg (1 mg Oral Given 03/07/18 2207)  LORazepam (ATIVAN) tablet 2 mg (2 mg Oral Given 03/07/18 1706)  clonazePAM (KLONOPIN) tablet 1 mg (1 mg Oral Given 03/07/18 2207)     ED Discharge Orders    None       Note:  This document was prepared using Dragon voice recognition software and may include unintentional dictation errors.    Loleta Rose, MD 03/08/18 [phone]

## 2018-03-07 NOTE — BH Assessment (Signed)
Assessment Note  Lori Miller is an 37 y.o. female endorsing suicidal/homicidal ideations. Pt reports "I've been smoking crack every day for the past 3 months. It makes me want to kill myself. I want to kill the drug dealers that were selling to me". She voiced having a plan to "go to my dad's house to get his pistol and shoot them ... Its about 5 of them" (pt refused to provide this Clinical research associate with the names of these individuals). She voiced a plan to "use a knife to cut myself ... I watched a movie on how to slit your wrist ... Yesterday I stabbed myself in the palm of my hand". She reports her boyfriend intervened at the time when she was trying to stab herself in the hand.   Patient's last use of crack cocaine was 03/06/2018 - using $220.00 worth. She initially tried cocaine (powder) when she was 37yo. She currently exhibits agitation, restlessness, and reports having cravings. She has history of past substance use treatment with [facility] and [facility]. She is linked with Dr. Birdie Sons with [facility] for medication management. She demonstrates immense guilt and shame as she became tearful throughout assessment with this Clinical research associate. She reports symptoms of anxiety and depression. Patient has poor sleep patterns, getting 2-3 hours of sleep for the past 3 months and increased appetite. Legal involvement - probation in [place], [place] - with upcoming court date: 04/06/2018.   Diagnosis:  Unspecified Depressive Disorder Cocaine Use Disorder, Severe PTSD, by history  Past Medical History:  Past Medical History:  Diagnosis Date  . Anxiety   . Anxiety   . Depression   . Depression   . Migraine   . PTSD (post-traumatic stress disorder)     Past Surgical History:  Procedure Laterality Date  . BUNIONECTOMY    . CYST EXCISION      Family History: History reviewed. No pertinent family history.  Social History:  reports that she has been smoking cigarettes.  She has been smoking  about 1.00 pack per day. She has never used smokeless tobacco. She reports that she drinks alcohol. She reports that she has current or past drug history. Drugs: Cocaine, Marijuana, and Benzodiazepines. Frequency: 6.00 times per week.  Additional Social History:  Alcohol / Drug Use Pain Medications: SEE MAR Prescriptions: SEE MAR Over the Counter: SEE MAR History of alcohol / drug use?: Yes Longest period of sobriety (when/how long): UKN Negative Consequences of Use: Financial, Legal, Personal relationships Withdrawal Symptoms: Irritability, Agitation, Tremors Substance #1 Name of Substance 1: Crack Cocaine 1 - Age of First Use: 37yo-cocaine; 37yo-crack cocaine 1 - Amount (size/oz): "$220.00 worth" 1 - Frequency: "everyday for the last 3 months" 1 - Duration: "a year" 1 - Last Use / Amount: 03/06/2018  CIWA: CIWA-Ar BP: 105/67 Pulse Rate: 94 Nausea and Vomiting: 3 Tactile Disturbances: none Tremor: three Auditory Disturbances: very mild harshness or ability to frighten Paroxysmal Sweats: barely perceptible sweating, palms moist Visual Disturbances: mild sensitivity Anxiety: three Headache, Fullness in Head: mild Agitation: two Orientation and Clouding of Sensorium: oriented and can do serial additions CIWA-Ar Total: 17 COWS:    Allergies:  Allergies  Allergen Reactions  . Flagyl [Metronidazole] Other (See Comments)    Reaction:  Abdominal pain    Home Medications:  (Not in a hospital admission)  OB/GYN Status:  Patient's last menstrual period was 02/14/2018.  General Assessment Data Location of Assessment: [facility] ED TTS Assessment: In system Is this a Tele or Face-to-Face Assessment?:  Face-to-Face Is this an Initial Assessment or a Re-assessment for this encounter?: Initial Assessment Marital status: Long term relationship Maiden name: n/a Is patient pregnant?: No Pregnancy Status: No Living Arrangements: Spouse/significant other Can pt return to current living  arrangement?: Yes Admission Status: Involuntary Is patient capable of signing voluntary admission?: No Referral Source: Self/Family/Friend Insurance type: Medicaid  Medical Screening Exam [facility] Walk-in ONLY) Medical Exam completed: Yes  Crisis Care Plan Living Arrangements: Spouse/significant other Legal Guardian: Other:(Self) Name of Psychiatrist: Dr. Birdie SonsSonnenberg Name of Therapist: n/a  Education Status Is patient currently in school?: No Is the patient employed, unemployed or receiving disability?: Unemployed  Risk to self with the past 6 months Suicidal Ideation: Yes-Currently Present Has patient been a risk to self within the past 6 months prior to admission? : Yes(substance use) Suicidal Intent: Yes-Currently Present Has patient had any suicidal intent within the past 6 months prior to admission? : Yes(immense amount of guilt and shame) Is patient at risk for suicide?: Yes Suicidal Plan?: Yes-Currently Present Has patient had any suicidal plan within the past 6 months prior to admission? : Yes Specify Current Suicidal Plan: Pt has a plan to cut her wrist Access to Means: Yes Specify Access to Suicidal Means: Access to knives What has been your use of drugs/alcohol within the last 12 months?: Crack cocaine Previous Attempts/Gestures: Yes(Tried to stab herself in the palm of her hand using a knife) How many times?: 1 Other Self Harm Risks: Substance use/hx of cutting Triggers for Past Attempts: Other (Comment)(Substance use) Intentional Self Injurious Behavior: Cutting Comment - Self Injurious Behavior: Stabbing the palm of her hand with a knife Family Suicide History: Unknown Recent stressful life event(s): Conflict (Comment), Loss (Comment), Financial Problems, Legal Issues, Other (Comment)(Addiction; Strained relationship with her 19yo son) Persecutory voices/beliefs?: Yes("The drug dealers who try to pimp me out") Depression: Yes Depression Symptoms: Insomnia,  Tearfulness, Isolating, Fatigue, Guilt, Loss of interest in usual pleasures, Feeling worthless/self pity, Feeling angry/irritable Substance abuse history and/or treatment for substance abuse?: Yes Suicide prevention information given to non-admitted patients: Not applicable  Risk to Others within the past 6 months Homicidal Ideation: Yes-Currently Present Does patient have any lifetime risk of violence toward others beyond the six months prior to admission? : Unknown Thoughts of Harm to Others: Yes-Currently Present Comment - Thoughts of Harm to Others: Pt is having thoughts to harm her drug dealers Current Homicidal Intent: Yes-Currently Present Current Homicidal Plan: Yes-Currently Present Describe Current Homicidal Plan: She plans to shoot them with a pistol Access to Homicidal Means: Yes Describe Access to Homicidal Means: Her father's pistol Identified Victim: "Drug dealers" History of harm to others?: No Assessment of Violence: None Noted Violent Behavior Description: None Does patient have access to weapons?: Yes (Comment) Criminal Charges Pending?: Yes Describe Pending Criminal Charges: Probation Violation Does patient have a court date: Yes Court Date: 04/06/18 Is patient on probation?: Yes([place], [place])  Psychosis Hallucinations: Auditory("sometimes I hear stuff under my bed") Delusions: None noted  Mental Status Report Appearance/Hygiene: In scrubs Eye Contact: Good Motor Activity: Agitation, Restlessness Speech: Logical/coherent Level of Consciousness: Alert Mood: Depressed, Guilty, Worthless, low self-esteem Affect: Depressed Anxiety Level: Severe Thought Processes: Coherent, Relevant Judgement: Impaired Orientation: Person, Place, Time, Situation, Appropriate for developmental age Obsessive Compulsive Thoughts/Behaviors: None  Cognitive Functioning Concentration: Normal Memory: Recent Intact, Remote Intact Is patient IDD: No Is patient DD?:  No Insight: Poor Impulse Control: Poor Appetite: Good Have you had any weight changes? : Gain Amount of the  weight change? (lbs): 10 lbs Sleep: Decreased Total Hours of Sleep: 3 Vegetative Symptoms: None  ADLScreening [facility] Assessment Services) Patient's cognitive ability adequate to safely complete daily activities?: Yes Patient able to express need for assistance with ADLs?: Yes Independently performs ADLs?: Yes (appropriate for developmental age)  Prior Inpatient Therapy Prior Inpatient Therapy: Yes Prior Therapy Dates: 06/21/17; 02/26/17 Prior Therapy Facilty/Provider(s): [facility] Reason for Treatment: Depression/Substance use  Prior Outpatient Therapy Prior Outpatient Therapy: Yes Prior Therapy Dates: 2018-2019 Prior Therapy Facilty/Provider(s): [facility] Reason for Treatment: Depression/Substance use Does patient have an ACCT team?: No Does patient have Intensive In-House Services?  : No Does patient have [facility] services? : No Does patient have P4CC services?: No  ADL Screening (condition at time of admission) Patient's cognitive ability adequate to safely complete daily activities?: Yes Patient able to express need for assistance with ADLs?: Yes Independently performs ADLs?: Yes (appropriate for developmental age)       Abuse/Neglect Assessment (Assessment to be complete while patient is alone) Abuse/Neglect Assessment Can Be Completed: Yes Physical Abuse: Denies Verbal Abuse: Denies Sexual Abuse: Denies Exploitation of patient/patient's resources: Denies Self-Neglect: Denies Values / Beliefs Cultural Requests During Hospitalization: None Spiritual Requests During Hospitalization: None Consults Spiritual Care Consult Needed: No Social Work Consult Needed: No Merchant navy officer (For Healthcare) Does Patient Have a Medical Advance Directive?: No Would patient like information on creating a medical advance directive?: No - Patient declined     Additional Information 1:1 In Past 12 Months?: No CIRT Risk: No Elopement Risk: No Does patient have medical clearance?: Yes  Child/Adolescent Assessment Running Away Risk: (Patient is an adult)  Disposition:  Disposition Initial Assessment Completed for this Encounter: Yes Disposition of Patient: (Pending SOC Consult)  On Site Evaluation by:   Reviewed with Physician:    Wilmon Arms 03/07/2018 6:41 PM

## 2018-03-08 ENCOUNTER — Other Ambulatory Visit: Payer: Self-pay

## 2018-03-08 ENCOUNTER — Inpatient Hospital Stay
Admission: AD | Admit: 2018-03-08 | Discharge: 2018-03-18 | DRG: 885 | Payer: No Typology Code available for payment source | Source: Intra-hospital | Attending: Psychiatry | Admitting: Psychiatry

## 2018-03-08 DIAGNOSIS — IMO0002 Reserved for concepts with insufficient information to code with codable children: Secondary | ICD-10-CM

## 2018-03-08 DIAGNOSIS — R451 Restlessness and agitation: Secondary | ICD-10-CM | POA: Diagnosis present

## 2018-03-08 DIAGNOSIS — F429 Obsessive-compulsive disorder, unspecified: Secondary | ICD-10-CM | POA: Diagnosis present

## 2018-03-08 DIAGNOSIS — F332 Major depressive disorder, recurrent severe without psychotic features: Secondary | ICD-10-CM

## 2018-03-08 DIAGNOSIS — R45851 Suicidal ideations: Secondary | ICD-10-CM

## 2018-03-08 DIAGNOSIS — Z9114 Patient's other noncompliance with medication regimen: Secondary | ICD-10-CM | POA: Diagnosis not present

## 2018-03-08 DIAGNOSIS — F17210 Nicotine dependence, cigarettes, uncomplicated: Secondary | ICD-10-CM | POA: Diagnosis present

## 2018-03-08 DIAGNOSIS — F17200 Nicotine dependence, unspecified, uncomplicated: Secondary | ICD-10-CM | POA: Diagnosis present

## 2018-03-08 DIAGNOSIS — F10239 Alcohol dependence with withdrawal, unspecified: Secondary | ICD-10-CM | POA: Diagnosis present

## 2018-03-08 DIAGNOSIS — Z7289 Other problems related to lifestyle: Secondary | ICD-10-CM

## 2018-03-08 DIAGNOSIS — R45850 Homicidal ideations: Secondary | ICD-10-CM | POA: Diagnosis present

## 2018-03-08 DIAGNOSIS — F4310 Post-traumatic stress disorder, unspecified: Secondary | ICD-10-CM | POA: Diagnosis present

## 2018-03-08 DIAGNOSIS — F1420 Cocaine dependence, uncomplicated: Secondary | ICD-10-CM | POA: Diagnosis present

## 2018-03-08 DIAGNOSIS — G43909 Migraine, unspecified, not intractable, without status migrainosus: Secondary | ICD-10-CM | POA: Diagnosis present

## 2018-03-08 DIAGNOSIS — F1020 Alcohol dependence, uncomplicated: Secondary | ICD-10-CM | POA: Diagnosis present

## 2018-03-08 DIAGNOSIS — F1220 Cannabis dependence, uncomplicated: Secondary | ICD-10-CM | POA: Diagnosis present

## 2018-03-08 MED ORDER — HYDROXYZINE HCL 50 MG PO TABS
50.0000 mg | ORAL_TABLET | Freq: Three times a day (TID) | ORAL | Status: DC | PRN
  Administered 2018-03-08 – 2018-03-11 (×6): 50 mg via ORAL
  Filled 2018-03-08 (×6): qty 1

## 2018-03-08 MED ORDER — ACETAMINOPHEN 325 MG PO TABS
650.0000 mg | ORAL_TABLET | Freq: Four times a day (QID) | ORAL | Status: DC | PRN
  Administered 2018-03-16 – 2018-03-17 (×2): 650 mg via ORAL
  Filled 2018-03-08 (×3): qty 2

## 2018-03-08 MED ORDER — TRAZODONE HCL 100 MG PO TABS
100.0000 mg | ORAL_TABLET | Freq: Every evening | ORAL | Status: DC | PRN
  Administered 2018-03-08: 100 mg via ORAL
  Filled 2018-03-08: qty 1

## 2018-03-08 MED ORDER — MAGNESIUM HYDROXIDE 400 MG/5ML PO SUSP: 30.0000 mL | Freq: Every day | ORAL | Status: DC | PRN

## 2018-03-08 MED ORDER — ALUM & MAG HYDROXIDE-SIMETH 200-200-20 MG/5ML PO SUSP
30.0000 mL | ORAL | Status: DC | PRN
  Administered 2018-03-13 – 2018-03-17 (×5): 30 mL via ORAL
  Filled 2018-03-08 (×5): qty 30

## 2018-03-08 NOTE — ED Notes (Signed)
[facility] Report given to Nicole Cellaorothy, Charity fundraiserN.

## 2018-03-08 NOTE — Progress Notes (Addendum)
Patient ID: Lori RifeSamantha G Miller, female   DOB: 1981-03-24, 37 y.o.   MRN: [id number] 37 year old caucasian female admitted IVC for SI/HI and depression. Patient arrived on the unit prior to my arrival. Spoke with patient after she had eaten her dinner. Patient mood and affect is depressed, anxious, and hopeless. Reports smoking crack cocaine and marijuana daily x 3 months. Reports increasing ETOH usage over that time period as well. Reports SI due to hopelessness created by her addiction to cocaine. UDS +cocaine, THC, and tricyclics. Reports last usage on 03/06/2018. Reports mild S/Sx of WD. Reports HI towards her drug dealer. Reports plan to steal a gun from relative and shoot drug dealer and then herself. Also planned to stab self. Attempted to stab self in left hand. There is a small mark in palm of left hand. Reports VH of shapes. Reports being non-compliant with psychiatric medications Elavil, Vistaril, and Wellbutrin. Reports medical HX +migraine HA. Patient's significant other is reported as her social support. Patient reports she has no idea where she will go upon discharge but is interested in inpatient Tx. Reports having poor relationship with 37 year old child. Patient is tearful at times during assessment but cooperative and polite. Patient cooperative with admission process. Skin assessment and contraband search completed by prior shift. No contraband reported. Given Vistaril for anxiety and Trazodone for sleep. Both appear affective when reassessed. Q 15 minute checks initiated upon arrival. Patient provided hygiene products and linens. Will continue to monitor throughout the shift. Patient slept 8.5 hours. No apparent distress. Reports restlessness, sweating, and waking multiple times upon waking. Patient is polite and calm. Provided emotional support and encouragement to get clean. Will endorse care to oncoming shift.

## 2018-03-08 NOTE — ED Notes (Signed)
Pt states she came in due to having thoughts of hurting herself and that she actually stabbed herself on the left palm. Tiny puncture wound noted to left palm, no bleeding noted. Advised pt to keep area clean to avoid infection. Pt reports she has been using cocaine and alcohol daily for the last and MJ off and on for years. Pt able to contract for safety on assessment. Denies AH but endorsing VH of "stuff" including people standing near her and when she turns around no one is there. States she has been receiving outpatient psychiatric care for a long time but the last time she went to her appointment was months ago. Denies pain at this time.

## 2018-03-08 NOTE — Tx Team (Signed)
Initial Treatment Plan 03/08/2018 9:52 PM Lori Miller [id number]    PATIENT STRESSORS: Financial difficulties Marital or family conflict Medication change or noncompliance Occupational concerns Substance abuse   PATIENT STRENGTHS: Average or above average intelligence Capable of independent living Communication skills Motivation for treatment/growth   PATIENT IDENTIFIED PROBLEMS: Suicidal ideation 03/08/2018  Homicidal ideation 03/08/2018  Depression 03/08/2018  Substance Abuse 03/08/2018               DISCHARGE CRITERIA:  Adequate post-discharge living arrangements Improved stabilization in mood, thinking, and/or behavior Motivation to continue treatment in a less acute level of care Need for constant or close observation no longer present Reduction of life-threatening or endangering symptoms to within safe limits Verbal commitment to aftercare and medication compliance Withdrawal symptoms are absent or subacute and managed without 24-hour nursing intervention  PRELIMINARY DISCHARGE PLAN: Attend aftercare/continuing care group Attend 12-step recovery group Outpatient therapy Placement in alternative living arrangements  PATIENT/FAMILY INVOLVEMENT: This treatment plan has been presented to and reviewed with the patient, Lori Miller, and/or family member.  The patient and family have been given the opportunity to ask questions and make suggestions.  Galen ManilaAlexis E Nycholas Rayner, RN 03/08/2018, 9:52 PM

## 2018-03-08 NOTE — Progress Notes (Signed)
Report was received from Nicole Cellaorothy, RN at [phone] on patient. Patient was admitted on unit at [id number].

## 2018-03-08 NOTE — BH Assessment (Signed)
.  [facility] by Dr. Toni Amendlapacs.  Attending Physician will be Dr. Flora Lipps'Neal.   Patient [facility] room 304[facility] Nurse [facility].   Intake Paper Work has been signed and placed on patient chart.  ER staff is aware of the admission:  [facility]   Dr. Pershing ProudSchaevitz, ER MD   Nicole Cellaorothy Patient's Nurse   Alvira PhilipsQuicha Patient Access.

## 2018-03-08 NOTE — Plan of Care (Signed)
New Admit  Problem: Education: Goal: Knowledge of [facility] Education information/materials will improve Outcome: Progressing   Problem: Education: Goal: Knowledge of General Education information will improve Description Including pain rating scale, medication(s)/side effects and non-pharmacologic comfort measures Outcome: Not Progressing   Problem: Education: Goal: Emotional status will improve Outcome: Not Progressing Goal: Mental status will improve Outcome: Not Progressing Goal: Verbalization of understanding the information provided will improve Outcome: Not Progressing   Problem: Activity: Goal: Interest or engagement in activities will improve Outcome: Not Progressing Goal: Sleeping patterns will improve Outcome: Not Progressing   Problem: Coping: Goal: Ability to verbalize frustrations and anger appropriately will improve Outcome: Not Progressing Goal: Ability to demonstrate self-control will improve Outcome: Not Progressing   Problem: Health Behavior/Discharge Planning: Goal: Identification of resources available to assist in meeting health care needs will improve Outcome: Not Progressing Goal: Compliance with treatment plan for underlying cause of condition will improve Outcome: Not Progressing   Problem: Physical Regulation: Goal: Ability to maintain clinical measurements within normal limits will improve Outcome: Not Progressing   Problem: Safety: Goal: Periods of time without injury will increase Outcome: Not Progressing   Problem: Medication: Goal: Compliance with prescribed medication regimen will improve Outcome: Not Progressing   Problem: Self-Concept: Goal: Ability to disclose and discuss suicidal ideas will improve Outcome: Not Progressing Goal: Will verbalize positive feelings about self Outcome: Not Progressing   Problem: Education: Goal: Understanding of discharge needs will improve Outcome: Not Progressing   Problem:  Physical Regulation: Goal: Complications related to the disease process, condition or treatment will be avoided or minimized Outcome: Not Progressing

## 2018-03-08 NOTE — ED Notes (Signed)
[facility]. She will call and let us know when we can send pt down when room is ready.

## 2018-03-08 NOTE — ED Provider Notes (Signed)
-----------------------------------------   8:05 AM on 03/08/2018 -----------------------------------------   Blood pressure 110/70, pulse 92, temperature 98.1 F (36.7 C), temperature source Oral, resp. rate 18, height 5\' 4"  (1.626 m), weight 63.5 kg (140 lb), last menstrual period 02/14/2018, SpO2 100 %.  The patient had no acute events since last update.  Calm and cooperative at this time.  Disposition is pending Psychiatry/Behavioral Medicine team recommendations.     Myrna BlazerSchaevitz, David Matthew, MD 03/08/18 [phone]

## 2018-03-08 NOTE — ED Notes (Signed)
Pt asleep. Meal tray placed on bed.

## 2018-03-08 NOTE — Consult Note (Signed)
[facility] Consult   Reason for Consult: Consult for this 37 year old woman with a history of depression and substance abuse who comes to the emergency room seeking help with suicidal ideation Referring Physician: Clearnce Hasten Patient Identification: Lori Miller MRN:  [id number] Principal Diagnosis: Severe recurrent major depression without psychotic features [facility]) Diagnosis:   Patient Active Problem List   Diagnosis Date Noted  . Severe recurrent major depression without psychotic features ([place]) [F33.2] 03/08/2018  . Suicidal ideation [R45.851] 03/08/2018  . Self-inflicted injury [X21.19] 03/08/2018  . Tobacco use disorder [F17.200] 02/27/2017  . Benzodiazepine withdrawal ([place]) [F13.239] 02/27/2017  . Cannabis use disorder, severe, dependence ([place]) [F12.20] 02/27/2017  . Cocaine use disorder, mild, abuse ([place]) [F14.10] 02/27/2017  . MDD (major depressive disorder), recurrent episode, moderate ([place]) [F33.1] 02/27/2017    Total Time spent with patient: 1 hour  Subjective:   Lori Miller is a 37 y.o. female patient admitted with "I want to kill myself and other people".  HPI: Patient seen chart reviewed.  This is a 37 year old woman with a history of depression and substance abuse.  Says that her mood feels angry depressed sad and down pretty much all the time.  Sleep is erratic and poor.  Appetite poor.  Energy level poor.  Constantly having negative thoughts about herself.  Having suicidal thoughts about cutting herself.  She actually did stab herself superficially in the palm of her hand.  Also talks about having thoughts of killing other people although without any specificity.  Denies any current hallucinations.  Patient has been to [facility] in the past but is not currently taking any medication.  She smokes crack cocaine every day smokes marijuana frequently as well.  Drinking daily.  Feels out of control.  Social history: Patient had been living with  roommates.  Not working.  Alienated from family.  Medical history: Has acne and a superficial cut on her hand no other active medical problems  Substance abuse history: Long-standing problems with cocaine and cannabis and alcohol abuse.  Minimal sobriety.  Past Psychiatric History: Patient has had prior hospitalizations prior medication trials prior history of self-mutilation and suicide attempts.  Poor compliance with outpatient treatment.  Risk to Self: Suicidal Ideation: Yes-Currently Present Suicidal Intent: Yes-Currently Present Is patient at risk for suicide?: Yes Suicidal Plan?: Yes-Currently Present Specify Current Suicidal Plan: Pt has a plan to cut her wrist Access to Means: Yes Specify Access to Suicidal Means: Access to knives What has been your use of drugs/alcohol within the last 12 months?: Crack cocaine How many times?: 1 Other Self Harm Risks: Substance use/hx of cutting Triggers for Past Attempts: Other (Comment)(Substance use) Intentional Self Injurious Behavior: Cutting Comment - Self Injurious Behavior: Stabbing the palm of her hand with a knife Risk to Others: Homicidal Ideation: Yes-Currently Present Thoughts of Harm to Others: Yes-Currently Present Comment - Thoughts of Harm to Others: Pt is having thoughts to harm her drug dealers Current Homicidal Intent: Yes-Currently Present Current Homicidal Plan: Yes-Currently Present Describe Current Homicidal Plan: She plans to shoot them with a pistol Access to Homicidal Means: Yes Describe Access to Homicidal Means: Her father's pistol Identified Victim: "Drug dealers" History of harm to others?: No Assessment of Violence: None Noted Violent Behavior Description: None Does patient have access to weapons?: Yes (Comment) Criminal Charges Pending?: Yes Describe Pending Criminal Charges: Probation Violation Does patient have a court date: Yes Court Date: 04/06/18 Prior Inpatient Therapy: Prior Inpatient Therapy:  Yes Prior Therapy Dates: 06/21/17; 02/26/17 Prior  Therapy Facilty/Provider(s): [facility] Reason for Treatment: Depression/Substance use Prior Outpatient Therapy: Prior Outpatient Therapy: Yes Prior Therapy Dates: 2018-2019 Prior Therapy Facilty/Provider(s): [facility] [place] Reason for Treatment: Depression/Substance use Does patient have an ACCT team?: No Does patient have Intensive In-House Services?  : No Does patient have [facility] services? : No Does patient have P4CC services?: No  Past Medical History:  Past Medical History:  Diagnosis Date  . Anxiety   . Anxiety   . Depression   . Depression   . Migraine   . PTSD (post-traumatic stress disorder)     Past Surgical History:  Procedure Laterality Date  . BUNIONECTOMY    . CYST EXCISION     Family History: History reviewed. No pertinent family history. Family Psychiatric  History: Denies any family history Social History:  Social History   Substance and Sexual Activity  Alcohol Use Yes     Social History   Substance and Sexual Activity  Drug Use Yes  . Frequency: 6.0 times per week  . Types: Cocaine, Marijuana, Benzodiazepines   Comment: marijuana daily; crack began last week;    Social History   Socioeconomic History  . Marital status: Single    Spouse name: Not on file  . Number of children: Not on file  . Years of education: Not on file  . Highest education level: Not on file  Occupational History  . Not on file  Social Needs  . Financial resource strain: Not on file  . Food insecurity:    Worry: Not on file    Inability: Not on file  . Transportation needs:    Medical: Not on file    Non-medical: Not on file  Tobacco Use  . Smoking status: Current Every Day Smoker    Packs/day: 1.00    Types: Cigarettes  . Smokeless tobacco: Never Used  Substance and Sexual Activity  . Alcohol use: Yes  . Drug use: Yes    Frequency: 6.0 times per week    Types: Cocaine, Marijuana, Benzodiazepines     Comment: marijuana daily; crack began last week;  . Sexual activity: Yes  Lifestyle  . Physical activity:    Days per week: Not on file    Minutes per session: Not on file  . Stress: Not on file  Relationships  . Social connections:    Talks on phone: Not on file    Gets together: Not on file    Attends religious service: Not on file    Active member of club or organization: Not on file    Attends meetings of clubs or organizations: Not on file    Relationship status: Not on file  Other Topics Concern  . Not on file  Social History Narrative  . Not on file   Additional Social History:    Allergies:   Allergies  Allergen Reactions  . Flagyl [Metronidazole] Other (See Comments)    Reaction:  Abdominal pain    Labs:  Results for orders placed or performed during the hospital encounter of 03/07/18 (from the past 48 hour(s))  hCG, quantitative, pregnancy     Status: None   Collection Time: 03/07/18  3:23 PM  Result Value Ref Range   hCG, Beta Chain, Quant, S <1 <5 mIU/mL    Comment:          GEST. AGE      CONC.  (mIU/mL)   <=1 WEEK        5 - 50  2 WEEKS       50 - 500     3 WEEKS       100 - 10,000     4 WEEKS     1,000 - 30,000     5 WEEKS     3,500 - 115,000   6-8 WEEKS     12,000 - 270,000    12 WEEKS     15,000 - 220,000        FEMALE AND NON-PREGNANT FEMALE:     LESS THAN 5 mIU/mL Performed at [facility], [address] [place], [place] [address]   Comprehensive metabolic panel     Status: Abnormal   Collection Time: 03/07/18  3:25 PM  Result Value Ref Range   Sodium 138 135 - 145 mmol/L   Potassium 4.1 3.5 - 5.1 mmol/L   Chloride 104 98 - 111 mmol/L   CO2 27 22 - 32 mmol/L   Glucose, Bld 75 70 - 99 mg/dL   BUN 18 6 - 20 mg/dL   Creatinine, Ser 1.16 (H) 0.44 - 1.00 mg/dL   Calcium 9.1 8.9 - 10.3 mg/dL   Total Protein 7.4 6.5 - 8.1 g/dL   Albumin 4.5 3.5 - 5.0 g/dL   AST 20 15 - 41 U/L   ALT 14 0 - 44 U/L   Alkaline Phosphatase 40 38 -  126 U/L   Total Bilirubin 0.7 0.3 - 1.2 mg/dL   GFR calc non Af Amer 59 (L) >60 mL/min   GFR calc Af Amer >60 >60 mL/min    Comment: (NOTE) The eGFR has been calculated using the CKD EPI equation. This calculation has not been validated in all clinical situations. eGFR's persistently <60 mL/min signify possible Chronic Kidney Disease.    Anion gap 7 5 - 15    Comment: Performed at [facility], [address] [place], [place] [address]  Ethanol     Status: None   Collection Time: 03/07/18  3:25 PM  Result Value Ref Range   Alcohol, Ethyl (B) <10 <10 mg/dL    Comment: (NOTE) Lowest detectable limit for serum alcohol is 10 mg/dL. For medical purposes only. Performed at [facility], [address] [place], [place] [address]   Salicylate level     Status: None   Collection Time: 03/07/18  3:25 PM  Result Value Ref Range   Salicylate Lvl <9.7 2.8 - 30.0 mg/dL    Comment: Performed at [facility] [address] [place], [place] [address]  Acetaminophen level     Status: Abnormal   Collection Time: 03/07/18  3:25 PM  Result Value Ref Range   Acetaminophen (Tylenol), Serum <10 (L) 10 - 30 ug/mL    Comment: (NOTE) Therapeutic concentrations vary significantly. A range of 10-30 ug/mL  may be an effective concentration for many patients. However, some  are best treated at concentrations outside of this range. Acetaminophen concentrations >150 ug/mL at 4 hours after ingestion  and >50 ug/mL at 12 hours after ingestion are often associated with  toxic reactions. Performed at [facility], [address] [place], [place] [address]   cbc     Status: None   Collection Time: 03/07/18  3:25 PM  Result Value Ref Range   WBC 7.4 3.6 - 11.0 K/uL   RBC 4.13 3.80 - 5.20 MIL/uL   Hemoglobin 13.1 12.0 - 16.0 g/dL   HCT 37.7 35.0 - 47.0 %   MCV 91.4 80.0 -  100.0 fL   MCH 31.7 26.0 - 34.0 pg   MCHC 34.7 32.0 - 36.0 g/dL   RDW 14.3 11.5 - 14.5 %    Platelets 284 150 - 440 K/uL    Comment: Performed at [facility], [address] [place], [place] [address]  Urine Drug Screen, Qualitative     Status: Abnormal   Collection Time: 03/07/18  3:25 PM  Result Value Ref Range   Tricyclic, Ur Screen POSITIVE (A) NONE DETECTED   Amphetamines, Ur Screen NONE DETECTED NONE DETECTED   MDMA (Ecstasy)Ur Screen NONE DETECTED NONE DETECTED   Cocaine Metabolite,Ur [place] POSITIVE (A) NONE DETECTED   Opiate, Ur Screen NONE DETECTED NONE DETECTED   Phencyclidine (PCP) Ur S NONE DETECTED NONE DETECTED   Cannabinoid 50 Ng, Ur [facility] POSITIVE (A) NONE DETECTED   Barbiturates, Ur Screen NONE DETECTED NONE DETECTED   Benzodiazepine, Ur Scrn NONE DETECTED NONE DETECTED   Methadone Scn, Ur NONE DETECTED NONE DETECTED    Comment: (NOTE) Tricyclics + metabolites, urine    Cutoff 1000 ng/mL Amphetamines + metabolites, urine  Cutoff 1000 ng/mL MDMA (Ecstasy), urine              Cutoff 500 ng/mL Cocaine Metabolite, urine          Cutoff 300 ng/mL Opiate + metabolites, urine        Cutoff 300 ng/mL Phencyclidine (PCP), urine         Cutoff 25 ng/mL Cannabinoid, urine                 Cutoff 50 ng/mL Barbiturates + metabolites, urine  Cutoff 200 ng/mL Benzodiazepine, urine              Cutoff 200 ng/mL Methadone, urine                   Cutoff 300 ng/mL The urine drug screen provides only a preliminary, unconfirmed analytical test result and should not be used for non-medical purposes. Clinical consideration and professional judgment should be applied to any positive drug screen result due to possible interfering substances. A more specific alternate chemical method must be used in order to obtain a confirmed analytical result. Gas chromatography / mass spectrometry (GC/MS) is the preferred confirmat ory method. Performed at [facility], [address] [place], [place] [address]   Pregnancy, urine POC     Status: None   Collection Time:  03/07/18  3:29 PM  Result Value Ref Range   Preg Test, Ur NEGATIVE NEGATIVE    Comment:        THE SENSITIVITY OF THIS METHODOLOGY IS >24 mIU/mL     Current Facility-Administered Medications  Medication Dose Route Frequency Provider Last Rate Last Dose  . folic acid (FOLVITE) tablet 1 mg  1 mg Oral Daily Hinda Kehr, MD   1 mg at 03/08/18 0914  . QUEtiapine (SEROQUEL) tablet 100 mg  100 mg Oral TID Hinda Kehr, MD   100 mg at 03/08/18 0914  . thiamine (VITAMIN B-1) tablet 100 mg  100 mg Oral Daily Hinda Kehr, MD   100 mg at 03/08/18 0177   Current Outpatient Medications  Medication Sig Dispense Refill  . amitriptyline (ELAVIL) 25 MG tablet Take 1 tablet (25 mg total) by mouth at bedtime. 30 tablet 0  . buPROPion (WELLBUTRIN XL) 150 MG 24 hr tablet Take 1 tablet (150 mg total) by mouth daily. 30 tablet 0  . hydrOXYzine (ATARAX/VISTARIL) 25 MG tablet Take 1  tablet (25 mg total) by mouth 3 (three) times daily as needed for anxiety. 30 tablet 0    Musculoskeletal: Strength & Muscle Tone: within normal limits Gait & Station: normal Patient leans: N/A  Psychiatric Specialty Exam: Physical Exam  Nursing note and vitals reviewed. Constitutional: She appears well-developed and well-nourished.  HENT:  Head: Normocephalic and atraumatic.  Eyes: Pupils are equal, round, and reactive to light. Conjunctivae are normal.  Neck: Normal range of motion.  Cardiovascular: Normal heart sounds.  Respiratory: Effort normal.  GI: Soft.  Musculoskeletal: Normal range of motion.  Neurological: She is alert.  Skin: Skin is warm and dry.  Psychiatric: Her mood appears anxious. Her speech is delayed. She is slowed and withdrawn. Cognition and memory are impaired. She expresses impulsivity. She exhibits a depressed mood. She expresses homicidal and suicidal ideation. She expresses suicidal plans. She expresses no homicidal plans.    Review of Systems  Constitutional: Negative.   HENT: Negative.    Eyes: Negative.   Respiratory: Negative.   Cardiovascular: Negative.   Gastrointestinal: Negative.   Musculoskeletal: Negative.   Skin: Negative.   Neurological: Negative.   Psychiatric/Behavioral: Positive for depression, memory loss, substance abuse and suicidal ideas. Negative for hallucinations. The patient is nervous/anxious and has insomnia.     Blood pressure 102/64, pulse 86, temperature (!) 97.5 F (36.4 C), temperature source Oral, resp. rate 16, height 5' 4"  (1.626 m), weight 63.5 kg (140 lb), last menstrual period 02/14/2018, SpO2 99 %.Body mass index is 24.03 kg/m.  General Appearance: Disheveled  Eye Contact:  Minimal  Speech:  Slow  Volume:  Decreased  Mood:  Depressed  Affect:  Congruent and Constricted  Thought Process:  Goal Directed  Orientation:  Full (Time, Place, and Person)  Thought Content:  Logical, Rumination and Tangential  Suicidal Thoughts:  Yes.  with intent/plan  Homicidal Thoughts:  Yes.  without intent/plan  Memory:  Immediate;   Fair Recent;   Fair Remote;   Fair  Judgement:  Fair  Insight:  Fair  Psychomotor Activity:  Decreased  Concentration:  Concentration: Fair  Recall:  [employer] of Knowledge:  Fair  Language:  Fair  Akathisia:  No  Handed:  Right  AIMS (if indicated):     Assets:  Communication Skills Housing Physical Health  ADL's:  Impaired  Cognition:  Impaired,  Mild  Sleep:        Treatment Plan Summary: Plan This is a patient with suicidal ideation depression substance abuse.  Admit to psychiatric ward.  Continue involuntary commitment.  15-minute checks downstairs.  Labs observed.  EKG will be done in full set of metabolic labs completed.  PRN medication for sleep and anxiety treatment team can work on other medicines for depression.  Patient does not appear to need acute detox medication but this can be reassessed downstairs.  Case reviewed with emergency room doctor.  Disposition: Recommend psychiatric Inpatient  admission when medically cleared. Supportive therapy provided about ongoing stressors.  Alethia Berthold, MD 03/08/2018 2:35 PM

## 2018-03-08 NOTE — ED Notes (Addendum)
Pt transported to [facility] via wheelchair escorted by police. In NAD and was given all her personal belongings. Pt signed paper d/c form.

## 2018-03-08 NOTE — ED Notes (Signed)
Pt sleeping. Lunch tray placed on pt bed 

## 2018-03-09 ENCOUNTER — Encounter: Payer: Self-pay | Admitting: Psychiatry

## 2018-03-09 DIAGNOSIS — F332 Major depressive disorder, recurrent severe without psychotic features: Principal | ICD-10-CM

## 2018-03-09 LAB — LIPID PANEL
CHOLESTEROL: 156 mg/dL (ref 0–200)
HDL: 57 mg/dL (ref >40)
LDL Cholesterol: 78 mg/dL (ref 0–99)
TRIGLYCERIDES: 106 mg/dL (ref <150)
Total CHOL/HDL Ratio: 2.7 RATIO
VLDL: 21 mg/dL (ref 0–40)

## 2018-03-09 LAB — HEMOGLOBIN A1C
Hgb A1c MFr Bld: 5.3 % (ref 4.8–5.6)
MEAN PLASMA GLUCOSE: 105.41 mg/dL

## 2018-03-09 LAB — TSH: TSH: 0.774 u[IU]/mL (ref 0.350–4.500)

## 2018-03-09 MED ORDER — IBUPROFEN 600 MG PO TABS
600.0000 mg | ORAL_TABLET | Freq: Four times a day (QID) | ORAL | Status: DC | PRN
  Administered 2018-03-09 – 2018-03-15 (×5): 600 mg via ORAL
  Filled 2018-03-09 (×5): qty 1

## 2018-03-09 MED ORDER — FLUVOXAMINE MALEATE 50 MG PO TABS
50.0000 mg | ORAL_TABLET | Freq: Every day | ORAL | Status: DC
  Administered 2018-03-09: 50 mg via ORAL
  Filled 2018-03-09: qty 1

## 2018-03-09 MED ORDER — CHLORDIAZEPOXIDE HCL 25 MG PO CAPS
25.0000 mg | ORAL_CAPSULE | Freq: Four times a day (QID) | ORAL | Status: DC
  Administered 2018-03-09 (×3): 25 mg via ORAL
  Filled 2018-03-09 (×3): qty 1

## 2018-03-09 MED ORDER — QUETIAPINE FUMARATE 100 MG PO TABS
100.0000 mg | ORAL_TABLET | Freq: Every day | ORAL | Status: DC
  Administered 2018-03-09: 100 mg via ORAL
  Filled 2018-03-09: qty 1

## 2018-03-09 MED ORDER — GABAPENTIN 300 MG PO CAPS
300.0000 mg | ORAL_CAPSULE | Freq: Three times a day (TID) | ORAL | Status: DC
  Administered 2018-03-09 – 2018-03-18 (×27): 300 mg via ORAL
  Filled 2018-03-09 (×26): qty 1

## 2018-03-09 MED ORDER — NICOTINE 21 MG/24HR TD PT24
21.0000 mg | MEDICATED_PATCH | Freq: Every day | TRANSDERMAL | Status: DC
  Administered 2018-03-09 – 2018-03-17 (×9): 21 mg via TRANSDERMAL
  Filled 2018-03-09 (×9): qty 1
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
## 2018-03-09 NOTE — [facility] Suicide Risk Assessment (Signed)
Lori Miller Admission Suicide Risk Assessment   Nursing information obtained from:  Patient Demographic factors:  Caucasian, Low socioeconomic status, Unemployed, Access to firearms Current Mental Status:  Suicidal ideation indicated by patient, Suicide plan, Belief that plan would result in death, Thoughts of violence towards others, Plan to harm others, Intention to act on plan to harm others Loss Factors:  Decline in physical health, Financial problems / change in socioeconomic status Historical Factors:  Family history of mental illness or substance abuse Risk Reduction Factors:  Living with another person, especially a relative  Total Time spent with patient: 1 hour Principal Problem: Severe recurrent major depression without psychotic features (HCC) Diagnosis:   Patient Active Problem List   Diagnosis Date Noted  . Severe recurrent major depression without psychotic features (HCC) [F33.2] 03/08/2018    Priority: High  . Suicidal ideation [R45.851] 03/08/2018  . Self-inflicted injury [Z72.89] 03/08/2018  . Tobacco use disorder [F17.200] 02/27/2017  . Benzodiazepine withdrawal (HCC) [F13.239] 02/27/2017  . Cannabis use disorder, severe, dependence (HCC) [F12.20] 02/27/2017  . Cocaine use disorder, moderate, dependence (HCC) [F14.20] 02/27/2017  . MDD (major depressive disorder), recurrent episode, moderate (HCC) [F33.1] 02/27/2017   Subjective Data: suicidal and homicidal ideation  Continued Clinical Symptoms:  Alcohol Use Disorder Identification Test Final Score (AUDIT): 20 The "Alcohol Use Disorders Identification Test", Guidelines for Use in Primary Care, Second Edition.  World Science writer Lori Miller). Score between 0-7:  no or low risk or alcohol related problems. Score between 8-15:  moderate risk of alcohol related problems. Score between 16-19:  high risk of alcohol related problems. Score 20 or above:  warrants further diagnostic evaluation for alcohol dependence and  treatment.   CLINICAL FACTORS:   Depression:   Comorbid alcohol abuse/dependence Impulsivity Alcohol/Substance Abuse/Dependencies Obsessive-Compulsive Disorder Unstable or Poor Therapeutic Relationship Previous Psychiatric Diagnoses and Treatments   Musculoskeletal: Strength & Muscle Tone: within normal limits Gait & Station: normal Patient leans: N/A  Psychiatric Specialty Exam: Physical Exam  Nursing note and vitals reviewed. Psychiatric: Her speech is normal. Her mood appears anxious. Her affect is blunt. She is withdrawn. Thought content is paranoid. Cognition and memory are normal. She expresses impulsivity. She exhibits a depressed mood. She expresses suicidal ideation. She expresses suicidal plans.    Review of Systems  Neurological: Negative.   Psychiatric/Behavioral: Positive for depression, substance abuse and suicidal ideas.  All other systems reviewed and are negative.   Blood pressure 120/73, pulse 98, temperature 98.3 F (36.8 C), temperature source Oral, resp. rate 18, height 5\' 4"  (1.626 m), weight 63.5 kg (140 lb), last menstrual period 02/14/2018, SpO2 100 %.Body mass index is 24.03 kg/m.  General Appearance: Casual  Eye Contact:  Good  Speech:  Clear and Coherent  Volume:  Normal  Mood:  Anxious and Depressed  Affect:  Flat and Tearful  Thought Process:  Goal Directed and Descriptions of Associations: Intact  Orientation:  Full (Time, Place, and Person)  Thought Content:  WDL  Suicidal Thoughts:  Yes.  with intent/plan  Homicidal Thoughts:  No  Memory:  Immediate;   Fair Recent;   Fair Remote;   Fair  Judgement:  Poor  Insight:  Shallow  Psychomotor Activity:  Decreased  Concentration:  Concentration: Fair and Attention Span: Fair  Recall:  [employer] of Knowledge:  Fair  Language:  Fair  Akathisia:  No  Handed:  Right  AIMS (if indicated):     Assets:  Communication Skills Desire for Improvement Physical Health Resilience  ADL's:  Intact   Cognition:  WNL  Sleep:  Number of Hours: 8.5      COGNITIVE FEATURES THAT CONTRIBUTE TO RISK:  None    SUICIDE RISK:   Moderate:  Frequent suicidal ideation with limited intensity, and duration, some specificity in terms of plans, no associated intent, good self-control, limited dysphoria/symptomatology, some risk factors present, and identifiable protective factors, including available and accessible social support.  PLAN OF CARE: hospital admission, medication management, substance abuse counseling, discharge planning.  Lori Miller is a 37 year old female with a history of depression and cocaine abuse admitted for suicidal and homicidal 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 Claims to have access to a gun.  #Suicidal ideation -patient able to contract for safety in the hospital  #Mood/anxiety -start Luvox 50 mg nightly -start Seroquel 100 mg nightly for mood stabilization -consider minipress but BP low  #Alcohol withdrawal -Librium taper -Neurontin 300 mg TID  # Substance abuse treatment -positive for cocaine and cannabis, claims heavy drinking -desires residential treatment  #Labs -lipid panel, TSH, A1C -EKG -pregnancy test negative  #Admission status  -IVC  #Disposition -homeless  I certify that inpatient services furnished can reasonably be expected to improve the patient's condition.   Lori LineaJolanta Claudia Alvizo, MD 03/09/2018, 11:42 AM

## 2018-03-09 NOTE — Progress Notes (Signed)
Recreation Therapy Notes  Date: 03/09/2018  Time: 1:00pm   Location: Craft Room   Behavioral response: N/A   Intervention Topic: Problem Solving  Discussion/Intervention: Patient did not attend group.   Clinical Observations/Feedback:  Patient did not attend group.   Lori Miller LRT/CTRS        Lori Miller 03/09/2018 2:41 PM

## 2018-03-09 NOTE — [facility] Group Notes (Signed)
03/09/2018 1PM  Type of Therapy/Topic:  Group Therapy:  Feelings about Diagnosis  Participation Level:  Did Not Attend   Description of Group:   This group will allow patients to explore their thoughts and feelings about diagnoses they have received. Patients will be guided to explore their level of understanding and acceptance of these diagnoses. Facilitator will encourage patients to process their thoughts and feelings about the reactions of others to their diagnosis and will guide patients in identifying ways to discuss their diagnosis with significant others in their lives. This group will be process-oriented, with patients participating in exploration of their own experiences, giving and receiving support, and processing challenge from other group members.   Therapeutic Goals: 1. Patient will demonstrate understanding of diagnosis as evidenced by identifying two or more symptoms of the disorder 2. Patient will be able to express two feelings regarding the diagnosis 3. Patient will demonstrate their ability to communicate their needs through discussion and/or role play  Summary of Patient Progress: Patient was encouraged and invited to attend group. Patient did not attend group. Social worker will continue to encourage group participation in the future.       Therapeutic Modalities:   Cognitive Behavioral Therapy Brief Therapy Feelings Identification    Johny ShearsCassandra  Sameerah Nachtigal, Alexander MtLCSW 03/09/2018 4:34 PM

## 2018-03-09 NOTE — Plan of Care (Addendum)
Patient is alert and oriented with passive SI and HI thoughts towards drug dealer.  Patient denies AVH.  Patient rates depression 10/10 and hoplessnes 10/10 and anxiety 10/10. Patient complains of having tremors; diarrhea, cravings, runny nose, cramping, nausea, chilling and sweating during the night. Patient also complains of head back and stomach aches. Patient complains of being agitated and irritable this morning . Patient skin is very irritated due to picking at skin.  Patient is logical and coherent; eye contact is brief. Patient is in scrubs. Patient is able to verbalize feelings and attending groups .Patient appears anxious and restless with a flat affect. Patient states she did not sleep well last night. Patient did eat breakfast and is showing no interest in activities.  Nurse will continue to monitor. Problem: Education: Goal: Knowledge of General Education information will improve Description Including pain rating scale, medication(s)/side effects and non-pharmacologic comfort measures Outcome: Not Progressing   Problem: Education: Goal: Knowledge of [place] [facility] Education information/materials will improve Outcome: Not Progressing Goal: Emotional status will improve Outcome: Not Progressing Goal: Mental status will improve Outcome: Not Progressing Goal: Verbalization of understanding the information provided will improve Outcome: Not Progressing   Problem: Activity: Goal: Interest or engagement in activities will improve Outcome: Not Progressing Goal: Sleeping patterns will improve Outcome: Not Progressing

## 2018-03-10 DIAGNOSIS — F1020 Alcohol dependence, uncomplicated: Secondary | ICD-10-CM | POA: Diagnosis present

## 2018-03-10 DIAGNOSIS — F429 Obsessive-compulsive disorder, unspecified: Secondary | ICD-10-CM | POA: Diagnosis present

## 2018-03-10 DIAGNOSIS — F4310 Post-traumatic stress disorder, unspecified: Secondary | ICD-10-CM | POA: Diagnosis present

## 2018-03-10 MED ORDER — CHLORDIAZEPOXIDE HCL 10 MG PO CAPS
10.0000 mg | ORAL_CAPSULE | Freq: Four times a day (QID) | ORAL | Status: AC
  Administered 2018-03-10 – 2018-03-12 (×8): 10 mg via ORAL
  Filled 2018-03-10 (×7): qty 1

## 2018-03-10 MED ORDER — QUETIAPINE FUMARATE 200 MG PO TABS
200.0000 mg | ORAL_TABLET | Freq: Every day | ORAL | Status: DC
  Administered 2018-03-10 – 2018-03-14 (×5): 200 mg via ORAL
  Filled 2018-03-10 (×5): qty 1

## 2018-03-10 MED ORDER — FLUVOXAMINE MALEATE 50 MG PO TABS
100.0000 mg | ORAL_TABLET | Freq: Every day | ORAL | Status: DC
  Administered 2018-03-10 – 2018-03-17 (×8): 100 mg via ORAL
  Filled 2018-03-10 (×8): qty 2

## 2018-03-10 NOTE — Plan of Care (Signed)
Patient is calm and responding well to treatment regimen, aware of her coping skills , assertive and participating in scheduled activities with peers, patient voice no concerns at this time, safety is maintained, denies any SI/HI and no signs of AVH 15 minute rounding is in progress. Patient is sleeping lon hours with out any  disturbances. Problem: Education: Goal: Knowledge of General Education information will improve Description Including pain rating scale, medication(s)/side effects and non-pharmacologic comfort measures Outcome: Progressing   Problem: Education: Goal: Knowledge of [place] [facility] Education information/materials will improve Outcome: Progressing Goal: Emotional status will improve Outcome: Progressing Goal: Mental status will improve Outcome: Progressing Goal: Verbalization of understanding the information provided will improve Outcome: Progressing   Problem: Activity: Goal: Interest or engagement in activities will improve Outcome: Progressing Goal: Sleeping patterns will improve Outcome: Progressing   Problem: Coping: Goal: Ability to verbalize frustrations and anger appropriately will improve Outcome: Progressing Goal: Ability to demonstrate self-control will improve Outcome: Progressing   Problem: Health Behavior/Discharge Planning: Goal: Identification of resources available to assist in meeting health care needs will improve Outcome: Progressing Goal: Compliance with treatment plan for underlying cause of condition will improve Outcome: Progressing   Problem: Physical Regulation: Goal: Ability to maintain clinical measurements within normal limits will improve Outcome: Progressing   Problem: Safety: Goal: Periods of time without injury will increase Outcome: Progressing   Problem: Medication: Goal: Compliance with prescribed medication regimen will improve Outcome: Progressing   Problem: Self-Concept: Goal: Ability to disclose and  discuss suicidal ideas will improve Outcome: Progressing Goal: Will verbalize positive feelings about self Outcome: Progressing   Problem: Education: Goal: Understanding of discharge needs will improve Outcome: Progressing   Problem: Physical Regulation: Goal: Complications related to the disease process, condition or treatment will be avoided or minimized Outcome: Progressing

## 2018-03-10 NOTE — Progress Notes (Signed)
Today patient rates her sleep as poor, appetite in the 24 hours as good; energy level as normal; concentration for today poor. Patient complains of  " I can't quit thinking about taking a hit of crack or a drink of alcohol." Patient more interactive with other peers today than yesterday and this morning.

## 2018-03-10 NOTE — Progress Notes (Signed)
Recreation Therapy Notes   Date: 03/10/2018  Time: 9:30 pm   Location: Craft [facility]   Behavioral response: N/A   Intervention Topic: Stress  Discussion/Intervention: Patient did not attend group.   Clinical Observations/Feedback:  Patient did not attend group.   Tyreesha Maharaj LRT/CTRS        Latitia Housewright 03/10/2018 12:09 PM

## 2018-03-10 NOTE — Progress Notes (Addendum)
[facility] MD Progress Note  03/10/2018 7:44 PM Lori Miller  MRN:  [id number]  Subjective:    Lori Miller met with treatment team today. She reports anxiety and depression but no longer suicidal thoughts. There are no symptoms of alcohol withdrawal. VS are stable. Tolerates medications well.   Principal Problem: Severe recurrent major depression without psychotic features ([place]) Diagnosis:   Patient Active Problem List   Diagnosis Date Noted  . Severe recurrent major depression without psychotic features ([place]) [F33.2] 03/08/2018    Priority: High  . Alcohol use disorder, moderate, dependence ([place]) [F10.20] 03/10/2018  . OCD (obsessive compulsive disorder) [F42.9] 03/10/2018  . PTSD (post-traumatic stress disorder) [F43.10] 03/10/2018  . Suicidal ideation [R45.851] 03/08/2018  . Self-inflicted injury [Q59.56] 03/08/2018  . Tobacco use disorder [F17.200] 02/27/2017  . Benzodiazepine withdrawal ([place]) [F13.239] 02/27/2017  . Cannabis use disorder, severe, dependence ([place]) [F12.20] 02/27/2017  . Cocaine use disorder, moderate, dependence ([place]) [F14.20] 02/27/2017  . MDD (major depressive disorder), recurrent episode, moderate ([place]) [F33.1] 02/27/2017   Total Time spent with patient: 20 minutes  Past Psychiatric History: depression, cocaine use  Past Medical History:  Past Medical History:  Diagnosis Date  . Anxiety   . Anxiety   . Depression   . Depression   . Migraine   . PTSD (post-traumatic stress disorder)     Past Surgical History:  Procedure Laterality Date  . BUNIONECTOMY    . CYST EXCISION     Family History: History reviewed. No pertinent family history. Family Psychiatric  History: none Social History:  Social History   Substance and Sexual Activity  Alcohol Use Yes  . Alcohol/week: 2.4 oz  . Types: 4 Shots of liquor per week     Social History   Substance and Sexual Activity  Drug Use Yes  . Frequency: 6.0 times per week  . Types: Cocaine, Marijuana,  Benzodiazepines   Comment: marijuana daily; crack daily x 3 months    Social History   Socioeconomic History  . Marital status: Single    Spouse name: Not on file  . Number of children: Not on file  . Years of education: Not on file  . Highest education level: Not on file  Occupational History  . Not on file  Social Needs  . Financial resource strain: Not on file  . Food insecurity:    Worry: Not on file    Inability: Not on file  . Transportation needs:    Medical: Not on file    Non-medical: Not on file  Tobacco Use  . Smoking status: Current Every Day Smoker    Packs/day: 1.00    Types: Cigarettes  . Smokeless tobacco: Never Used  Substance and Sexual Activity  . Alcohol use: Yes    Alcohol/week: 2.4 oz    Types: 4 Shots of liquor per week  . Drug use: Yes    Frequency: 6.0 times per week    Types: Cocaine, Marijuana, Benzodiazepines    Comment: marijuana daily; crack daily x 3 months  . Sexual activity: Yes  Lifestyle  . Physical activity:    Days per week: Not on file    Minutes per session: Not on file  . Stress: Not on file  Relationships  . Social connections:    Talks on phone: Not on file    Gets together: Not on file    Attends religious service: Not on file    Active member of club or organization: Not on file  Attends meetings of clubs or organizations: Not on file    Relationship status: Not on file  Other Topics Concern  . Not on file  Social History Narrative  . Not on file   Additional Social History:    Pain Medications: See PTA Prescriptions: See PTA Over the Counter: See PTA History of alcohol / drug use?: Yes Name of Substance 1: crack cocaine 1 - Age of First Use: 18 1 - Amount (size/oz): $220 1 - Frequency: daily x 3 months 1 - Duration: a year 1 - Last Use / Amount: 03/06/18 Name of Substance 2: ETOH 2 - Age of First Use: unknown 2 - Amount (size/oz): 3-6 drinks 2 - Frequency: daily 2 - Duration: 3 months 2 - Last Use /  Amount: 03/06/18 Name of Substance 3: Marijuana 3 - Age of First Use: unknown 3 - Amount (size/oz): unknown 3 - Frequency: daily 3 - Duration: unknown 3 - Last Use / Amount: 03/06/18              Sleep: Fair  Appetite:  Fair  Current Medications: Current Facility-Administered Medications  Medication Dose Route Frequency Provider Last Rate Last Dose  . acetaminophen (TYLENOL) tablet 650 mg  650 mg Oral Q6H PRN Clapacs, John T, MD      . alum & mag hydroxide-simeth (MAALOX/MYLANTA) 200-200-20 MG/5ML suspension 30 mL  30 mL Oral Q4H PRN Clapacs, John T, MD      . chlordiazePOXIDE (LIBRIUM) capsule 10 mg  10 mg Oral QID Tripp Goins B, MD   10 mg at 03/10/18 1659  . fluvoxaMINE (LUVOX) tablet 100 mg  100 mg Oral QHS Maleigha Colvard B, MD      . gabapentin (NEURONTIN) capsule 300 mg  300 mg Oral TID Kristiane Morsch B, MD   300 mg at 03/10/18 1659  . hydrOXYzine (ATARAX/VISTARIL) tablet 50 mg  50 mg Oral TID PRN Clapacs, Madie Reno, MD   50 mg at 03/10/18 1658  . ibuprofen (ADVIL,MOTRIN) tablet 600 mg  600 mg Oral Q6H PRN Porschia Willbanks B, MD   600 mg at 03/09/18 1308  . magnesium hydroxide (MILK OF MAGNESIA) suspension 30 mL  30 mL Oral Daily PRN Clapacs, John T, MD      . nicotine (NICODERM CQ - dosed in mg/24 hours) patch 21 mg  21 mg Transdermal Daily Jude Naclerio B, MD   21 mg at 03/10/18 0850  . QUEtiapine (SEROQUEL) tablet 200 mg  200 mg Oral QHS Cara Aguino B, MD        Lab Results:  Results for orders placed or performed during the hospital encounter of 03/08/18 (from the past 48 hour(s))  Hemoglobin A1c     Status: None   Collection Time: 03/09/18  6:32 AM  Result Value Ref Range   Hgb A1c MFr Bld 5.3 4.8 - 5.6 %    Comment: (NOTE) Pre diabetes:          5.7%-6.4% Diabetes:              >6.4% Glycemic control for   <7.0% adults with diabetes    Mean Plasma Glucose 105.41 mg/dL    Comment: Performed at [facility], [place] [address] [place], [place] [address]  Lipid panel     Status: None   Collection Time: 03/09/18  6:32 AM  Result Value Ref Range   Cholesterol 156 0 - 200 mg/dL   Triglycerides 106 <150 mg/dL   HDL 57 >40 mg/dL  Total CHOL/HDL Ratio 2.7 RATIO   VLDL 21 0 - 40 mg/dL   LDL Cholesterol 78 0 - 99 mg/dL    Comment:        Total Cholesterol/HDL:CHD Risk Coronary Heart Disease Risk Table                     Men   Women  1/2 Average Risk   3.4   3.3  Average Risk       5.0   4.4  2 X Average Risk   9.6   7.1  3 X Average Risk  23.4   11.0        Use the calculated Patient Ratio above and the CHD Risk Table to determine the patient's CHD Risk.        ATP III CLASSIFICATION (LDL):  <100     mg/dL   Optimal  100-129  mg/dL   Near or Above                    Optimal  130-159  mg/dL   Borderline  160-189  mg/dL   High  >190     mg/dL   Very High Performed at [facility], [place], [place] [address]   TSH     Status: None   Collection Time: 03/09/18  6:32 AM  Result Value Ref Range   TSH 0.774 0.350 - 4.500 uIU/mL    Comment: Performed by a 3rd Generation assay with a functional sensitivity of <=0.01 uIU/mL. Performed at [facility], [address] [place], [place] [address]     Blood Alcohol level:  Lab Results  Component Value Date   [facility] <10 03/07/2018   ETH <10 93/79/0240    Metabolic Disorder Labs: Lab Results  Component Value Date   HGBA1C 5.3 03/09/2018   MPG 105.41 03/09/2018   MPG 111 02/27/2017   No results found for: PROLACTIN Lab Results  Component Value Date   CHOL 156 03/09/2018   TRIG 106 03/09/2018   HDL 57 03/09/2018   CHOLHDL 2.7 03/09/2018   VLDL 21 03/09/2018   LDLCALC 78 03/09/2018   LDLCALC 100 (H) 02/27/2017    Physical Findings: AIMS:  , ,  ,  ,    CIWA:  CIWA-Ar Total: 6 COWS:     Musculoskeletal: Strength & Muscle Tone: within normal limits Gait & Station: normal Patient leans: N/A  Psychiatric  Specialty Exam: Physical Exam  Nursing note and vitals reviewed. Psychiatric: Her speech is normal. Her mood appears anxious. Her affect is blunt. She is actively hallucinating. Thought content is paranoid. Cognition and memory are normal. She expresses impulsivity. She exhibits a depressed mood. She expresses suicidal ideation.    Review of Systems  Neurological: Positive for tremors.  Psychiatric/Behavioral: Positive for depression, hallucinations, substance abuse and suicidal ideas.  All other systems reviewed and are negative.   Blood pressure (!) 89/62, pulse (!) 106, temperature 98 F (36.7 C), temperature source Oral, resp. rate 18, height 5' 4"  (1.626 m), weight 63.5 kg (140 lb), last menstrual period 02/14/2018, SpO2 98 %.Body mass index is 24.03 kg/m.  General Appearance: Casual  Eye Contact:  Good  Speech:  Clear and Coherent  Volume:  Normal  Mood:  Anxious and Depressed  Affect:  Flat  Thought Process:  Goal Directed and Descriptions of Associations: Intact  Orientation:  Full (Time, Place, and Person)  Thought Content:  Hallucinations: Visual and Paranoid Ideation  Suicidal Thoughts:  Yes.  without intent/plan  Homicidal Thoughts:  No  Memory:  Immediate;   Fair Recent;   Fair Remote;   Fair  Judgement:  Poor  Insight:  Lacking  Psychomotor Activity:  Normal  Concentration:  Concentration: Fair and Attention Span: Fair  Recall:  [employer] of Knowledge:  Fair  Language:  Fair  Akathisia:  No  Handed:  Right  AIMS (if indicated):     Assets:  Communication Skills Desire for Improvement Physical Health Resilience  ADL's:  Intact  Cognition:  WNL  Sleep:  Number of Hours: 6     Treatment Plan Summary: Daily contact with patient to assess and evaluate symptoms and progress in treatment and Medication management   Ms. 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 Claims to have access to a gun.  #Suicidal ideation, resolved -patient able to contract for safety in the hospital  #Mood/anxiety -increase Luvox to 100 mg nightly -increase Seroquel to 200 mg nightly for mood stabilization -consider minipress but BP low  #Alcohol withdrawal -lower Librium to 10 mg QID  -continue Neurontin 300 mg TID  # Substance abuse treatment -positive for cocaine and cannabis, claims heavy drinking -desires residential treatment but no longer long term  #Migraine headaches -Motrin has been helpful at home  #Smoking cessation -nicotine patch is available  #Labs -lipid panel, TSH, A1C are normal -EKG reviewed, NSR with QTc 433 -pregnancy test negative  #Admission status  -IVC  #Disposition -homeless      Orson Slick, MD 03/10/2018, 7:44 PM

## 2018-03-10 NOTE — Plan of Care (Signed)
Patient is alert and oriented. Today patient denies Suicidal ideation, and denies Homicidal ideation against drug dealer. Patient complains of anxiety, vistaril given and occasional body aches. Patient is not attending groups, not interested in group activities. Patient comes out of room to talk on the telephone and eat meals. Patient also complains of insomnia at night. Affect is anxious and worrying. Patient last BM was 03/09/18. Nurse will continue to monitor. Problem: Education: Goal: Knowledge of General Education information will improve Description Including pain rating scale, medication(s)/side effects and non-pharmacologic comfort measures Outcome: Not Progressing   Problem: Education: Goal: Knowledge of [place] General Education information/materials will improve Outcome: Not Progressing Goal: Emotional status will improve Outcome: Not Progressing Goal: Mental status will improve Outcome: Progressing Goal: Verbalization of understanding the information provided will improve Outcome: Progressing   Problem: Activity: Goal: Interest or engagement in activities will improve Outcome: Not Progressing Goal: Sleeping patterns will improve Outcome: Not Progressing

## 2018-03-10 NOTE — Progress Notes (Signed)
Recreation Therapy Notes  INPATIENT RECREATION THERAPY ASSESSMENT  Patient Details Name: Lori Miller MRN: [id number] DOB: [birth year] Today's Date: 03/10/2018       Information Obtained From: Patient  Able to Participate in Assessment/Interview: Yes  Patient Presentation: Responsive  Reason for Admission (Per Patient): Suicidal Ideation, Substance Abuse, Other (Comments)(Depression)  Patient Stressors: Family, Friends, Work  Coping Skills:   Sports, Exercise, Substance Abuse  Leisure Interests (2+):  Music - Listen, Art - Coloring  Frequency of Recreation/Participation: Monthly  Awareness of Community Resources:  Yes  Community Resources:  Park  Current Use: No  If no, Barriers?: Other (Comment)(Drugs)  Expressed Interest in [employer] Corporation[employer] Resource Information:    [place]  Patient Main Form of Transportation: Walk  Patient Strengths:  Big heart, care about others  Patient Identified Areas of Improvement:  Being sober  Patient Goal for Hospitalization:  Cope with withdrawals  Current SI (including self-harm):  No  Current HI:  No  Current AVH: Yes(I sometime hear people behind me)  Staff Intervention Plan: Group Attendance, Collaborate with Interdisciplinary Treatment Team  Consent to Intern Participation: N/A  Julizza Sassone 03/10/2018, 4:21 PM

## 2018-03-10 NOTE — Tx Team (Addendum)
Interdisciplinary Treatment and Diagnostic Plan Update  03/10/2018 Time of Session: 11:00 BARBEE MAMULA MRN: [id number]  Principal Diagnosis: Severe recurrent major depression without psychotic features [facility])  Secondary Diagnoses: Principal Problem:   Severe recurrent major depression without psychotic features (HCC) Active Problems:   Tobacco use disorder   Cannabis use disorder, severe, dependence (HCC)   Cocaine use disorder, moderate, dependence (HCC)   Suicidal ideation   Self-inflicted injury   Alcohol use disorder, moderate, dependence (HCC)   OCD (obsessive compulsive disorder)   PTSD (post-traumatic stress disorder)   Current Medications:  Current Facility-Administered Medications  Medication Dose Route Frequency Provider Last Rate Last Dose  . acetaminophen (TYLENOL) tablet 650 mg  650 mg Oral Q6H PRN Clapacs, John T, MD      . alum & mag hydroxide-simeth (MAALOX/MYLANTA) 200-200-20 MG/5ML suspension 30 mL  30 mL Oral Q4H PRN Clapacs, John T, MD      . chlordiazePOXIDE (LIBRIUM) capsule 10 mg  10 mg Oral QID Pucilowska, Jolanta B, MD      . fluvoxaMINE (LUVOX) tablet 100 mg  100 mg Oral QHS Pucilowska, Jolanta B, MD      . gabapentin (NEURONTIN) capsule 300 mg  300 mg Oral TID Pucilowska, Jolanta B, MD   300 mg at 03/10/18 0850  . hydrOXYzine (ATARAX/VISTARIL) tablet 50 mg  50 mg Oral TID PRN Clapacs, Jackquline Denmark, MD   50 mg at 03/10/18 0850  . ibuprofen (ADVIL,MOTRIN) tablet 600 mg  600 mg Oral Q6H PRN Pucilowska, Jolanta B, MD   600 mg at 03/09/18 1308  . magnesium hydroxide (MILK OF MAGNESIA) suspension 30 mL  30 mL Oral Daily PRN Clapacs, John T, MD      . nicotine (NICODERM CQ - dosed in mg/24 hours) patch 21 mg  21 mg Transdermal Daily Pucilowska, Jolanta B, MD   21 mg at 03/10/18 0850  . QUEtiapine (SEROQUEL) tablet 200 mg  200 mg Oral QHS Pucilowska, Jolanta B, MD       PTA Medications: Medications Prior to Admission  Medication Sig Dispense Refill Last Dose  .  amitriptyline (ELAVIL) 25 MG tablet Take 1 tablet (25 mg total) by mouth at bedtime. (Patient not taking: Reported on 03/08/2018) 30 tablet 0 Not Taking at Unknown time  . buPROPion (WELLBUTRIN XL) 150 MG 24 hr tablet Take 1 tablet (150 mg total) by mouth daily. (Patient not taking: Reported on 03/08/2018) 30 tablet 0 Not Taking at Unknown time  . hydrOXYzine (ATARAX/VISTARIL) 25 MG tablet Take 1 tablet (25 mg total) by mouth 3 (three) times daily as needed for anxiety. (Patient not taking: Reported on 03/08/2018) 30 tablet 0 Not Taking at Unknown time    Patient Stressors: Financial difficulties Marital or family conflict Medication change or noncompliance Occupational concerns Substance abuse  Patient Strengths: Average or above average intelligence Capable of independent living Manufacturing systems engineer Motivation for treatment/growth  Treatment Modalities: Medication Management, Group therapy, Case management,  1 to 1 session with clinician, Psychoeducation, Recreational therapy.   Physician Treatment Plan for Primary Diagnosis: Severe recurrent major depression without psychotic features (HCC) Long Term Goal(s): Improvement in symptoms so as ready for discharge Improvement in symptoms so as ready for discharge   Short Term Goals: Ability to identify changes in lifestyle to reduce recurrence of condition will improve Ability to verbalize feelings will improve Ability to disclose and discuss suicidal ideas Ability to demonstrate self-control will improve Ability to identify and develop effective coping behaviors will improve Ability to maintain  clinical measurements within normal limits will improve Compliance with prescribed medications will improve Ability to identify triggers associated with substance abuse/mental health issues will improve Ability to identify changes in lifestyle to reduce recurrence of condition will improve Ability to demonstrate self-control will improve Ability  to identify triggers associated with substance abuse/mental health issues will improve  Medication Management: Evaluate patient's response, side effects, and tolerance of medication regimen.  Therapeutic Interventions: 1 to 1 sessions, Unit Group sessions and Medication administration.  Evaluation of Outcomes: Progressing  Physician Treatment Plan for Secondary Diagnosis: Principal Problem:   Severe recurrent major depression without psychotic features (HCC) Active Problems:   Tobacco use disorder   Cannabis use disorder, severe, dependence (HCC)   Cocaine use disorder, moderate, dependence (HCC)   Suicidal ideation   Self-inflicted injury   Alcohol use disorder, moderate, dependence (HCC)   OCD (obsessive compulsive disorder)   PTSD (post-traumatic stress disorder)  Long Term Goal(s): Improvement in symptoms so as ready for discharge Improvement in symptoms so as ready for discharge   Short Term Goals: Ability to identify changes in lifestyle to reduce recurrence of condition will improve Ability to verbalize feelings will improve Ability to disclose and discuss suicidal ideas Ability to demonstrate self-control will improve Ability to identify and develop effective coping behaviors will improve Ability to maintain clinical measurements within normal limits will improve Compliance with prescribed medications will improve Ability to identify triggers associated with substance abuse/mental health issues will improve Ability to identify changes in lifestyle to reduce recurrence of condition will improve Ability to demonstrate self-control will improve Ability to identify triggers associated with substance abuse/mental health issues will improve     Medication Management: Evaluate patient's response, side effects, and tolerance of medication regimen.  Therapeutic Interventions: 1 to 1 sessions, Unit Group sessions and Medication administration.  Evaluation of Outcomes:  Progressing   RN Treatment Plan for Primary Diagnosis: Severe recurrent major depression without psychotic features (HCC) Long Term Goal(s): Knowledge of disease and therapeutic regimen to maintain health will improve  Short Term Goals: Ability to identify and develop effective coping behaviors will improve and Compliance with prescribed medications will improve  Medication Management: RN will administer medications as ordered by provider, will assess and evaluate patient's response and provide education to patient for prescribed medication. RN will report any adverse and/or side effects to prescribing provider.  Therapeutic Interventions: 1 on 1 counseling sessions, Psychoeducation, Medication administration, Evaluate responses to treatment, Monitor vital signs and CBGs as ordered, Perform/monitor CIWA, COWS, AIMS and Fall Risk screenings as ordered, Perform wound care treatments as ordered.  Evaluation of Outcomes: Progressing   LCSW Treatment Plan for Primary Diagnosis: Severe recurrent major depression without psychotic features (HCC) Long Term Goal(s): Safe transition to appropriate next level of care at discharge, Engage patient in therapeutic group addressing interpersonal concerns.  Short Term Goals: Engage patient in aftercare planning with referrals and resources, Identify triggers associated with mental health/substance abuse issues and Increase skills for wellness and recovery  Therapeutic Interventions: Assess for all discharge needs, 1 to 1 time with Social worker, Explore available resources and support systems, Assess for adequacy in community support network, Educate family and significant other(s) on suicide prevention, Complete Psychosocial Assessment, Interpersonal group therapy.  Evaluation of Outcomes: Progressing   Progress in Treatment: Attending groups: Yes. Participating in groups: Yes. Taking medication as prescribed: Yes. Toleration medication:  Yes. Family/Significant other contact made: No, will contact:    Patient understands diagnosis: Yes. Discussing patient identified problems/goals  with staff: Yes. Medical problems stabilized or resolved: Yes. Denies suicidal/homicidal ideation: Yes. Issues/concerns per patient self-inventory: No. Other:    New problem(s) identified: No, Describe:     New Short Term/Long Term Goal(s):  Patient Goals:  To get clean  Discharge Plan or Barriers: [facility] referral made 7/31.  Reason for Continuation of Hospitalization: Depression Medication stabilization Withdrawal symptoms  Estimated Length of Stay: 5-7 days  Recreational Therapy: Patient Stressors: Family, Friends, Work Patient Goal: Patient will identify 3 resources in their community to aid in recovery within 5 recreation therapy group sessions  Attendees: Patient:Teran Rhyner 03/10/2018 3:19 PM  Physician: Kristine Linea, MD 03/10/2018 3:19 PM  Nursing: Hulan Amato, RN 03/10/2018 3:19 PM  RN Care Manager: 03/10/2018 3:19 PM  Social Worker: Jake Shark, LCSW 03/10/2018 3:19 PM  Recreational Therapist: Garret Reddish, LRT 03/10/2018 3:19 PM  Other: Johny Shears, LCSWA 03/10/2018 3:19 PM  Other: Damian Leavell, Chaplain 03/10/2018 3:19 PM  Other: 03/10/2018 3:19 PM    Scribe for Treatment Team: Glennon Mac, LCSW 03/10/2018 3:19 PM

## 2018-03-10 NOTE — Progress Notes (Addendum)
[facility]  03/11/2018 2:57 PM Lori Miller  MRN:  [id number]  Subjective:    Lori Miller has been improving but today afternoon she is increasingly agitated. We have two other patients on the unit who are loud, disruptive and intrusive in the milieu. Otherwise, she has been actively participating in programming while awaiting transfer to [facility] rehab facility.  Principal Problem: Severe recurrent major depression without psychotic features (HCC) Diagnosis:   Patient Active Problem List   Diagnosis Date Noted  . Severe recurrent major depression without psychotic features (HCC) [F33.2] 03/08/2018    Priority: High  . Alcohol use disorder, moderate, dependence (HCC) [F10.20] 03/10/2018  . OCD (obsessive compulsive disorder) [F42.9] 03/10/2018  . PTSD (post-traumatic stress disorder) [F43.10] 03/10/2018  . Suicidal ideation [R45.851] 03/08/2018  . Self-inflicted injury [Z72.89] 03/08/2018  . Tobacco use disorder [F17.200] 02/27/2017  . Benzodiazepine withdrawal (HCC) [F13.239] 02/27/2017  . Cannabis use disorder, severe, dependence (HCC) [F12.20] 02/27/2017  . Cocaine use disorder, moderate, dependence (HCC) [F14.20] 02/27/2017  . MDD (major depressive disorder), recurrent episode, moderate (HCC) [F33.1] 02/27/2017   Total Time spent with patient: 20 minutes  Past Psychiatric History: depression, substance abuse  Past Medical History:  Past Medical History:  Diagnosis Date  . Anxiety   . Anxiety   . Depression   . Depression   . Migraine   . PTSD (post-traumatic stress disorder)     Past Surgical History:  Procedure Laterality Date  . BUNIONECTOMY    . CYST EXCISION     Family History: History reviewed. No pertinent family history. Family Psychiatric  History: none Social History:  Social History   Substance and Sexual Activity  Alcohol Use Yes  . Alcohol/week: 2.4 oz  . Types: 4 Shots of liquor per week     Social History   Substance and Sexual  Activity  Drug Use Yes  . Frequency: 6.0 times per week  . Types: Cocaine, Marijuana, Benzodiazepines   Comment: marijuana daily; crack daily x 3 months    Social History   Socioeconomic History  . Marital status: Single    Spouse name: Not on file  . Number of children: Not on file  . Years of education: Not on file  . Highest education level: Not on file  Occupational History  . Not on file  Social Needs  . Financial resource strain: Not on file  . Food insecurity:    Worry: Not on file    Inability: Not on file  . Transportation needs:    Medical: Not on file    Non-medical: Not on file  Tobacco Use  . Smoking status: Current Every Day Smoker    Packs/day: 1.00    Types: Cigarettes  . Smokeless tobacco: Never Used  Substance and Sexual Activity  . Alcohol use: Yes    Alcohol/week: 2.4 oz    Types: 4 Shots of liquor per week  . Drug use: Yes    Frequency: 6.0 times per week    Types: Cocaine, Marijuana, Benzodiazepines    Comment: marijuana daily; crack daily x 3 months  . Sexual activity: Yes  Lifestyle  . Physical activity:    Days per week: Not on file    Minutes per session: Not on file  . Stress: Not on file  Relationships  . Social connections:    Talks on phone: Not on file    Gets together: Not on file    Attends religious service: Not on file  Active member of club or organization: Not on file    Attends meetings of clubs or organizations: Not on file    Relationship status: Not on file  Other Topics Concern  . Not on file  Social History Narrative  . Not on file   Additional Social History:    Pain Medications: See PTA Prescriptions: See PTA Over the Counter: See PTA History of alcohol / drug use?: Yes Name of Substance 1: crack cocaine 1 - Age of First Use: 18 1 - Amount (size/oz): $220 1 - Frequency: daily x 3 months 1 - Duration: a year 1 - Last Use / Amount: 03/06/18 Name of Substance 2: ETOH 2 - Age of First Use: unknown 2 -  Amount (size/oz): 3-6 drinks 2 - Frequency: daily 2 - Duration: 3 months 2 - Last Use / Amount: 03/06/18 Name of Substance 3: Marijuana 3 - Age of First Use: unknown 3 - Amount (size/oz): unknown 3 - Frequency: daily 3 - Duration: unknown 3 - Last Use / Amount: 03/06/18              Sleep: Fair  Appetite:  Fair  Current Medications: Current Facility-Administered Medications  Medication Dose Route Frequency Provider Last Rate Last Dose  . acetaminophen (TYLENOL) tablet 650 mg  650 mg Oral Q6H PRN Clapacs, John T, MD      . alum & mag hydroxide-simeth (MAALOX/MYLANTA) 200-200-20 MG/5ML suspension 30 mL  30 mL Oral Q4H PRN Clapacs, John T, MD      . chlordiazePOXIDE (LIBRIUM) capsule 10 mg  10 mg Oral QID Jaskirat Zertuche B, MD   10 mg at 03/11/18 1103  . chlorproMAZINE (THORAZINE) tablet 25 mg  25 mg Oral QID PRN Deandrew Hoecker B, MD      . fluvoxaMINE (LUVOX) tablet 100 mg  100 mg Oral QHS Tira Lafferty B, MD   100 mg at 03/10/18 2102  . gabapentin (NEURONTIN) capsule 300 mg  300 mg Oral TID Blane Worthington B, MD   300 mg at 03/11/18 1103  . hydrOXYzine (ATARAX/VISTARIL) tablet 50 mg  50 mg Oral TID PRN Clapacs, Jackquline Denmark, MD   50 mg at 03/11/18 1103  . ibuprofen (ADVIL,MOTRIN) tablet 600 mg  600 mg Oral Q6H PRN Domonic Hiscox B, MD   600 mg at 03/09/18 1308  . magnesium hydroxide (MILK OF MAGNESIA) suspension 30 mL  30 mL Oral Daily PRN Clapacs, John T, MD      . nicotine (NICODERM CQ - dosed in mg/24 hours) patch 21 mg  21 mg Transdermal Daily Lota Leamer B, MD   21 mg at 03/11/18 0825  . QUEtiapine (SEROQUEL) tablet 200 mg  200 mg Oral QHS Alyssamae Klinck B, MD   200 mg at 03/10/18 2102    Lab Results:  No results found for this or any previous visit (from the past 48 hour(s)).  Blood Alcohol level:  Lab Results  Component Value Date   ETH <10 03/07/2018   ETH <10 06/29/2017    Metabolic Disorder Labs: Lab Results  Component Value Date    HGBA1C 5.3 03/09/2018   MPG 105.41 03/09/2018   MPG 111 02/27/2017   No results found for: PROLACTIN Lab Results  Component Value Date   CHOL 156 03/09/2018   TRIG 106 03/09/2018   HDL 57 03/09/2018   CHOLHDL 2.7 03/09/2018   VLDL 21 03/09/2018   LDLCALC 78 03/09/2018   LDLCALC 100 (H) 02/27/2017    Physical Findings: AIMS:  , ,  ,  ,  CIWA:  CIWA-Ar Total: 6 COWS:     Musculoskeletal: Strength & Muscle Tone: within normal limits Gait & Station: normal Patient leans: N/A  Psychiatric Specialty Exam: Physical Exam  Nursing note and vitals reviewed. Psychiatric: Her speech is normal and behavior is normal. Thought content normal. Her mood appears anxious. Her affect is blunt. Cognition and memory are impaired. She expresses impulsivity.    Review of Systems  Neurological: Negative.   Psychiatric/Behavioral: Positive for substance abuse.  All other systems reviewed and are negative.   Blood pressure 100/64, pulse (!) 104, temperature 98.1 F (36.7 C), temperature source Oral, resp. rate 16, height 5\' 4"  (1.626 m), weight 63.5 kg (140 lb), last menstrual period 02/14/2018, SpO2 100 %.Body mass index is 24.03 kg/m.  General Appearance: Casual  Eye Contact:  Good  Speech:  Clear and Coherent  Volume:  Normal  Mood:  Dysphoric  Affect:  Congruent  Thought Process:  Goal Directed and Descriptions of Associations: Intact  Orientation:  Full (Time, Place, and Person)  Thought Content:  WDL  Suicidal Thoughts:  No  Homicidal Thoughts:  No  Memory:  Immediate;   Fair Recent;   Fair Remote;   Fair  Judgement:  Poor  Insight:  Lacking  Psychomotor Activity:  Normal  Concentration:  Concentration: Fair and Attention Span: Fair  Recall:  [employer] of Knowledge:  Fair  Language:  Fair  Akathisia:  No  Handed:  Right  AIMS (if indicated):     Assets:  Communication Skills Desire for Improvement Physical Health Resilience  ADL's:  Intact  Cognition:  WNL   Sleep:  Number of Hours: 6.3     Treatment Plan Summary: Daily contact with patient to assess and evaluate symptoms and progress in treatment and Medication management   Lori Miller is a 37 year old female with a history of depression and cocaine abuse admitted for suicidal and homicidal 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 Claims to have access to a gun.  #Agitation  -Thorazine 25 mg QID PRN  #Suicidal ideation, resolved -patient able to contract for safety in the hospital  #Mood/anxiety -continue Luvox to 100 mg nightly -increase Seroquel to 300 mg nightly for mood stabilization -consider minipress but BP low  #Alcohol withdrawal -continue Librium to 10 mg QID  -continue Neurontin 300 mg TID  # Substance abuse treatment -positive for cocaine and cannabis, claims heavy drinking -desires residential treatment but no longer long term  #Migraine headaches -Motrin has been helpful at home  #Smoking cessation -nicotine patch is available  #Labs -lipid panel, TSH, A1C are normal -EKG reviewed, NSR with QTc 433 -pregnancy test negative  #Admission status  -IVC  #Disposition -[facility] referral    Kristine Linea, MD 03/11/2018, 2:57 PM

## 2018-03-10 NOTE — Progress Notes (Signed)
Patient ID: Lori Miller, female   DOB: [birth year], 37 y.o.   MRN: [id number] CSW sent referral to [facility] and [facility].  Will ask Huey RomansSonya Carter LCSW to follow up on receipt and availability of bed. Jake SharkSara Josely Moffat, LCSW
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
## 2018-03-11 MED ORDER — CHLORPROMAZINE HCL 25 MG PO TABS
25.0000 mg | ORAL_TABLET | Freq: Four times a day (QID) | ORAL | Status: DC | PRN
  Administered 2018-03-11 – 2018-03-18 (×21): 25 mg via ORAL
  Filled 2018-03-11 (×23): qty 1

## 2018-03-11 NOTE — Progress Notes (Signed)
Recreation Therapy Notes  Date: 03/11/2018  Time: 9:30 am  Location: Craft Room  Behavioral response: Appropriate    Intervention Topic: Self-care  Discussion/Intervention:  Group content today was focused on Self-Care. The group defined self-care and some positive ways they care for themselves. Individuals expressed ways and reasons why they neglected any self-care in the past. Patients described ways to improve self-care in the future. The group explained what could happen if they did not do any self-care activities at all. The group participated in the intervention "self-care assessment" where they had a chance to discover some of their weaknesses and strengths in self- care. Patient came up with a self-care plan to improve themselves in the future.  Clinical Observations/Feedback:  Patient came to group and stated good hygiene is a good way to improve self-care. She stated everyone must tae care of themselves because no one else will. Individual was social with peers and staff while participating in group.  Loretta Doutt LRT/CTRS         Raylee Adamec 03/11/2018 1:00 PM

## 2018-03-11 NOTE — Plan of Care (Signed)
Patient is improving nicely, complying with medical regimen, interacting and socializing with peers, states that she is  Problem: Education: Goal: Knowledge of General Education information will improve Description Including pain rating scale, medication(s)/side effects and non-pharmacologic comfort measures Outcome: Progressing   Problem: Education: Goal: Knowledge of [place] General Education information/materials will improve Outcome: Progressing Goal: Emotional status will improve Outcome: Progressing Goal: Mental status will improve Outcome: Progressing Goal: Verbalization of understanding the information provided will improve Outcome: Progressing   Problem: Activity: Goal: Interest or engagement in activities will improve Outcome: Progressing Goal: Sleeping patterns will improve Outcome: Progressing   Problem: Coping: Goal: Ability to verbalize frustrations and anger appropriately will improve Outcome: Progressing Goal: Ability to demonstrate self-control will improve Outcome: Progressing   Problem: Health Behavior/Discharge Planning: Goal: Identification of resources available to assist in meeting health care needs will improve Outcome: Progressing Goal: Compliance with treatment plan for underlying cause of condition will improve Outcome: Progressing   Problem: Physical Regulation: Goal: Ability to maintain clinical measurements within normal limits will improve Outcome: Progressing   Problem: Safety: Goal: Periods of time without injury will increase Outcome: Progressing   Problem: Medication: Goal: Compliance with prescribed medication regimen will improve Outcome: Progressing   Problem: Self-Concept: Goal: Ability to disclose and discuss suicidal ideas will improve Outcome: Progressing Goal: Will verbalize positive feelings about self Outcome: Progressing   Problem: Education: Goal: Understanding of discharge needs will improve Outcome:  Progressing   Problem: Physical Regulation: Goal: Complications related to the disease process, condition or treatment will be avoided or minimized Outcome: Progressing   doing well and her treatment is working. Contract for safety and denies any SI/HI no signs of AVH, voice no concerns at this time, 15 minute safety rounding is maintained, sleep long hours no distress.

## 2018-03-11 NOTE — [facility] Group Notes (Signed)
[facility] Group Notes:  (Nursing/MHT/Case Management/Adjunct)  Date:  03/11/2018  Time:  12:50 AM  Type of Therapy:  Group Therapy  Participation Level:  Active  Participation Quality:    Affect:  Appropriate  Cognitive:  Alert  Insight:  Good  Engagement in Group:  Engaged  Modes of Intervention:  Support  Summary of Progress/Problems:  Lori Miller 03/11/2018, 12:50 AM

## 2018-03-11 NOTE — [facility] Group Notes (Signed)
LCSW Group Therapy Note  03/11/2018 1:00pm  Type of Therapy/Topic:  Group Therapy:  Balance in Life  Participation Level:  Minimal  Description of Group:    This group will address the concept of balance and how it feels and looks when one is unbalanced. Patients will be encouraged to process areas in their lives that are out of balance and identify reasons for remaining unbalanced. Facilitators will guide patients in utilizing problem-solving interventions to address and correct the stressor making their life unbalanced. Understanding and applying boundaries will be explored and addressed for obtaining and maintaining a balanced life. Patients will be encouraged to explore ways to assertively make their unbalanced needs known to significant others in their lives, using other group members and facilitator for support and feedback.  Therapeutic Goals: 1. Patient will identify two or more emotions or situations they have that consume much of in their lives. 2. Patient will identify signs/triggers that life has become out of balance:  3. Patient will identify two ways to set boundaries in order to achieve balance in their lives:  4. Patient will demonstrate ability to communicate their needs through discussion and/or role plays  Summary of Patient Progress:  Lori Miller was able to participate some in today's group discussion on balance in life.  Lori Miller shared that anxiety and using illicit drugs/alcohol has consumed much of her life.  Lori Miller shared that signs that she has become out of balance has occurred recently when she became suicidal.  Lori Miller has to leave group early having been taken out by her RN so she was unable to continue group discussion on ways in which she have set boundaries in order to achieve more balance in her life.    Therapeutic Modalities:   Cognitive Behavioral Therapy Solution-Focused Therapy Assertiveness Training  Lori FrameSonya S Neeka Miller, [place] 03/11/2018 4:38 PM

## 2018-03-11 NOTE — Progress Notes (Signed)
Patient ID: Lori RifeSamantha G Miller, female   DOB: 10-26-1980, 37 y.o.   MRN: [id number] CSW [name] on pt's behalf.  CSW will f/u with Admissions Coordinator to check on status on 03/12/18.

## 2018-03-11 NOTE — Plan of Care (Addendum)
Patient denies SI/HI/AVH. Patient is cooperative and pleasant during assessment. Patient denies anxiety and depression. Patient reports pain 4/10 to neck and back but denied any need for PRN relief at this time. Patient is appropriate and is present in milieu. Patient did complete self inventory today. patient did note 7/10 depression, 5/10 hopelessness, 8/10 anxiety. Patient's goal for today "sober."  A: Patient provided with scheduled medication. Patient's safety is maintained on unit.  R: Patient is complaint with medication. Patient attends group and is active in milieu.   Problem: Education: Goal: Knowledge of [place] General Education information/materials will improve Outcome: Progressing Goal: Mental status will improve Outcome: Progressing Goal: Verbalization of understanding the information provided will improve Outcome: Progressing   Problem: Safety: Goal: Periods of time without injury will increase Outcome: Progressing   Problem: Medication: Goal: Compliance with prescribed medication regimen will improve Outcome: Progressing   Problem: Self-Concept: Goal: Ability to disclose and discuss suicidal ideas will improve Outcome: Progressing

## 2018-03-11 NOTE — Progress Notes (Signed)
[facility] MD Progress Note  03/12/2018 9:39 PM Lori Miller  MRN:  [id number]  Subjective:    Ms. Piercey feels much better. Mood is improving, affect is brighter, irritability has resolved. She tolerates detox well and reports no side effects from medications. She requests STD testing.  Principal Problem: Severe recurrent major depression without psychotic features (HCC) Diagnosis:   Patient Active Problem List   Diagnosis Date Noted  . Severe recurrent major depression without psychotic features (HCC) [F33.2] 03/08/2018    Priority: High  . Alcohol use disorder, moderate, dependence (HCC) [F10.20] 03/10/2018  . OCD (obsessive compulsive disorder) [F42.9] 03/10/2018  . PTSD (post-traumatic stress disorder) [F43.10] 03/10/2018  . Suicidal ideation [R45.851] 03/08/2018  . Self-inflicted injury [Z72.89] 03/08/2018  . Tobacco use disorder [F17.200] 02/27/2017  . Benzodiazepine withdrawal (HCC) [F13.239] 02/27/2017  . Cannabis use disorder, severe, dependence (HCC) [F12.20] 02/27/2017  . Cocaine use disorder, moderate, dependence (HCC) [F14.20] 02/27/2017  . MDD (major depressive disorder), recurrent episode, moderate (HCC) [F33.1] 02/27/2017   Total Time spent with patient: 20 minutes  Past Psychiatric History: depression, substance abuse  Past Medical History:  Past Medical History:  Diagnosis Date  . Anxiety   . Anxiety   . Depression   . Depression   . Migraine   . PTSD (post-traumatic stress disorder)     Past Surgical History:  Procedure Laterality Date  . BUNIONECTOMY    . CYST EXCISION     Family History: History reviewed. No pertinent family history. Family Psychiatric  History: depression Social History:  Social History   Substance and Sexual Activity  Alcohol Use Yes  . Alcohol/week: 2.4 oz  . Types: 4 Shots of liquor per week     Social History   Substance and Sexual Activity  Drug Use Yes  . Frequency: 6.0 times per week  . Types: Cocaine,  Marijuana, Benzodiazepines   Comment: marijuana daily; crack daily x 3 months    Social History   Socioeconomic History  . Marital status: Single    Spouse name: Not on file  . Number of children: Not on file  . Years of education: Not on file  . Highest education level: Not on file  Occupational History  . Not on file  Social Needs  . Financial resource strain: Not on file  . Food insecurity:    Worry: Not on file    Inability: Not on file  . Transportation needs:    Medical: Not on file    Non-medical: Not on file  Tobacco Use  . Smoking status: Current Every Day Smoker    Packs/day: 1.00    Types: Cigarettes  . Smokeless tobacco: Never Used  Substance and Sexual Activity  . Alcohol use: Yes    Alcohol/week: 2.4 oz    Types: 4 Shots of liquor per week  . Drug use: Yes    Frequency: 6.0 times per week    Types: Cocaine, Marijuana, Benzodiazepines    Comment: marijuana daily; crack daily x 3 months  . Sexual activity: Yes  Lifestyle  . Physical activity:    Days per week: Not on file    Minutes per session: Not on file  . Stress: Not on file  Relationships  . Social connections:    Talks on phone: Not on file    Gets together: Not on file    Attends religious service: Not on file    Active member of club or organization: Not on file    Attends  meetings of clubs or organizations: Not on file    Relationship status: Not on file  Other Topics Concern  . Not on file  Social History Narrative  . Not on file   Additional Social History:    Pain Medications: See PTA Prescriptions: See PTA Over the Counter: See PTA History of alcohol / drug use?: Yes Name of Substance 1: crack cocaine 1 - Age of First Use: 18 1 - Amount (size/oz): $220 1 - Frequency: daily x 3 months 1 - Duration: a year 1 - Last Use / Amount: 03/06/18 Name of Substance 2: ETOH 2 - Age of First Use: unknown 2 - Amount (size/oz): 3-6 drinks 2 - Frequency: daily 2 - Duration: 3 months 2 -  Last Use / Amount: 03/06/18 Name of Substance 3: Marijuana 3 - Age of First Use: unknown 3 - Amount (size/oz): unknown 3 - Frequency: daily 3 - Duration: unknown 3 - Last Use / Amount: 03/06/18              Sleep: Fair  Appetite:  Fair  Current Medications: Current Facility-Administered Medications  Medication Dose Route Frequency Provider Last Rate Last Dose  . acetaminophen (TYLENOL) tablet 650 mg  650 mg Oral Q6H PRN Clapacs, John T, MD      . alum & mag hydroxide-simeth (MAALOX/MYLANTA) 200-200-20 MG/5ML suspension 30 mL  30 mL Oral Q4H PRN Clapacs, John T, MD      . chlorproMAZINE (THORAZINE) tablet 25 mg  25 mg Oral QID PRN Shanetta Nicolls B, MD   25 mg at 03/12/18 1745  . fluvoxaMINE (LUVOX) tablet 100 mg  100 mg Oral QHS Mackie Goon B, MD   100 mg at 03/12/18 2111  . gabapentin (NEURONTIN) capsule 300 mg  300 mg Oral TID Zykerria Tanton B, MD   300 mg at 03/12/18 1629  . hydrOXYzine (ATARAX/VISTARIL) tablet 50 mg  50 mg Oral TID PRN Clapacs, Jackquline DenmarkJohn T, MD   50 mg at 03/11/18 1103  . ibuprofen (ADVIL,MOTRIN) tablet 600 mg  600 mg Oral Q6H PRN Madalaine Portier B, MD   600 mg at 03/11/18 2143  . magnesium hydroxide (MILK OF MAGNESIA) suspension 30 mL  30 mL Oral Daily PRN Clapacs, John T, MD      . nicotine (NICODERM CQ - dosed in mg/24 hours) patch 21 mg  21 mg Transdermal Daily Galdino Hinchman B, MD   21 mg at 03/12/18 0819  . QUEtiapine (SEROQUEL) tablet 200 mg  200 mg Oral QHS Rosi Secrist B, MD   200 mg at 03/12/18 2112    Lab Results: No results found for this or any previous visit (from the past 48 hour(s)).  Blood Alcohol level:  Lab Results  Component Value Date   ETH <10 03/07/2018   ETH <10 06/29/2017    Metabolic Disorder Labs: Lab Results  Component Value Date   HGBA1C 5.3 03/09/2018   MPG 105.41 03/09/2018   MPG 111 02/27/2017   No results found for: PROLACTIN Lab Results  Component Value Date   CHOL 156 03/09/2018    TRIG 106 03/09/2018   HDL 57 03/09/2018   CHOLHDL 2.7 03/09/2018   VLDL 21 03/09/2018   LDLCALC 78 03/09/2018   LDLCALC 100 (H) 02/27/2017    Physical Findings: AIMS: Facial and Oral Movements Muscles of Facial Expression: None, normal Lips and Perioral Area: None, normal Jaw: None, normal Tongue: None, normal,Extremity Movements Upper (arms, wrists, hands, fingers): None, normal Lower (legs, knees, ankles, toes): None,  normal, Trunk Movements Neck, shoulders, hips: None, normal, Overall Severity Severity of abnormal movements (highest score from questions above): None, normal Incapacitation due to abnormal movements: None, normal Patient's awareness of abnormal movements (rate only patient's report): No Awareness, Dental Status Current problems with teeth and/or dentures?: No Does patient usually wear dentures?: No  CIWA:  CIWA-Ar Total: 6 COWS:     Musculoskeletal: Strength & Muscle Tone: within normal limits Gait & Station: normal Patient leans: N/A  Psychiatric Specialty Exam: Physical Exam  Nursing note and vitals reviewed. Psychiatric: Her speech is normal. Thought content normal. Her mood appears anxious. Her affect is angry. She is withdrawn. Cognition and memory are normal. She expresses impulsivity. She exhibits a depressed mood.    Review of Systems  Neurological: Negative.   Psychiatric/Behavioral: Positive for depression and substance abuse.  All other systems reviewed and are negative.   Blood pressure 93/67, pulse 93, temperature 98.5 F (36.9 C), temperature source Oral, resp. rate 18, height 5\' 4"  (1.626 m), weight 63.5 kg (140 lb), last menstrual period 02/14/2018, SpO2 99 %.Body mass index is 24.03 kg/m.  General Appearance: Casual  Eye Contact:  Good  Speech:  Clear and Coherent  Volume:  Normal  Mood:  Irritable  Affect:  Congruent  Thought Process:  Goal Directed and Descriptions of Associations: Intact  Orientation:  Full (Time, Place, and  Person)  Thought Content:  WDL  Suicidal Thoughts:  No  Homicidal Thoughts:  No  Memory:  Immediate;   Fair Recent;   Fair Remote;   Fair  Judgement:  Poor  Insight:  Lacking  Psychomotor Activity:  Normal  Concentration:  Concentration: Fair and Attention Span: Fair  Recall:  [employer] of Knowledge:  Fair  Language:  Fair  Akathisia:  No  Handed:  Right  AIMS (if indicated):     Assets:  Communication Skills Desire for Improvement Physical Health Resilience Social Support  ADL's:  Intact  Cognition:  WNL  Sleep:  Number of Hours: 6     Treatment Plan Summary: Daily contact with patient to assess and evaluate symptoms and progress in treatment and Medication management   Ms. 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 Claims to have access to a gun.  #Agitation  -Thorazine 25 mg QID PRN  #Suicidal ideation, resolved -patient able to contract for safety in the hospital  #Mood/anxiety, improved -continue Luvox to 100 mg nightly -continue Seroquel 300 mg nightlyfor mood stabilization  #Alcohol detox -completed Librium taper -continueNeurontin 300 mg TID  # Substance abuse treatment -positive for cocaine and cannabis, claims heavy drinking -desires residential treatmentbut no longer long term  #Migraine headaches -Motrin has been helpful at home  #Smoking cessation -nicotine patch is available  #Labs -lipid panel, TSH, [facility] normal -EKGreviewed, NSR with QTc 433 -pregnancy test negative -STD testing  #Admission status  -[facility]  #Disposition -may return to her friend's place -follow up with [facility] -[facility] referral     Kristine Linea, MD 03/12/2018, 9:39 PM

## 2018-03-11 NOTE — [facility] Group Notes (Signed)
LCSW Group Therapy Note 03/11/2018 9:00 AM  Type of Therapy and Topic:  Group Therapy:  Setting Goals  Participation Level:  Active  Description of Group: In this process group, patients discussed using strengths to work toward goals and address challenges.  Patients identified two positive things about themselves and one goal they were working on.  Patients were given the opportunity to share openly and support each other's plan for self-empowerment.  The group discussed the value of gratitude and were encouraged to have a daily reflection of positive characteristics or circumstances.  Patients were encouraged to identify a plan to utilize their strengths to work on current challenges and goals.  Therapeutic Goals 1. Patient will verbalize personal strengths/positive qualities and relate how these can assist with achieving desired personal goals 2. Patients will verbalize affirmation of peers plans for personal change and goal setting 3. Patients will explore the value of gratitude and positive focus as related to successful achievement of goals 4. Patients will verbalize a plan for regular reinforcement of personal positive qualities and circumstances.  Summary of Patient Progress: Lori Miller actively participated in today's group discussion on setting goals using the SMART Model.  Lori Miller shared that she has a good understanding of the SMART Model and how she can use it to establish her own life goals.  Lori Miller shared that one goal that she has been working on is to be active in her discharge plans and get the help that she needs.      Therapeutic Modalities Cognitive Behavioral Therapy Motivational Interviewing    Lori Miller, [place] 03/11/2018 12:58 PM

## 2018-03-11 NOTE — Plan of Care (Signed)
  Problem: Education: Goal: Knowledge of General Education information will improve Description Including pain rating scale, medication(s)/side effects and non-pharmacologic comfort measures Outcome: Progressing   Problem: Education: Goal: Knowledge of [place] General Education information/materials will improve Outcome: Progressing Goal: Emotional status will improve Outcome: Progressing Goal: Mental status will improve Outcome: Progressing Goal: Verbalization of understanding the information provided will improve Outcome: Progressing   Problem: Activity: Goal: Interest or engagement in activities will improve Outcome: Progressing Goal: Sleeping patterns will improve Outcome: Progressing   Problem: Coping: Goal: Ability to verbalize frustrations and anger appropriately will improve Outcome: Progressing Goal: Ability to demonstrate self-control will improve Outcome: Progressing   Problem: Health Behavior/Discharge Planning: Goal: Identification of resources available to assist in meeting health care needs will improve Outcome: Progressing Goal: Compliance with treatment plan for underlying cause of condition will improve Outcome: Progressing   Problem: Physical Regulation: Goal: Ability to maintain clinical measurements within normal limits will improve Outcome: Progressing   Problem: Safety: Goal: Periods of time without injury will increase Outcome: Progressing   Problem: Medication: Goal: Compliance with prescribed medication regimen will improve Outcome: Progressing   Problem: Self-Concept: Goal: Ability to disclose and discuss suicidal ideas will improve Outcome: Progressing Goal: Will verbalize positive feelings about self Outcome: Progressing   Problem: Education: Goal: Understanding of discharge needs will improve Outcome: Progressing   Problem: Physical Regulation: Goal: Complications related to the disease process, condition or treatment will be  avoided or minimized Outcome: Progressing

## 2018-03-12 NOTE — Progress Notes (Signed)
D: Patient denies SI/HI/AVH. Patient verbally contracts for safety. Patient is calm, cooperative and pleasant. Patient is seen in milieu interacting with peers. Patient has complaints of anxiety that are relieved by PRN medication.   A: Patient was assessed by this nurse. Patient was oriented to unit. Patient's safety was maintained on unit. Q x 15 minute observation checks were completed for safety. Patient care plan was reviewed. Patient was offered support and encouragement. Patient was encourage to attend groups, participate in unit activities and continue with plan of care.   R: Patient has no complaints of pain at this time. Patient is receptive to treatment and safety maintained on unit.

## 2018-03-12 NOTE — Plan of Care (Signed)
  Problem: Education: Goal: Knowledge of [place] General Education information/materials will improve Outcome: Progressing Goal: Mental status will improve Outcome: Progressing   Problem: Activity: Goal: Interest or engagement in activities will improve Outcome: Progressing   Problem: Medication: Goal: Compliance with prescribed medication regimen will improve Outcome: Progressing

## 2018-03-12 NOTE — Progress Notes (Signed)
Patient ID: Lori Miller, female   DOB: 1981-05-31, 37 y.o.   MRN: [id number] CSW received a call from Amy, Admissions Coordinator with [name].  CSW was informed that pt has been accepted into the program, but cannot be admitted until Thursday, March 18, 2018 at 9:00AM.  CSW will informed pt's attending physician.

## 2018-03-12 NOTE — [facility] Group Notes (Signed)
03/12/2018 1PM  Type of Therapy and Topic:  Group Therapy:  Feelings around Relapse and Recovery  Participation Level:  Did Not Attend   Description of Group:    Patients in this group will discuss emotions they experience before and after a relapse. They will process how experiencing these feelings, or avoidance of experiencing them, relates to having a relapse. Facilitator will guide patients to explore emotions they have related to recovery. Patients will be encouraged to process which emotions are more powerful. They will be guided to discuss the emotional reaction significant others in their lives may have to patients' relapse or recovery. Patients will be assisted in exploring ways to respond to the emotions of others without this contributing to a relapse.  Therapeutic Goals: 1. Patient will identify two or more emotions that lead to a relapse for them 2. Patient will identify two emotions that result when they relapse 3. Patient will identify two emotions related to recovery 4. Patient will demonstrate ability to communicate their needs through discussion and/or role plays   Summary of Patient Progress: Patient was encouraged and invited to attend group. Patient did not attend group. Social worker will continue to encourage group participation in the future.      Therapeutic Modalities:   Cognitive Behavioral Therapy Solution-Focused Therapy Assertiveness Training Relapse Prevention Therapy   Johny ShearsCassandra  Viki Carrera, LCSW 03/12/2018 2:30 PM

## 2018-03-12 NOTE — Progress Notes (Signed)
Nursing note 7p-7a  Pt observed interacting with peers on unit this shift. Displayed a flat affect and pleasant but anxious mood upon interaction with this Clinical research associatewriter. Pt complains of headache pain IBU administered with no further complaints. Pt denies SI/HI, and also denies any audio or visual hallucinations at this time. Pt is able to verbally contract for safety with this RN. Pt is now resting in bed with eyes closed, with no signs or symptoms of pain or distress noted. Pt continues to remain safe on the unit and is observed by rounding every 15 min. RN will continue to monitor.

## 2018-03-13 NOTE — Progress Notes (Addendum)
D: Patient denies SI/HI/AVH.Patient is calm, cooperative and pleasant. Patient is seen in milieu interacting with peers. Patient requests PRN anti-anxiety medication frquently. Patient reports increased anxiety thought the day. Patient has completed self inventory sheet today. Patient reports 5/10 depression, 6/10 hopelessness, 5/10 anxiety. Patient's goal for the day is left blank.   A: Patient was assessed by this nurse. Patient was oriented to unit. Patient's safety was maintained on unit. Q x 15 minute observation checks were completed for safety. Patient care plan was reviewed. Patient was offered support and encouragement. Patient was encourage to attend groups, participate in unit activities and continue with plan of care.   R: Patient has no complaints of pain at this time. Patient is receptive to treatment and safety maintained on unit.

## 2018-03-13 NOTE — Plan of Care (Signed)
Patient is interacting with her peers and socializing without any issues, compliant with her medications and voice no complains, contract for safety of self and others, sleep long hours with out any interruptions, patient denies any SI/HI and no signs of AVH, safety is maintained. No distress noted.   Problem: Education: Goal: Knowledge of General Education information will improve Description Including pain rating scale, medication(s)/side effects and non-pharmacologic comfort measures Outcome: Progressing   Problem: Education: Goal: Knowledge of [place] [facility] Education information/materials will improve Outcome: Progressing Goal: Emotional status will improve Outcome: Progressing Goal: Mental status will improve Outcome: Progressing Goal: Verbalization of understanding the information provided will improve Outcome: Progressing   Problem: Activity: Goal: Interest or engagement in activities will improve Outcome: Progressing Goal: Sleeping patterns will improve Outcome: Progressing   Problem: Coping: Goal: Ability to verbalize frustrations and anger appropriately will improve Outcome: Progressing Goal: Ability to demonstrate self-control will improve Outcome: Progressing   Problem: Health Behavior/Discharge Planning: Goal: Identification of resources available to assist in meeting health care needs will improve Outcome: Progressing Goal: Compliance with treatment plan for underlying cause of condition will improve Outcome: Progressing   Problem: Physical Regulation: Goal: Ability to maintain clinical measurements within normal limits will improve Outcome: Progressing   Problem: Safety: Goal: Periods of time without injury will increase Outcome: Progressing   Problem: Medication: Goal: Compliance with prescribed medication regimen will improve Outcome: Progressing   Problem: Self-Concept: Goal: Ability to disclose and discuss suicidal ideas will  improve Outcome: Progressing Goal: Will verbalize positive feelings about self Outcome: Progressing   Problem: Education: Goal: Understanding of discharge needs will improve Outcome: Progressing   Problem: Physical Regulation: Goal: Complications related to the disease process, condition or treatment will be avoided or minimized Outcome: Progressing

## 2018-03-13 NOTE — Progress Notes (Signed)
[facility] MD Progress Note  03/13/2018 2:13 PM Lori Miller  MRN:  [id number] Subjective: Follow-up for this patient with depression and substance abuse.  Patient is feeling reasonably stable today.  She says she is disappointed that she cannot go directly to the alcohol and drug abuse treatment Center.  Denies currently having any suicidal or homicidal thoughts.  Nursing notes that the patient still gets frequent doses of her Thorazine and seems to get anxious and agitated easily but there is no evidence of acute psychosis.  Physically appears to be stable.  She is still staying pretty disheveled however and does not look like she has very good hygiene. Principal Problem: Severe recurrent major depression without psychotic features (HCC) Diagnosis:   Patient Active Problem List   Diagnosis Date Noted  . Alcohol use disorder, moderate, dependence (HCC) [F10.20] 03/10/2018  . OCD (obsessive compulsive disorder) [F42.9] 03/10/2018  . PTSD (post-traumatic stress disorder) [F43.10] 03/10/2018  . Severe recurrent major depression without psychotic features (HCC) [F33.2] 03/08/2018  . Suicidal ideation [R45.851] 03/08/2018  . Self-inflicted injury [Z72.89] 03/08/2018  . Tobacco use disorder [F17.200] 02/27/2017  . Benzodiazepine withdrawal (HCC) [F13.239] 02/27/2017  . Cannabis use disorder, severe, dependence (HCC) [F12.20] 02/27/2017  . Cocaine use disorder, moderate, dependence (HCC) [F14.20] 02/27/2017  . MDD (major depressive disorder), recurrent episode, moderate (HCC) [F33.1] 02/27/2017   Total Time spent with patient: 30 minutes  Past Psychiatric History: Long history of depression and substance abuse problems with PTSD.  History of suicidality and dangerous behavior  Past Medical History:  Past Medical History:  Diagnosis Date  . Anxiety   . Anxiety   . Depression   . Depression   . Migraine   . PTSD (post-traumatic stress disorder)     Past Surgical History:  Procedure  Laterality Date  . BUNIONECTOMY    . CYST EXCISION     Family History: History reviewed. No pertinent family history. Family Psychiatric  History: See previous note Social History:  Social History   Substance and Sexual Activity  Alcohol Use Yes  . Alcohol/week: 2.4 oz  . Types: 4 Shots of liquor per week     Social History   Substance and Sexual Activity  Drug Use Yes  . Frequency: 6.0 times per week  . Types: Cocaine, Marijuana, Benzodiazepines   Comment: marijuana daily; crack daily x 3 months    Social History   Socioeconomic History  . Marital status: Single    Spouse name: Not on file  . Number of children: Not on file  . Years of education: Not on file  . Highest education level: Not on file  Occupational History  . Not on file  Social Needs  . Financial resource strain: Not on file  . Food insecurity:    Worry: Not on file    Inability: Not on file  . Transportation needs:    Medical: Not on file    Non-medical: Not on file  Tobacco Use  . Smoking status: Current Every Day Smoker    Packs/day: 1.00    Types: Cigarettes  . Smokeless tobacco: Never Used  Substance and Sexual Activity  . Alcohol use: Yes    Alcohol/week: 2.4 oz    Types: 4 Shots of liquor per week  . Drug use: Yes    Frequency: 6.0 times per week    Types: Cocaine, Marijuana, Benzodiazepines    Comment: marijuana daily; crack daily x 3 months  . Sexual activity: Yes  Lifestyle  .  Physical activity:    Days per week: Not on file    Minutes per session: Not on file  . Stress: Not on file  Relationships  . Social connections:    Talks on phone: Not on file    Gets together: Not on file    Attends religious service: Not on file    Active member of club or organization: Not on file    Attends meetings of clubs or organizations: Not on file    Relationship status: Not on file  Other Topics Concern  . Not on file  Social History Narrative  . Not on file   Additional Social  History:    Pain Medications: See PTA Prescriptions: See PTA Over the Counter: See PTA History of alcohol / drug use?: Yes Name of Substance 1: crack cocaine 1 - Age of First Use: 18 1 - Amount (size/oz): $220 1 - Frequency: daily x 3 months 1 - Duration: a year 1 - Last Use / Amount: 03/06/18 Name of Substance 2: ETOH 2 - Age of First Use: unknown 2 - Amount (size/oz): 3-6 drinks 2 - Frequency: daily 2 - Duration: 3 months 2 - Last Use / Amount: 03/06/18 Name of Substance 3: Marijuana 3 - Age of First Use: unknown 3 - Amount (size/oz): unknown 3 - Frequency: daily 3 - Duration: unknown 3 - Last Use / Amount: 03/06/18              Sleep: Fair  Appetite:  Fair  Current Medications: Current Facility-Administered Medications  Medication Dose Route Frequency Provider Last Rate Last Dose  . acetaminophen (TYLENOL) tablet 650 mg  650 mg Oral Q6H PRN Terez Freimark T, MD      . alum & mag hydroxide-simeth (MAALOX/MYLANTA) 200-200-20 MG/5ML suspension 30 mL  30 mL Oral Q4H PRN Zachrey Deutscher T, MD      . chlorproMAZINE (THORAZINE) tablet 25 mg  25 mg Oral QID PRN Pucilowska, Jolanta B, MD   25 mg at 03/13/18 1332  . fluvoxaMINE (LUVOX) tablet 100 mg  100 mg Oral QHS Pucilowska, Jolanta B, MD   100 mg at 03/12/18 2111  . gabapentin (NEURONTIN) capsule 300 mg  300 mg Oral TID Pucilowska, Jolanta B, MD   300 mg at 03/13/18 1159  . hydrOXYzine (ATARAX/VISTARIL) tablet 50 mg  50 mg Oral TID PRN Andreia Gandolfi, Jackquline DenmarkJohn T, MD   50 mg at 03/11/18 1103  . ibuprofen (ADVIL,MOTRIN) tablet 600 mg  600 mg Oral Q6H PRN Pucilowska, Jolanta B, MD   600 mg at 03/13/18 1332  . magnesium hydroxide (MILK OF MAGNESIA) suspension 30 mL  30 mL Oral Daily PRN Suly Vukelich T, MD      . nicotine (NICODERM CQ - dosed in mg/24 hours) patch 21 mg  21 mg Transdermal Daily Pucilowska, Jolanta B, MD   21 mg at 03/13/18 0821  . QUEtiapine (SEROQUEL) tablet 200 mg  200 mg Oral QHS Pucilowska, Jolanta B, MD   200 mg at  03/12/18 2112    Lab Results: No results found for this or any previous visit (from the past 48 hour(s)).  Blood Alcohol level:  Lab Results  Component Value Date   ETH <10 03/07/2018   ETH <10 06/29/2017    Metabolic Disorder Labs: Lab Results  Component Value Date   HGBA1C 5.3 03/09/2018   MPG 105.41 03/09/2018   MPG 111 02/27/2017   No results found for: PROLACTIN Lab Results  Component Value Date   CHOL 156  03/09/2018   TRIG 106 03/09/2018   HDL 57 03/09/2018   CHOLHDL 2.7 03/09/2018   VLDL 21 03/09/2018   LDLCALC 78 03/09/2018   LDLCALC 100 (H) 02/27/2017    Physical Findings: AIMS: Facial and Oral Movements Muscles of Facial Expression: None, normal Lips and Perioral Area: None, normal Jaw: None, normal Tongue: None, normal,Extremity Movements Upper (arms, wrists, hands, fingers): None, normal Lower (legs, knees, ankles, toes): None, normal, Trunk Movements Neck, shoulders, hips: None, normal, Overall Severity Severity of abnormal movements (highest score from questions above): None, normal Incapacitation due to abnormal movements: None, normal Patient's awareness of abnormal movements (rate only patient's report): No Awareness, Dental Status Current problems with teeth and/or dentures?: No Does patient usually wear dentures?: No  CIWA:  CIWA-Ar Total: 6 COWS:     Musculoskeletal: Strength & Muscle Tone: within normal limits Gait & Station: normal Patient leans: N/A  Psychiatric Specialty Exam: Physical Exam  Nursing note and vitals reviewed. Constitutional: She appears well-developed and well-nourished.  HENT:  Head: Normocephalic and atraumatic.  Eyes: Pupils are equal, round, and reactive to light. Conjunctivae are normal.  Neck: Normal range of motion.  Cardiovascular: Regular rhythm and normal heart sounds.  Respiratory: Effort normal. No respiratory distress.  GI: Soft. She exhibits no distension.  Musculoskeletal: Normal range of motion.   Neurological: She is alert.  Skin: Skin is warm and dry.  Psychiatric: Judgment normal. Her mood appears anxious. Her affect is blunt. Her speech is delayed. She is slowed and withdrawn. Cognition and memory are normal. She expresses no homicidal and no suicidal ideation.    Review of Systems  Constitutional: Negative.   HENT: Negative.   Eyes: Negative.   Respiratory: Negative.   Cardiovascular: Negative.   Gastrointestinal: Negative.   Musculoskeletal: Negative.   Skin: Negative.   Neurological: Negative.   Psychiatric/Behavioral: Positive for depression. Negative for hallucinations, memory loss, substance abuse and suicidal ideas. The patient is nervous/anxious. The patient does not have insomnia.     Blood pressure (!) 99/58, pulse (!) 107, temperature 97.9 F (36.6 C), temperature source Oral, resp. rate 16, height 5\' 4"  (1.626 m), weight 63.5 kg (140 lb), last menstrual period 02/14/2018, SpO2 99 %.Body mass index is 24.03 kg/m.  General Appearance: Disheveled  Eye Contact:  Fair  Speech:  Slow  Volume:  Decreased  Mood:  Dysphoric  Affect:  Congruent  Thought Process:  Goal Directed  Orientation:  Full (Time, Place, and Person)  Thought Content:  Logical  Suicidal Thoughts:  No  Homicidal Thoughts:  No  Memory:  Immediate;   Fair Recent;   Fair Remote;   Fair  Judgement:  Fair  Insight:  Fair  Psychomotor Activity:  Decreased  Concentration:  Concentration: Fair  Recall:  [employer] of Knowledge:  Fair  Language:  Fair  Akathisia:  No  Handed:  Right  AIMS (if indicated):     Assets:  Desire for Improvement Housing Physical Health  ADL's:  Intact  Cognition:  WNL  Sleep:  Number of Hours: 7.3     Treatment Plan Summary: Daily contact with patient to assess and evaluate symptoms and progress in treatment, Medication management and Plan Supportive counseling and encouragement.  No change to current medication.  Encourage patient to work on her goals for  discharge and for the future.  Mordecai Rasmussen, MD 03/13/2018, 2:13 PM

## 2018-03-13 NOTE — [facility] Group Notes (Signed)
LCSW Group Therapy Note  03/13/2018 1:15pm  Type of Therapy and Topic: Group Therapy: Holding on to Grudges   Participation Level: None   Description of Group:  In this group patients will be asked to explore and define a grudge. Patients will be guided to discuss their thoughts, feelings, and reasons as to why people have grudges. Patients will process the impact grudges have on daily life and identify thoughts and feelings related to holding grudges. Facilitator will challenge patients to identify ways to let go of grudges and the benefits this provides. Patients will be confronted to address why one struggles letting go of grudges. Lastly, patients will identify feelings and thoughts related to what life would look like without grudges. This group will be process-oriented, with patients participating in exploration of their own experiences, giving and receiving support, and processing challenge from other group members.  Therapeutic Goals:  1. Patient will identify specific grudges related to their personal life.  2. Patient will identify feelings, thoughts, and beliefs around grudges.  3. Patient will identify how one releases grudges appropriately.  4. Patient will identify situations where they could have let go of the grudge, but instead chose to hold on.   Summary of Patient Progress: The patient introduced herself and left the group discussion a few minutes after the introduction.   Therapeutic Modalities:  Cognitive Behavioral Therapy  Solution Focused Therapy  Motivational Interviewing  Brief Therapy   Erik Burkett  CUEBAS-COLON, LCSW 03/13/2018 12:46 PM

## 2018-03-14 LAB — HIV ANTIBODY (ROUTINE TESTING W REFLEX): HIV SCREEN 4TH GENERATION: NONREACTIVE

## 2018-03-14 LAB — RPR: RPR Ser Ql: NONREACTIVE

## 2018-03-14 LAB — HEPATITIS C ANTIBODY: HCV Ab: <0.1 s/co ratio (ref 0.0–0.9)

## 2018-03-14 NOTE — [facility] Group Notes (Signed)
LCSW Group Therapy Note 03/14/2018 1:15pm  Type of Therapy and Topic: Group Therapy: Feelings Around Returning Home & Establishing a Supportive Framework and Supporting Oneself When Supports Not Available  Participation Level: Did Not Attend  Description of Group:  Patients first processed thoughts and feelings about upcoming discharge. These included fears of upcoming changes, lack of change, new living environments, judgements and expectations from others and overall stigma of mental health issues. The group then discussed the definition of a supportive framework, what that looks and feels like, and how do to discern it from an unhealthy non-supportive network. The group identified different types of supports as well as what to do when your family/friends are less than helpful or unavailable  Therapeutic Goals  1. Patient will identify one healthy supportive network that they can use at discharge. 2. Patient will identify one factor of a supportive framework and how to tell it from an unhealthy network. 3. Patient able to identify one coping skill to use when they do not have positive supports from others. 4. Patient will demonstrate ability to communicate their needs through discussion and/or role plays.  Summary of Patient Progress:  Pt was invited to attend group but chose not to attend. CSW will continue to encourage pt to attend group throughout their admission.   Therapeutic Modalities Cognitive Behavioral Therapy Motivational Interviewing   Yahye Siebert  CUEBAS-COLON, LCSW 03/14/2018 12:37 PM 

## 2018-03-14 NOTE — Plan of Care (Signed)
Patient verbalizes understanding of the general information that's been provided to her and has not voiced any further questions/concerns to this writer at this time. Patient denies SI/HI/AH, however, she stated to this writer that she sees things "when I walk in my room with no lights on, I felt like somebody was there with me". Patient rates her depression/anxiety a "6/10" stating that "everything" is why she is feeling this way. Patient has shown slight interest in unit activities, however, she has been in her room majority of the day except for meal times and to get her medications. Patient has the ability to verbalize her anger/frustrations into socially appropriate behaviors. Patient has the ability to demonstrate self-control on the unit and has the ability identify the available resources that can assist her in meeting her health-care needs. Patient has been in compliance with her treatment plan and has worked towards keeping her clinical measurements within normal limits. Patient denies SI/HI/AVH and has been free from injury on the unit thus far. Patient understands her discharge needs and she has been free from any health-related complications. Patient remains safe on the unit at this time.  Problem: Education: Goal: Knowledge of General Education information will improve Description Including pain rating scale, medication(s)/side effects and non-pharmacologic comfort measures Outcome: Progressing   Problem: Education: Goal: Knowledge of [place] [facility] Education information/materials will improve Outcome: Progressing Goal: Emotional status will improve Outcome: Progressing Goal: Mental status will improve Outcome: Progressing Goal: Verbalization of understanding the information provided will improve Outcome: Progressing   Problem: Activity: Goal: Interest or engagement in activities will improve Outcome: Progressing Goal: Sleeping patterns will improve Outcome: Progressing    Problem: Coping: Goal: Ability to verbalize frustrations and anger appropriately will improve Outcome: Progressing Goal: Ability to demonstrate self-control will improve Outcome: Progressing   Problem: Health Behavior/Discharge Planning: Goal: Identification of resources available to assist in meeting health care needs will improve Outcome: Progressing Goal: Compliance with treatment plan for underlying cause of condition will improve Outcome: Progressing   Problem: Physical Regulation: Goal: Ability to maintain clinical measurements within normal limits will improve Outcome: Progressing   Problem: Safety: Goal: Periods of time without injury will increase Outcome: Progressing   Problem: Medication: Goal: Compliance with prescribed medication regimen will improve Outcome: Progressing   Problem: Self-Concept: Goal: Ability to disclose and discuss suicidal ideas will improve Outcome: Progressing Goal: Will verbalize positive feelings about self Outcome: Progressing   Problem: Education: Goal: Understanding of discharge needs will improve Outcome: Progressing   Problem: Physical Regulation: Goal: Complications related to the disease process, condition or treatment will be avoided or minimized Outcome: Progressing

## 2018-03-14 NOTE — Progress Notes (Signed)
Patient was visible in the milieu and attended evening activities. Alert and oriented. Anxious but denying thoughts of self harm. Requested Thorazine at bedtime. Patient was pleasant and did not have any other concern. Currently in bed resting. Safety precautions maintained.

## 2018-03-14 NOTE — Progress Notes (Signed)
[facility] MD Progress Note  03/14/2018 2:34 PM Lori Miller  MRN:  [id number] Subjective: No new complaint.  Mood stable.  Looking forward to possibly getting some treatment for long-term substance abuse problem Principal Problem: Severe recurrent major depression without psychotic features (HCC) Diagnosis:   Patient Active Problem List   Diagnosis Date Noted  . Alcohol use disorder, moderate, dependence (HCC) [F10.20] 03/10/2018  . OCD (obsessive compulsive disorder) [F42.9] 03/10/2018  . PTSD (post-traumatic stress disorder) [F43.10] 03/10/2018  . Severe recurrent major depression without psychotic features (HCC) [F33.2] 03/08/2018  . Suicidal ideation [R45.851] 03/08/2018  . Self-inflicted injury [Z72.89] 03/08/2018  . Tobacco use disorder [F17.200] 02/27/2017  . Benzodiazepine withdrawal (HCC) [F13.239] 02/27/2017  . Cannabis use disorder, severe, dependence (HCC) [F12.20] 02/27/2017  . Cocaine use disorder, moderate, dependence (HCC) [F14.20] 02/27/2017  . MDD (major depressive disorder), recurrent episode, moderate (HCC) [F33.1] 02/27/2017   Total Time spent with patient: 20 minutes  Past Psychiatric History: Substance abuse and depression  Past Medical History:  Past Medical History:  Diagnosis Date  . Anxiety   . Anxiety   . Depression   . Depression   . Migraine   . PTSD (post-traumatic stress disorder)     Past Surgical History:  Procedure Laterality Date  . BUNIONECTOMY    . CYST EXCISION     Family History: History reviewed. No pertinent family history. Family Psychiatric  History: See previous note Social History:  Social History   Substance and Sexual Activity  Alcohol Use Yes  . Alcohol/week: 2.4 oz  . Types: 4 Shots of liquor per week     Social History   Substance and Sexual Activity  Drug Use Yes  . Frequency: 6.0 times per week  . Types: Cocaine, Marijuana, Benzodiazepines   Comment: marijuana daily; crack daily x 3 months    Social  History   Socioeconomic History  . Marital status: Single    Spouse name: Not on file  . Number of children: Not on file  . Years of education: Not on file  . Highest education level: Not on file  Occupational History  . Not on file  Social Needs  . Financial resource strain: Not on file  . Food insecurity:    Worry: Not on file    Inability: Not on file  . Transportation needs:    Medical: Not on file    Non-medical: Not on file  Tobacco Use  . Smoking status: Current Every Day Smoker    Packs/day: 1.00    Types: Cigarettes  . Smokeless tobacco: Never Used  Substance and Sexual Activity  . Alcohol use: Yes    Alcohol/week: 2.4 oz    Types: 4 Shots of liquor per week  . Drug use: Yes    Frequency: 6.0 times per week    Types: Cocaine, Marijuana, Benzodiazepines    Comment: marijuana daily; crack daily x 3 months  . Sexual activity: Yes  Lifestyle  . Physical activity:    Days per week: Not on file    Minutes per session: Not on file  . Stress: Not on file  Relationships  . Social connections:    Talks on phone: Not on file    Gets together: Not on file    Attends religious service: Not on file    Active member of club or organization: Not on file    Attends meetings of clubs or organizations: Not on file    Relationship status: Not on file  Other Topics Concern  . Not on file  Social History Narrative  . Not on file   Additional Social History:    Pain Medications: See PTA Prescriptions: See PTA Over the Counter: See PTA History of alcohol / drug use?: Yes Name of Substance 1: crack cocaine 1 - Age of First Use: 18 1 - Amount (size/oz): $220 1 - Frequency: daily x 3 months 1 - Duration: a year 1 - Last Use / Amount: 03/06/18 Name of Substance 2: ETOH 2 - Age of First Use: unknown 2 - Amount (size/oz): 3-6 drinks 2 - Frequency: daily 2 - Duration: 3 months 2 - Last Use / Amount: 03/06/18 Name of Substance 3: Marijuana 3 - Age of First Use: unknown 3  - Amount (size/oz): unknown 3 - Frequency: daily 3 - Duration: unknown 3 - Last Use / Amount: 03/06/18              Sleep: Fair  Appetite:  Fair  Current Medications: Current Facility-Administered Medications  Medication Dose Route Frequency Provider Last Rate Last Dose  . acetaminophen (TYLENOL) tablet 650 mg  650 mg Oral Q6H PRN Rory Xiang T, MD      . alum & mag hydroxide-simeth (MAALOX/MYLANTA) 200-200-20 MG/5ML suspension 30 mL  30 mL Oral Q4H PRN Alejos Reinhardt, Jackquline Denmark, MD   30 mL at 03/13/18 2133  . chlorproMAZINE (THORAZINE) tablet 25 mg  25 mg Oral QID PRN Pucilowska, Jolanta B, MD   25 mg at 03/14/18 1156  . fluvoxaMINE (LUVOX) tablet 100 mg  100 mg Oral QHS Pucilowska, Jolanta B, MD   100 mg at 03/13/18 2132  . gabapentin (NEURONTIN) capsule 300 mg  300 mg Oral TID Pucilowska, Jolanta B, MD   300 mg at 03/14/18 1156  . hydrOXYzine (ATARAX/VISTARIL) tablet 50 mg  50 mg Oral TID PRN Aizik Reh, Jackquline Denmark, MD   50 mg at 03/11/18 1103  . ibuprofen (ADVIL,MOTRIN) tablet 600 mg  600 mg Oral Q6H PRN Pucilowska, Jolanta B, MD   600 mg at 03/13/18 1332  . magnesium hydroxide (MILK OF MAGNESIA) suspension 30 mL  30 mL Oral Daily PRN Camar Guyton T, MD      . nicotine (NICODERM CQ - dosed in mg/24 hours) patch 21 mg  21 mg Transdermal Daily Pucilowska, Jolanta B, MD   21 mg at 03/14/18 0809  . QUEtiapine (SEROQUEL) tablet 200 mg  200 mg Oral QHS Pucilowska, Jolanta B, MD   200 mg at 03/13/18 2132    Lab Results:  Results for orders placed or performed during the hospital encounter of 03/08/18 (from the past 48 hour(s))  RPR     Status: None   Collection Time: 03/12/18 10:44 PM  Result Value Ref Range   RPR Ser Ql Non Reactive Non Reactive    Comment: (NOTE) Performed At: [facility] [address] [place], [place] [id number] Jolene Schimke MD VO:[id number]   HIV antibody     Status: None   Collection Time: 03/12/18 10:44 PM  Result Value Ref Range   HIV Screen 4th  Generation wRfx Non Reactive Non Reactive    Comment: (NOTE) Performed At: [facility] [address] [place], [place] [id number] Jolene Schimke MD FI:[id number]   Hepatitis C antibody     Status: None   Collection Time: 03/12/18 10:44 PM  Result Value Ref Range   HCV Ab <0.1 0.0 - 0.9 s/co ratio    Comment: (NOTE)  Negative:     < 0.8                             Indeterminate: 0.8 - 0.9                                  Positive:     > 0.9 The CDC recommends that a positive HCV antibody result be followed up with a HCV Nucleic Acid Amplification test ([id number]). Performed At: [facility] [address] [place], [place] [id number] Jolene SchimkeNagendra Sanjai MD BJ:[id number]:[phone]     Blood Alcohol level:  Lab Results  Component Value Date   [facility] 03/[facility]   ETH <10 06/29/2017    Metabolic Disorder Labs: Lab Results  Component Value Date   HGBA1C 5.3 03/09/2018   MPG 105.41 03/09/2018   MPG 111 02/27/2017   No results found for: PROLACTIN Lab Results  Component Value Date   CHOL 156 03/09/2018   TRIG 106 03/09/2018   HDL 57 03/09/2018   CHOLHDL 2.7 03/09/2018   VLDL 21 03/09/2018   LDLCALC 78 03/09/2018   LDLCALC 100 (H) 02/27/2017    Physical Findings: AIMS: Facial and Oral Movements Muscles of Facial Expression: None, normal Lips and Perioral Area: None, normal Jaw: None, normal Tongue: None, normal,Extremity Movements Upper (arms, wrists, hands, fingers): None, normal Lower (legs, knees, ankles, toes): None, normal, Trunk Movements Neck, shoulders, hips: None, normal, Overall Severity Severity of abnormal movements (highest score from questions above): None, normal Incapacitation due to abnormal movements: None, normal Patient's awareness of abnormal movements (rate only patient's report): No Awareness, Dental Status Current problems with teeth and/or dentures?: No Does patient usually wear dentures?: No  CIWA:  CIWA-Ar  Total: 6 COWS:     Musculoskeletal: Strength & Muscle Tone: within normal limits Gait & Station: normal Patient leans: N/A  Psychiatric Specialty Exam: Physical Exam  Nursing note and vitals reviewed. Constitutional: She appears well-developed and well-nourished.  HENT:  Head: Normocephalic and atraumatic.  Eyes: Pupils are equal, round, and reactive to light. Conjunctivae are normal.  Neck: Normal range of motion.  Cardiovascular: Regular rhythm and normal heart sounds.  Respiratory: Effort normal. No respiratory distress.  GI: Soft.  Musculoskeletal: Normal range of motion.  Neurological: She is alert.  Skin: Skin is warm and dry.  Psychiatric: Judgment normal. Her affect is blunt. Her speech is delayed. She is slowed. Thought content is not paranoid. Cognition and memory are normal. She expresses no homicidal and no suicidal ideation.    Review of Systems  Constitutional: Negative.   HENT: Negative.   Eyes: Negative.   Respiratory: Negative.   Cardiovascular: Negative.   Gastrointestinal: Negative.   Musculoskeletal: Negative.   Skin: Negative.   Neurological: Negative.   Psychiatric/Behavioral: Negative for depression, hallucinations, memory loss, substance abuse and suicidal ideas. The patient is not nervous/anxious and does not have insomnia.     Blood pressure (!) 94/59, pulse (!) 110, temperature 97.7 F (36.5 C), temperature source Oral, resp. rate 18, height 5\' 4"  (1.626 m), weight 63.5 kg (140 lb), last menstrual period 02/14/2018, SpO2 99 %.Body mass index is 24.03 kg/m.  General Appearance: Casual  Eye Contact:  Good  Speech:  Clear and Coherent  Volume:  Normal  Mood:  Dysphoric  Affect:  Congruent  Thought Process:  Goal Directed  Orientation:  Full (Time, Place, and Person)  Thought Content:  Logical  Suicidal Thoughts:  No  Homicidal Thoughts:  No  Memory:  Immediate;   Fair Recent;   Fair Remote;   Fair  Judgement:  Fair  Insight:  Fair   Psychomotor Activity:  Decreased  Concentration:  Concentration: Good  Recall:  Good  Fund of Knowledge:  Good  Language:  Good  Akathisia:  No  Handed:  Right  AIMS (if indicated):     Assets:  Desire for Improvement  ADL's:  Intact  Cognition:  WNL  Sleep:  Number of Hours: 6.15     Treatment Plan Summary: Plan No change to medication.  Supportive counseling.  Review of vitals and physical exam.  Encourage patient to talk with her treatment team about discharge planning  Mordecai Rasmussen, MD 03/14/2018, 2:34 PM

## 2018-03-14 NOTE — [facility] Group Notes (Signed)
[facility] Group Notes:  (Nursing/MHT/Case Management/Adjunct)  Date:  03/14/2018  Time:  10:07 PM  Type of Therapy:  Group Therapy  Participation Level:  Active  Participation Quality:  Appropriate  Affect:  Appropriate  Cognitive:  Appropriate  Insight:  Appropriate  Engagement in Group:  Engaged  Modes of Intervention:  Discussion  Summary of Progress/Problems: Lori Miller stated she did not accomplish her goal today. Lori Miller stated she wanted to stay up, but reported she slept all day. MHT encouraged her to try again tomorrow. Lori Miller smiled and stated she would. MHT reviewed rules and expectations of unit. MHT informed patients of visitation hours and the number of visitors allowed on the unit at one time. MHT informed patients of the phone hours and the limitations of long-distance phone calls. MHT informed patients of the time vitals would be carried out in the morning. MHT encouraged good night sleep and informed calls would be made at [facility]6am for vitals. MHT informed patients of routine rounds and encouraged patients to cover appropriately. MHT informed patients of the resource board. MHT encouraged patients to link up with outpatient services. MHT explained the benefits of following through with treatment. MHT explained the importance of taking medications. MHT explained one of the most common mistakes about medications. MHT explained that as a diabetic, writer has to take medications daily or else blood sugar will rise uncontrollable. MHT provided an example of how blood sugar was low, then stop taking, and rose over 300. MHT explained when your doing well on medications, that means it's working and should continued to be taken. MHT encouraged patients to follow up with appointments and seek out treatment as needed. Lori Miller 03/14/2018, 10:07 PM

## 2018-03-14 NOTE — Progress Notes (Signed)
D- Patient alert and oriented. Patient presents in a pleasant mood on assessment stating that she slept "ok" last night and voiced no major complaints to this writer at this time. Patient stated to this writer that she was seeing things "when I walk in my room with no lights on, I felt like somebody was beside me". Patient also endorses depression and anxiety rating them both a "6/10" stating that "everything" is making her feel this way. Patient denies SI, HI, AH, and pain at this time. Patient had no stated goals for today stating to this writer "not right now".  A- Scheduled medications administered to patient, per MD orders. Support and encouragement provided.  Routine safety checks conducted every 15 minutes.  Patient informed to notify staff with problems or concerns.  R- No adverse drug reactions noted. Patient contracts for safety at this time. Patient compliant with medications and treatment plan. Patient receptive, calm, and cooperative. Patient interacts well with others on the unit.  Patient remains safe at this time.

## 2018-03-14 NOTE — Progress Notes (Signed)
[facility] MD Progress Note  03/15/2018 9:56 AM Deziah Renwick  MRN:  [id number]  Subjective:   Ms. Storlie still feel depressed and anxious. She wakes up several times during the night for nightmares. She is no longer suicidal and is very hopeful for residential treatment for substances.   She has a bed available at [facility] on Thursday morning. Her boyfriend could provide transportation.   Principal Problem: Severe recurrent major depression without psychotic features (HCC) Diagnosis:   Patient Active Problem List   Diagnosis Date Noted  . Severe recurrent major depression without psychotic features (HCC) [F33.2] 03/08/2018    Priority: High  . Alcohol use disorder, moderate, dependence (HCC) [F10.20] 03/10/2018  . OCD (obsessive compulsive disorder) [F42.9] 03/10/2018  . PTSD (post-traumatic stress disorder) [F43.10] 03/10/2018  . Suicidal ideation [R45.851] 03/08/2018  . Self-inflicted injury [Z72.89] 03/08/2018  . Tobacco use disorder [F17.200] 02/27/2017  . Benzodiazepine withdrawal (HCC) [F13.239] 02/27/2017  . Cannabis use disorder, severe, dependence (HCC) [F12.20] 02/27/2017  . Cocaine use disorder, moderate, dependence (HCC) [F14.20] 02/27/2017  . MDD (major depressive disorder), recurrent episode, moderate (HCC) [F33.1] 02/27/2017   Total Time spent with patient: 20 minutes  Past Psychiatric History: depression, substance abuse  Past Medical History:  Past Medical History:  Diagnosis Date  . Anxiety   . Anxiety   . Depression   . Depression   . Migraine   . PTSD (post-traumatic stress disorder)     Past Surgical History:  Procedure Laterality Date  . BUNIONECTOMY    . CYST EXCISION     Family History: History reviewed. No pertinent family history. Family Psychiatric  History: none Social History:  Social History   Substance and Sexual Activity  Alcohol Use Yes  . Alcohol/week: 2.4 oz  . Types: 4 Shots of liquor per week     Social History   Substance  and Sexual Activity  Drug Use Yes  . Frequency: 6.0 times per week  . Types: Cocaine, Marijuana, Benzodiazepines   Comment: marijuana daily; crack daily x 3 months    Social History   Socioeconomic History  . Marital status: Single    Spouse name: Not on file  . Number of children: Not on file  . Years of education: Not on file  . Highest education level: Not on file  Occupational History  . Not on file  Social Needs  . Financial resource strain: Not on file  . Food insecurity:    Worry: Not on file    Inability: Not on file  . Transportation needs:    Medical: Not on file    Non-medical: Not on file  Tobacco Use  . Smoking status: Current Every Day Smoker    Packs/day: 1.00    Types: Cigarettes  . Smokeless tobacco: Never Used  Substance and Sexual Activity  . Alcohol use: Yes    Alcohol/week: 2.4 oz    Types: 4 Shots of liquor per week  . Drug use: Yes    Frequency: 6.0 times per week    Types: Cocaine, Marijuana, Benzodiazepines    Comment: marijuana daily; crack daily x 3 months  . Sexual activity: Yes  Lifestyle  . Physical activity:    Days per week: Not on file    Minutes per session: Not on file  . Stress: Not on file  Relationships  . Social connections:    Talks on phone: Not on file    Gets together: Not on file    Attends religious service:  Not on file    Active member of club or organization: Not on file    Attends meetings of clubs or organizations: Not on file    Relationship status: Not on file  Other Topics Concern  . Not on file  Social History Narrative  . Not on file   Additional Social History:    Pain Medications: See PTA Prescriptions: See PTA Over the Counter: See PTA History of alcohol / drug use?: Yes Name of Substance 1: crack cocaine 1 - Age of First Use: 18 1 - Amount (size/oz): $220 1 - Frequency: daily x 3 months 1 - Duration: a year 1 - Last Use / Amount: 03/06/18 Name of Substance 2: ETOH 2 - Age of First Use:  unknown 2 - Amount (size/oz): 3-6 drinks 2 - Frequency: daily 2 - Duration: 3 months 2 - Last Use / Amount: 03/06/18 Name of Substance 3: Marijuana 3 - Age of First Use: unknown 3 - Amount (size/oz): unknown 3 - Frequency: daily 3 - Duration: unknown 3 - Last Use / Amount: 03/06/18              Sleep: Fair  Appetite:  Fair  Current Medications: Current Facility-Administered Medications  Medication Dose Route Frequency Provider Last Rate Last Dose  . acetaminophen (TYLENOL) tablet 650 mg  650 mg Oral Q6H PRN Clapacs, John T, MD      . alum & mag hydroxide-simeth (MAALOX/MYLANTA) 200-200-20 MG/5ML suspension 30 mL  30 mL Oral Q4H PRN Clapacs, Jackquline DenmarkJohn T, MD   30 mL at 03/13/18 2133  . chlorproMAZINE (THORAZINE) tablet 25 mg  25 mg Oral QID PRN Verma Grothaus B, MD   25 mg at 03/15/18 0833  . fluvoxaMINE (LUVOX) tablet 100 mg  100 mg Oral QHS Armstead Heiland B, MD   100 mg at 03/14/18 2123  . gabapentin (NEURONTIN) capsule 300 mg  300 mg Oral TID Jaevion Goto B, MD   300 mg at 03/15/18 0833  . hydrOXYzine (ATARAX/VISTARIL) tablet 50 mg  50 mg Oral TID PRN Clapacs, Jackquline DenmarkJohn T, MD   50 mg at 03/11/18 1103  . ibuprofen (ADVIL,MOTRIN) tablet 600 mg  600 mg Oral Q6H PRN Wright Gravely B, MD   600 mg at 03/15/18 0836  . magnesium hydroxide (MILK OF MAGNESIA) suspension 30 mL  30 mL Oral Daily PRN Clapacs, John T, MD      . nicotine (NICODERM CQ - dosed in mg/24 hours) patch 21 mg  21 mg Transdermal Daily Kaytlynne Neace B, MD   21 mg at 03/15/18 0832  . QUEtiapine (SEROQUEL) tablet 25 mg  25 mg Oral TID Cebert Dettmann B, MD      . QUEtiapine (SEROQUEL) tablet 300 mg  300 mg Oral QHS Tirsa Gail B, MD        Lab Results:  No results found for this or any previous visit (from the past 48 hour(s)).  Blood Alcohol level:  Lab Results  Component Value Date   ETH <10 03/07/2018   ETH <10 06/29/2017    Metabolic Disorder Labs: Lab Results  Component Value  Date   HGBA1C 5.3 03/09/2018   MPG 105.41 03/09/2018   MPG 111 02/27/2017   No results found for: PROLACTIN Lab Results  Component Value Date   CHOL 156 03/09/2018   TRIG 106 03/09/2018   HDL 57 03/09/2018   CHOLHDL 2.7 03/09/2018   VLDL 21 03/09/2018   LDLCALC 78 03/09/2018   LDLCALC 100 (H) 02/27/2017  Physical Findings: AIMS: Facial and Oral Movements Muscles of Facial Expression: None, normal Lips and Perioral Area: None, normal Jaw: None, normal Tongue: None, normal,Extremity Movements Upper (arms, wrists, hands, fingers): None, normal Lower (legs, knees, ankles, toes): None, normal, Trunk Movements Neck, shoulders, hips: None, normal, Overall Severity Severity of abnormal movements (highest score from questions above): None, normal Incapacitation due to abnormal movements: None, normal Patient's awareness of abnormal movements (rate only patient's report): No Awareness, Dental Status Current problems with teeth and/or dentures?: No Does patient usually wear dentures?: No  CIWA:  CIWA-Ar Total: 6 COWS:     Musculoskeletal: Strength & Muscle Tone: within normal limits Gait & Station: normal Patient leans: N/A  Psychiatric Specialty Exam: Physical Exam  Nursing note and vitals reviewed. Psychiatric: Her speech is normal and behavior is normal. Thought content normal. Her mood appears anxious. Cognition and memory are normal. She expresses impulsivity.    Review of Systems  Neurological: Negative.   Psychiatric/Behavioral: Positive for substance abuse.  All other systems reviewed and are negative.   Blood pressure (!) 113/102, pulse (!) 103, temperature 98.1 F (36.7 C), temperature source Oral, resp. rate 16, height 5\' 4"  (1.626 m), weight 63.5 kg (140 lb), last menstrual period 02/14/2018, SpO2 98 %.Body mass index is 24.03 kg/m.  General Appearance: Casual  Eye Contact:  Good  Speech:  Clear and Coherent  Volume:  Normal  Mood:  Anxious  Affect:   Appropriate  Thought Process:  Goal Directed and Descriptions of Associations: Intact  Orientation:  Full (Time, Place, and Person)  Thought Content:  WDL  Suicidal Thoughts:  No  Homicidal Thoughts:  No  Memory:  Immediate;   Fair Recent;   Fair Remote;   Fair  Judgement:  Poor  Insight:  Lacking  Psychomotor Activity:  Normal  Concentration:  Concentration: Fair and Attention Span: Fair  Recall:  [employer] of Knowledge:  Fair  Language:  Fair  Akathisia:  No  Handed:  Right  AIMS (if indicated):     Assets:  Communication Skills Desire for Improvement Physical Health Resilience  ADL's:  Intact  Cognition:  WNL  Sleep:  Number of Hours: 6.45     Treatment Plan Summary: Daily contact with patient to assess and evaluate symptoms and progress in treatment and Medication management   Ms. Chrestman is a 37 year old female with a history of depression and cocaine abuse admitted for suicidal and homicidal 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 Claims to have access to a gun.  #Agitation, resolved  #Suicidal ideation, resolved -patient able to contract for safety in the hospital  #Mood/anxiety, improved -continueLuvox to 100 mg nightly -continue Seroquel300mg  nightlyfor mood stabilization -add Seroquel 25 mg TID  #Alcohol detox -completed Librium taper -continueNeurontin 300 mg TID  # Substance abuse treatment -positive for cocaine and cannabis, claims heavy drinking -desires residential treatmentbut no longer long term  #Migraine headaches -Motrin has been helpful at home  #Smoking cessation -nicotine patch is available  #Labs -lipid panel, TSH, [facility] normal -EKGreviewed, NSR with QTc 433 -pregnancy test negative -STD testing was negative  #Admission status  -[facility]  #Disposition -may return to her friend's place -follow up with [facility] -[facility] referral    Kristine Linea, MD 03/15/2018, 9:56 AM

## 2018-03-14 NOTE — Plan of Care (Signed)
Anxious but was able to program in HS activities

## 2018-03-15 MED ORDER — QUETIAPINE FUMARATE 100 MG PO TABS
300.0000 mg | ORAL_TABLET | Freq: Every day | ORAL | Status: DC
  Administered 2018-03-15 – 2018-03-17 (×3): 300 mg via ORAL
  Filled 2018-03-15 (×3): qty 1

## 2018-03-15 MED ORDER — QUETIAPINE FUMARATE 25 MG PO TABS
25.0000 mg | ORAL_TABLET | Freq: Three times a day (TID) | ORAL | Status: DC
  Administered 2018-03-15 – 2018-03-18 (×9): 25 mg via ORAL
  Filled 2018-03-15 (×9): qty 1

## 2018-03-15 NOTE — Progress Notes (Signed)
Recreation Therapy Notes   Date: 03/15/2018  Time: 9:30 pm   Location: Craft Room   Behavioral response: N/A   Intervention Topic: Coping Skills  Discussion/Intervention: Patient did not attend group.   Clinical Observations/Feedback:  Patient did not attend group.   Seddrick Flax LRT/CTRS         Graelyn Bihl 03/15/2018 10:16 AM 

## 2018-03-15 NOTE — Progress Notes (Signed)
[facility] MD Progress Note  03/16/2018 10:05 AM Lori Miller  MRN:  [id number]  Subjective:   Lori Miller feels more relaxed today after we added low dose Seroquel throughout the day. She no longer feels that she has difficulties breathing. She is not suicidal. She was disappointed this morning as she does not have as much support fro long term treatment as she hoped for but still wants to go rehab and is committed to sobriety.  Transfer to [facility] on Thursday.  Principal Problem: Severe recurrent major depression without psychotic features (HCC) Diagnosis:   Patient Active Problem List   Diagnosis Date Noted  . Severe recurrent major depression without psychotic features (HCC) [F33.2] 03/08/2018    Priority: High  . Alcohol use disorder, moderate, dependence (HCC) [F10.20] 03/10/2018  . OCD (obsessive compulsive disorder) [F42.9] 03/10/2018  . PTSD (post-traumatic stress disorder) [F43.10] 03/10/2018  . Suicidal ideation [R45.851] 03/08/2018  . Self-inflicted injury [Z72.89] 03/08/2018  . Tobacco use disorder [F17.200] 02/27/2017  . Benzodiazepine withdrawal (HCC) [F13.239] 02/27/2017  . Cannabis use disorder, severe, dependence (HCC) [F12.20] 02/27/2017  . Cocaine use disorder, moderate, dependence (HCC) [F14.20] 02/27/2017  . MDD (major depressive disorder), recurrent episode, moderate (HCC) [F33.1] 02/27/2017   Total Time spent with patient: 20 minutes  Past Psychiatric History: depression, anxiety, substance abuse  Past Medical History:  Past Medical History:  Diagnosis Date  . Anxiety   . Anxiety   . Depression   . Depression   . Migraine   . PTSD (post-traumatic stress disorder)     Past Surgical History:  Procedure Laterality Date  . BUNIONECTOMY    . CYST EXCISION     Family History: History reviewed. No pertinent family history. Family Psychiatric  History: none Social History:  Social History   Substance and Sexual Activity  Alcohol Use Yes  .  Alcohol/week: 2.4 oz  . Types: 4 Shots of liquor per week     Social History   Substance and Sexual Activity  Drug Use Yes  . Frequency: 6.0 times per week  . Types: Cocaine, Marijuana, Benzodiazepines   Comment: marijuana daily; crack daily x 3 months    Social History   Socioeconomic History  . Marital status: Single    Spouse name: Not on file  . Number of children: Not on file  . Years of education: Not on file  . Highest education level: Not on file  Occupational History  . Not on file  Social Needs  . Financial resource strain: Not on file  . Food insecurity:    Worry: Not on file    Inability: Not on file  . Transportation needs:    Medical: Not on file    Non-medical: Not on file  Tobacco Use  . Smoking status: Current Every Day Smoker    Packs/day: 1.00    Types: Cigarettes  . Smokeless tobacco: Never Used  Substance and Sexual Activity  . Alcohol use: Yes    Alcohol/week: 2.4 oz    Types: 4 Shots of liquor per week  . Drug use: Yes    Frequency: 6.0 times per week    Types: Cocaine, Marijuana, Benzodiazepines    Comment: marijuana daily; crack daily x 3 months  . Sexual activity: Yes  Lifestyle  . Physical activity:    Days per week: Not on file    Minutes per session: Not on file  . Stress: Not on file  Relationships  . Social connections:    Talks on  phone: Not on file    Gets together: Not on file    Attends religious service: Not on file    Active member of club or organization: Not on file    Attends meetings of clubs or organizations: Not on file    Relationship status: Not on file  Other Topics Concern  . Not on file  Social History Narrative  . Not on file   Additional Social History:    Pain Medications: See PTA Prescriptions: See PTA Over the Counter: See PTA History of alcohol / drug use?: Yes Name of Substance 1: crack cocaine 1 - Age of First Use: 18 1 - Amount (size/oz): $220 1 - Frequency: daily x 3 months 1 - Duration: a  year 1 - Last Use / Amount: 03/06/18 Name of Substance 2: ETOH 2 - Age of First Use: unknown 2 - Amount (size/oz): 3-6 drinks 2 - Frequency: daily 2 - Duration: 3 months 2 - Last Use / Amount: 03/06/18 Name of Substance 3: Marijuana 3 - Age of First Use: unknown 3 - Amount (size/oz): unknown 3 - Frequency: daily 3 - Duration: unknown 3 - Last Use / Amount: 03/06/18              Sleep: Fair  Appetite:  Fair  Current Medications: Current Facility-Administered Medications  Medication Dose Route Frequency Provider Last Rate Last Dose  . acetaminophen (TYLENOL) tablet 650 mg  650 mg Oral Q6H PRN Clapacs, John T, MD      . alum & mag hydroxide-simeth (MAALOX/MYLANTA) 200-200-20 MG/5ML suspension 30 mL  30 mL Oral Q4H PRN Clapacs, Jackquline DenmarkJohn T, MD   30 mL at 03/16/18 0845  . chlorproMAZINE (THORAZINE) tablet 25 mg  25 mg Oral QID PRN Culver Feighner B, MD   25 mg at 03/16/18 0820  . fluvoxaMINE (LUVOX) tablet 100 mg  100 mg Oral QHS Magaline Steinberg B, MD   100 mg at 03/15/18 2116  . gabapentin (NEURONTIN) capsule 300 mg  300 mg Oral TID Jawara Latorre B, MD   300 mg at 03/16/18 0820  . hydrOXYzine (ATARAX/VISTARIL) tablet 50 mg  50 mg Oral TID PRN Clapacs, Jackquline DenmarkJohn T, MD   50 mg at 03/11/18 1103  . ibuprofen (ADVIL,MOTRIN) tablet 600 mg  600 mg Oral Q6H PRN Selene Peltzer B, MD   600 mg at 03/15/18 1722  . magnesium hydroxide (MILK OF MAGNESIA) suspension 30 mL  30 mL Oral Daily PRN Clapacs, John T, MD      . nicotine (NICODERM CQ - dosed in mg/24 hours) patch 21 mg  21 mg Transdermal Daily Seniah Lawrence B, MD   21 mg at 03/16/18 0820  . QUEtiapine (SEROQUEL) tablet 25 mg  25 mg Oral TID Klaire Court B, MD   25 mg at 03/16/18 0820  . QUEtiapine (SEROQUEL) tablet 300 mg  300 mg Oral QHS Haylyn Halberg B, MD   300 mg at 03/15/18 2117    Lab Results: No results found for this or any previous visit (from the past 48 hour(s)).  Blood Alcohol level:  Lab Results   Component Value Date   ETH <10 03/07/2018   ETH <10 06/29/2017    Metabolic Disorder Labs: Lab Results  Component Value Date   HGBA1C 5.3 03/09/2018   MPG 105.41 03/09/2018   MPG 111 02/27/2017   No results found for: PROLACTIN Lab Results  Component Value Date   CHOL 156 03/09/2018   TRIG 106 03/09/2018   HDL 57 03/09/2018  CHOLHDL 2.7 03/09/2018   VLDL 21 03/09/2018   LDLCALC 78 03/09/2018   LDLCALC 100 (H) 02/27/2017    Physical Findings: AIMS: Facial and Oral Movements Muscles of Facial Expression: None, normal Lips and Perioral Area: None, normal Jaw: None, normal Tongue: None, normal,Extremity Movements Upper (arms, wrists, hands, fingers): None, normal Lower (legs, knees, ankles, toes): None, normal, Trunk Movements Neck, shoulders, hips: None, normal, Overall Severity Severity of abnormal movements (highest score from questions above): None, normal Incapacitation due to abnormal movements: None, normal Patient's awareness of abnormal movements (rate only patient's report): No Awareness, Dental Status Current problems with teeth and/or dentures?: No Does patient usually wear dentures?: No  CIWA:  CIWA-Ar Total: 6 COWS:     Musculoskeletal: Strength & Muscle Tone: within normal limits Gait & Station: normal Patient leans: N/A  Psychiatric Specialty Exam: Physical Exam  Nursing note and vitals reviewed. Psychiatric: Her speech is normal and behavior is normal. Thought content normal. Her mood appears anxious. Cognition and memory are normal. She expresses impulsivity.    Review of Systems  Neurological: Negative.   Psychiatric/Behavioral: Positive for substance abuse.  All other systems reviewed and are negative.   Blood pressure 106/67, pulse 92, temperature 97.9 F (36.6 C), temperature source Oral, resp. rate 20, height 5\' 4"  (1.626 m), weight 63.5 kg (140 lb), last menstrual period 02/14/2018, SpO2 100 %.Body mass index is 24.03 kg/m.  General  Appearance: Casual  Eye Contact:  Good  Speech:  Clear and Coherent  Volume:  Normal  Mood:  Anxious and Euthymic  Affect:  Appropriate  Thought Process:  Goal Directed and Descriptions of Associations: Intact  Orientation:  Full (Time, Place, and Person)  Thought Content:  WDL  Suicidal Thoughts:  No  Homicidal Thoughts:  No  Memory:  Immediate;   Fair Recent;   Fair Remote;   Fair  Judgement:  Impaired  Insight:  Shallow  Psychomotor Activity:  Normal  Concentration:  Concentration: Fair and Attention Span: Fair  Recall:  [employer] of Knowledge:  Fair  Language:  Fair  Akathisia:  No  Handed:  Right  AIMS (if indicated):     Assets:  Communication Skills Desire for Improvement Physical Health Resilience  ADL's:  Intact  Cognition:  WNL  Sleep:  Number of Hours: 7.45     Treatment Plan Summary: Daily contact with patient to assess and evaluate symptoms and progress in treatment and Medication management   Lori Miller is a 37 year old female with a history of depression and cocaine abuse admitted for suicidal and homicidal 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 Claims to have access to a gun.  #Agitation, resolved  #Suicidal ideation, resolved -patient able to contract for safety in the hospital  #Mood/anxiety, improved -continueLuvox to 100 mg nightly -continueSeroquel300mg  nightlyfor mood stabilization -continue Seroquel 25 mg TID  #Alcoholdetox -completed Librium taper -continueNeurontin 300 mg TID  # Substance abuse treatment -positive for cocaine and cannabis, claims heavy drinking -desires residential treatmentbut no longer long term  #Migraine headaches -Motrin has been helpful at home  #Smoking cessation -nicotine patch is available  #Labs -lipid panel, TSH, [facility] normal -EKGreviewed, NSR with QTc 433 -pregnancy test negative -STD testing was negative  #Admission status  -[facility]  #Disposition -may return to her  friend's place -follow up with [facility] -[facility] referral    Kristine Linea, MD 03/16/2018, 10:05 AM

## 2018-03-15 NOTE — Progress Notes (Signed)
Patient was visible in the milieu, pleasant and cooperative. Denying SI but continues to express anxiety and requesting Thorazine. Patient attended group and was observed interacting with peers: was pleasant and supportive. Went to bed after taking medications and snack. Currently resting and staff continue to monitor.

## 2018-03-15 NOTE — [facility] Group Notes (Signed)
[facility] Group Notes:  (Nursing/MHT/Case Management/Adjunct)  Date:  03/15/2018  Time:  11:50 PM  Type of Therapy:  Group Therapy  Participation Level:  Active  Participation Quality:  Appropriate  Affect:  Appropriate  Cognitive:  Appropriate  Insight:  Appropriate  Engagement in Group:  Engaged  Modes of Intervention:  Discussion  Summary of Progress/Problems:  Burt EkJanice Marie Emiel Kielty 03/15/2018, 11:50 PM

## 2018-03-15 NOTE — Progress Notes (Signed)
Patient ID: Lori Miller, female   DOB: Feb 11, 1981, 37 y.o.   MRN: [id number] CSW attempted SPE with Pt's boyfriend Abbey Chattersick Oakley. He answered but said he was "laying down and would have to call back" Did not seem interested in talking about Pt or her progress.  Jake SharkSara Thor Nannini, LCSW

## 2018-03-15 NOTE — Progress Notes (Signed)
Received Lori Miller this AM after breakfast, she was compliant with her medications and requested Thorazine.She verbalized and rated her depression and anxiety 6/10, passive SI without a plan and denied hearing voices. She does endorsed seeing shallows in her room. She has been visible in the milieu at intervals and socializing with select peers.

## 2018-03-15 NOTE — Plan of Care (Signed)
Compliant with treatment 

## 2018-03-16 NOTE — [facility] Group Notes (Signed)
[facility] Group Notes:  (Nursing/MHT/Case Management/Adjunct)  Date:  03/16/2018  Time:  10:47 PM  Type of Therapy:  Group Therapy  Participation Level:  Active  Participation Quality:  Appropriate  Affect:  Appropriate  Cognitive:  Appropriate  Insight:  Appropriate  Engagement in Group:  Engaged  Modes of Intervention:  Discussion  Summary of Progress/Problems: Lori Miller stated her goal was to work on her discharge plan for the day. Lori Miller stated she was able to accomplish her goal and the plan was for her to leave on Thursday. Lori Miller stated she planned to go to another facility on Thursday morning. Lori Miller stated her hobby was playing softball. Lori Miller stated she had plenty of friends to play softball with. MHT reviewed rules and expectations of unit. MHT reviewed visitation and phone hours. MHT informed patients that vitals would be taken at 6am. MHT reminded patients to cover up for night rounds. MHT processed with patients about attending groups. MHT discussed mental health wellness MHT provided examples of how to maintain mental wellness. MHT engaged group to provide something they enjoyed doing. MHT informed patients that doing things they enjoy can help reduce and keep stress down. MHT informed patients to do things they enjoy when feeling depressed. MHT processed with patients about all the things reported that did not cost money and they would be able to do at any time. MHT reminded patients to utilize the skills learned in group while in the hospital. MHT encouraged patients to follow up with recommended treatment. MHT informed of the resources posted on the board in the hall for mental health services, housing, and SA treatment.  Jinger NeighborsKeith D Khala Tarte 03/16/2018, 10:47 PM

## 2018-03-16 NOTE — Plan of Care (Signed)
Patient vomited this morning and c/o throat pain.Patient felt relief after Mylanta.Patient in & out of room.Patient verbalized anxiety about going to [facility].Compliant with medications.Appetite and energy level good.Support and encouragement given.

## 2018-03-16 NOTE — Plan of Care (Signed)
Patient is progressing nicely  with her medical regimen without any noticeable side effects, voice no complain, sleep long hours without interruptions, denies any SI/HI and no signs of AVH noted 15 minute safety checks is in progress    Problem: Education: Goal: Knowledge of General Education information will improve Description Including pain rating scale, medication(s)/side effects and non-pharmacologic comfort measures Outcome: Progressing   Problem: Education: Goal: Knowledge of  General Education information/materials will improve Outcome: Progressing Goal: Emotional status will improve Outcome: Progressing Goal: Mental status will improve Outcome: Progressing Goal: Verbalization of understanding the information provided will improve Outcome: Progressing   Problem: Activity: Goal: Interest or engagement in activities will improve Outcome: Progressing Goal: Sleeping patterns will improve Outcome: Progressing   Problem: Coping: Goal: Ability to verbalize frustrations and anger appropriately will improve Outcome: Progressing Goal: Ability to demonstrate self-control will improve Outcome: Progressing   Problem: Health Behavior/Discharge Planning: Goal: Identification of resources available to assist in meeting health care needs will improve Outcome: Progressing Goal: Compliance with treatment plan for underlying cause of condition will improve Outcome: Progressing   Problem: Physical Regulation: Goal: Ability to maintain clinical measurements within normal limits will improve Outcome: Progressing   Problem: Safety: Goal: Periods of time without injury will increase Outcome: Progressing   Problem: Medication: Goal: Compliance with prescribed medication regimen will improve Outcome: Progressing   Problem: Self-Concept: Goal: Ability to disclose and discuss suicidal ideas will improve Outcome: Progressing Goal: Will verbalize positive feelings about  self Outcome: Progressing   Problem: Education: Goal: Understanding of discharge needs will improve Outcome: Progressing   Problem: Physical Regulation: Goal: Complications related to the disease process, condition or treatment will be avoided or minimized Outcome: Progressing

## 2018-03-16 NOTE — [facility] Group Notes (Signed)
CSW Group Therapy Note  03/16/2018  Time:  0900  Type of Therapy and Topic: Group Therapy: Goals Group: SMART Goals    Participation Level:  Did Not Attend    Description of Group:   The purpose of a daily goals group is to assist and guide patients in setting recovery/wellness-related goals. The objective is to set goals as they relate to the crisis in which they were admitted. Patients will be using SMART goal modalities to set measurable goals. Characteristics of realistic goals will be discussed and patients will be assisted in setting and processing how one will reach their goal. Facilitator will also assist patients in applying interventions and coping skills learned in psycho-education groups to the SMART goal and process how one will achieve defined goal.    Therapeutic Goals:  -Patients will develop and document one goal related to or their crisis in which brought them into treatment.  -Patients will be guided by LCSW using SMART goal setting modality in how to set a measurable, attainable, realistic and time sensitive goal.  -Patients will process barriers in reaching goal.  -Patients will process interventions in how to overcome and successful in reaching goal.    Patient's Goal:   Pt was invited to attend group but chose not to attend. CSW will continue to encourage pt to attend group throughout their admission.   Therapeutic Modalities:  Motivational Interviewing  Cognitive Behavioral Therapy  Crisis Intervention Model  SMART goals setting  Heidi DachKelsey Tammara Massing, MSW, LCSW Clinical Social Worker 03/16/2018 9:51 AM

## 2018-03-16 NOTE — [facility] Group Notes (Signed)
03/16/2018 1PM  Type of Therapy/Topic:  Group Therapy:  Feelings about Diagnosis  Participation Level:  Did Not Attend   Description of Group:   This group will allow patients to explore their thoughts and feelings about diagnoses they have received. Patients will be guided to explore their level of understanding and acceptance of these diagnoses. Facilitator will encourage patients to process their thoughts and feelings about the reactions of others to their diagnosis and will guide patients in identifying ways to discuss their diagnosis with significant others in their lives. This group will be process-oriented, with patients participating in exploration of their own experiences, giving and receiving support, and processing challenge from other group members.   Therapeutic Goals: 1. Patient will demonstrate understanding of diagnosis as evidenced by identifying two or more symptoms of the disorder 2. Patient will be able to express two feelings regarding the diagnosis 3. Patient will demonstrate their ability to communicate their needs through discussion and/or role play  Summary of Patient Progress: Patient was encouraged and invited to attend group. Patient did not attend group. Social worker will continue to encourage group participation in the future.        Therapeutic Modalities:   Cognitive Behavioral Therapy Brief Therapy Feelings Identification    Johny ShearsCassandra  Teja Judice, LCSW 03/16/2018 1:29 PM

## 2018-03-16 NOTE — Progress Notes (Signed)
[facility]  03/17/2018 5:06 PM Lori Miller  MRN:  [id number]  Subjective:    Lori Miller has new somatic complaints every day. Today her "breathing is heavy when she speaks, she did not have it before". There are no concerning respiratory symptoms. The patient hardly talks as she is mostly secluded to her room and does not participate in programming.  Transfer to [facility] tomorrow.  Principal Problem: Severe recurrent major depression without psychotic features (HCC) Diagnosis:   Patient Active Problem List   Diagnosis Date Noted  . Severe recurrent major depression without psychotic features (HCC) [F33.2] 03/08/2018    Priority: High  . Alcohol use disorder, moderate, dependence (HCC) [F10.20] 03/10/2018  . OCD (obsessive compulsive disorder) [F42.9] 03/10/2018  . PTSD (post-traumatic stress disorder) [F43.10] 03/10/2018  . Suicidal ideation [R45.851] 03/08/2018  . Self-inflicted injury [Z72.89] 03/08/2018  . Tobacco use disorder [F17.200] 02/27/2017  . Benzodiazepine withdrawal (HCC) [F13.239] 02/27/2017  . Cannabis use disorder, severe, dependence (HCC) [F12.20] 02/27/2017  . Cocaine use disorder, moderate, dependence (HCC) [F14.20] 02/27/2017  . MDD (major depressive disorder), recurrent episode, moderate (HCC) [F33.1] 02/27/2017   Total Time spent with patient: 20 minutes  Past Psychiatric History: depression, substance abuse  Past Medical History:  Past Medical History:  Diagnosis Date  . Anxiety   . Anxiety   . Depression   . Depression   . Migraine   . PTSD (post-traumatic stress disorder)     Past Surgical History:  Procedure Laterality Date  . BUNIONECTOMY    . CYST EXCISION     Family History: History reviewed. No pertinent family history. Family Psychiatric  History: none Social History:  Social History   Substance and Sexual Activity  Alcohol Use Yes  . Alcohol/week: 2.4 oz  . Types: 4 Shots of liquor per week     Social History    Substance and Sexual Activity  Drug Use Yes  . Frequency: 6.0 times per week  . Types: Cocaine, Marijuana, Benzodiazepines   Comment: marijuana daily; crack daily x 3 months    Social History   Socioeconomic History  . Marital status: Single    Spouse name: Not on file  . Number of children: Not on file  . Years of education: Not on file  . Highest education level: Not on file  Occupational History  . Not on file  Social Needs  . Financial resource strain: Not on file  . Food insecurity:    Worry: Not on file    Inability: Not on file  . Transportation needs:    Medical: Not on file    Non-medical: Not on file  Tobacco Use  . Smoking status: Current Every Day Smoker    Packs/day: 1.00    Types: Cigarettes  . Smokeless tobacco: Never Used  Substance and Sexual Activity  . Alcohol use: Yes    Alcohol/week: 2.4 oz    Types: 4 Shots of liquor per week  . Drug use: Yes    Frequency: 6.0 times per week    Types: Cocaine, Marijuana, Benzodiazepines    Comment: marijuana daily; crack daily x 3 months  . Sexual activity: Yes  Lifestyle  . Physical activity:    Days per week: Not on file    Minutes per session: Not on file  . Stress: Not on file  Relationships  . Social connections:    Talks on phone: Not on file    Gets together: Not on file  Attends religious service: Not on file    Active member of club or organization: Not on file    Attends meetings of clubs or organizations: Not on file    Relationship status: Not on file  Other Topics Concern  . Not on file  Social History Narrative  . Not on file   Additional Social History:    Pain Medications: See PTA Prescriptions: See PTA Over the Counter: See PTA History of alcohol / drug use?: Yes Name of Substance 1: crack cocaine 1 - Age of First Use: 18 1 - Amount (size/oz): $220 1 - Frequency: daily x 3 months 1 - Duration: a year 1 - Last Use / Amount: 03/06/18 Name of Substance 2: ETOH 2 - Age of  First Use: unknown 2 - Amount (size/oz): 3-6 drinks 2 - Frequency: daily 2 - Duration: 3 months 2 - Last Use / Amount: 03/06/18 Name of Substance 3: Marijuana 3 - Age of First Use: unknown 3 - Amount (size/oz): unknown 3 - Frequency: daily 3 - Duration: unknown 3 - Last Use / Amount: 03/06/18              Sleep: Fair  Appetite:  Fair  Current Medications: Current Facility-Administered Medications  Medication Dose Route Frequency Provider Last Rate Last Dose  . acetaminophen (TYLENOL) tablet 650 mg  650 mg Oral Q6H PRN Clapacs, Jackquline Denmark, MD   650 mg at 03/16/18 2059  . alum & mag hydroxide-simeth (MAALOX/MYLANTA) 200-200-20 MG/5ML suspension 30 mL  30 mL Oral Q4H PRN Clapacs, Jackquline Denmark, MD   30 mL at 03/16/18 1848  . chlorproMAZINE (THORAZINE) tablet 25 mg  25 mg Oral QID PRN Farida Mcreynolds B, MD   25 mg at 03/17/18 1423  . fluvoxaMINE (LUVOX) tablet 100 mg  100 mg Oral QHS Iasha Mccalister B, MD   100 mg at 03/16/18 2059  . gabapentin (NEURONTIN) capsule 300 mg  300 mg Oral TID Imoni Kohen B, MD   300 mg at 03/17/18 1147  . hydrOXYzine (ATARAX/VISTARIL) tablet 50 mg  50 mg Oral TID PRN Clapacs, Jackquline Denmark, MD   50 mg at 03/11/18 1103  . ibuprofen (ADVIL,MOTRIN) tablet 600 mg  600 mg Oral Q6H PRN Yehoshua Vitelli B, MD   600 mg at 03/15/18 1722  . magnesium hydroxide (MILK OF MAGNESIA) suspension 30 mL  30 mL Oral Daily PRN Clapacs, John T, MD      . nicotine (NICODERM CQ - dosed in mg/24 hours) patch 21 mg  21 mg Transdermal Daily Janille Draughon B, MD   21 mg at 03/17/18 0818  . QUEtiapine (SEROQUEL) tablet 25 mg  25 mg Oral TID Deliah Strehlow B, MD   25 mg at 03/17/18 1147  . QUEtiapine (SEROQUEL) tablet 300 mg  300 mg Oral QHS Cobin Cadavid B, MD   300 mg at 03/16/18 2100    Lab Results: No results found for this or any previous visit (from the past 48 hour(s)).  Blood Alcohol level:  Lab Results  Component Value Date   ETH <10 03/07/2018   ETH  <10 06/29/2017    Metabolic Disorder Labs: Lab Results  Component Value Date   HGBA1C 5.3 03/09/2018   MPG 105.41 03/09/2018   MPG 111 02/27/2017   No results found for: PROLACTIN Lab Results  Component Value Date   CHOL 156 03/09/2018   TRIG 106 03/09/2018   HDL 57 03/09/2018   CHOLHDL 2.7 03/09/2018   VLDL 21 03/09/2018  LDLCALC 78 03/09/2018   LDLCALC 100 (H) 02/27/2017    Physical Findings: AIMS: Facial and Oral Movements Muscles of Facial Expression: None, normal Lips and Perioral Area: None, normal Jaw: None, normal Tongue: None, normal,Extremity Movements Upper (arms, wrists, hands, fingers): None, normal Lower (legs, knees, ankles, toes): None, normal, Trunk Movements Neck, shoulders, hips: None, normal, Overall Severity Severity of abnormal movements (highest score from questions above): None, normal Incapacitation due to abnormal movements: None, normal Patient's awareness of abnormal movements (rate only patient's report): No Awareness, Dental Status Current problems with teeth and/or dentures?: No Does patient usually wear dentures?: No  CIWA:  CIWA-Ar Total: 6 COWS:     Musculoskeletal: Strength & Muscle Tone: within normal limits Gait & Station: normal Patient leans: N/A  Psychiatric Specialty Exam: Physical Exam  Nursing note and vitals reviewed. Psychiatric: Her speech is normal and behavior is normal. Thought content normal. Her mood appears anxious. Cognition and memory are normal. She expresses impulsivity.    Review of Systems  All other systems reviewed and are negative.   Blood pressure (!) 83/54, pulse (!) 112, temperature 98.4 F (36.9 C), temperature source Oral, resp. rate 16, height 5\' 4"  (1.626 m), weight 63.5 kg (140 lb), SpO2 99 %.Body mass index is 24.03 kg/m.  General Appearance: Casual  Eye Contact:  Good  Speech:  Clear and Coherent  Volume:  Normal  Mood:  Anxious  Affect:  Appropriate  Thought Process:  Goal Directed  and Descriptions of Associations: Intact  Orientation:  Full (Time, Place, and Person)  Thought Content:  WDL  Suicidal Thoughts:  No  Homicidal Thoughts:  No  Memory:  Immediate;   Fair Recent;   Fair Remote;   Fair  Judgement:  Poor  Insight:  Lacking  Psychomotor Activity:  Decreased  Concentration:  Concentration: Fair and Attention Span: Fair  Recall:  [employer] of Knowledge:  Fair  Language:  Fair  Akathisia:  No  Handed:  Right  AIMS (if indicated):     Assets:  Communication Skills Desire for Improvement Financial Resources/Insurance Housing Intimacy Physical Health Resilience Social Support  ADL's:  Intact  Cognition:  WNL  Sleep:  Number of Hours: 6.3     Treatment Plan Summary: Daily contact with patient to assess and evaluate symptoms and progress in treatment and Medication management   Ms. 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 Claims to have access to a gun.  #Agitation, resolved  #Suicidal ideation, resolved -patient able to contract for safety in the hospital  #Mood/anxiety, improved -continueLuvox to 100 mg nightly -continueSeroquel300mg  nightlyfor mood stabilization -continue Seroquel 25 mg TID  #Alcoholdetox -completed Librium taper -continueNeurontin 300 mg TID  # Substance abuse treatment -positive for cocaine and cannabis, claims heavy drinking -desires residential treatmentbut no longer long term  #Migraine headaches -Motrin has been helpful at home  #Smoking cessation -nicotine patch is available  #Labs -lipid panel, TSH, [facility] normal -EKGreviewed, NSR with QTc 433 -pregnancy test negative -STD testingwas negative  #Admission status  -IVC  #Disposition -may return to her friend's place -follow up with [facility] -transfer to [facility] on 03/18/2018    Kristine Linea, MD 03/17/2018,  5:06 PM

## 2018-03-17 MED ORDER — QUETIAPINE FUMARATE 300 MG PO TABS: 300.0000 mg | ORAL_TABLET | Freq: Every day | ORAL | 1 refills | Status: DC

## 2018-03-17 MED ORDER — QUETIAPINE FUMARATE 25 MG PO TABS: 25.0000 mg | ORAL_TABLET | Freq: Three times a day (TID) | ORAL | 1 refills | Status: DC

## 2018-03-17 MED ORDER — GABAPENTIN 300 MG PO CAPS: 300.0000 mg | ORAL_CAPSULE | Freq: Three times a day (TID) | ORAL | 1 refills | Status: DC

## 2018-03-17 MED ORDER — FLUVOXAMINE MALEATE 100 MG PO TABS: 100.0000 mg | ORAL_TABLET | Freq: Every day | ORAL | 1 refills | Status: DC
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
## 2018-03-17 NOTE — Progress Notes (Signed)
Patient ID: Lori Miller, female   DOB: 28-Nov-1980, 37 y.o.   MRN: [id number] CSW meet with pt to discuss her transportation needs to get her to her admission at [facility] on Thursday, 03/18/18.  Pt informed CSW that her boyfriend, Lori Miller, has agreed to come pick her up on Thursday morning to take her to [facility].  CSW informed pt's RN, Maryelizabeth KaufmannGiGi so she would pass this information on to the RN who will be responsible for her d/c on Thursday.

## 2018-03-17 NOTE — Progress Notes (Signed)
Patient has been in the milieu, calm and cooperative. Alert and oriented and compliant with treatment. Continues to request Thorazine reporting that it helps her with anxiety. Patient is currently denying thoughts of self harm but concerned about her anxiety. She appears to be  worried but cooperative. Received HS medications and was encouraged to talk to staff as needed. Emotional support provided. Safety precautions maintained.

## 2018-03-17 NOTE — Progress Notes (Signed)
  [facility] :  Will you be returning to the same living situation after discharge:  No. At discharge, do you have transportation home?: Yes,  pt's boyfriend will be transporting her to [facility] for ongoing tx. Do you have the ability to pay for your medications: No.  Release of information consent forms completed and in the chart;  Patient's signature needed at discharge.  Patient to Follow up at: Follow-up Information    [facility]. Go on 03/18/2018.   Why:  9:00am on 03/18/2018 for Hospital follow up and continuing Treatment Contact information: [address] [place] [address] [phone]           Next level of care provider has [facility] to [facility] Link:no  Safety Planning and Suicide Prevention discussed: Yes,  No safety concerns noted  Have you used any form of tobacco in the last 30 days? (Cigarettes, Smokeless Tobacco, Cigars, and/or Pipes): Yes  Has patient been referred to the Quitline?: Patient refused referral  Patient has been referred for addiction treatment: Yes  Alease FrameSonya S Sheriann Newmann, LCSW 03/17/2018, 3:55 PM

## 2018-03-17 NOTE — BHH Group Notes (Signed)
LCSW Group Therapy Note  03/17/2018 1:00 pm  Type of Therapy/Topic:  Group Therapy:  Emotion Regulation  Participation Level:  Active   Description of Group:    The purpose of this group is to assist patients in learning to regulate negative emotions and experience positive emotions. Patients will be guided to discuss ways in which they have been vulnerable to their negative emotions. These vulnerabilities will be juxtaposed with experiences of positive emotions or situations, and patients will be challenged to use positive emotions to combat negative ones. Special emphasis will be placed on coping with negative emotions in conflict situations, and patients will process healthy conflict resolution skills.  Therapeutic Goals: 1. Patient will identify two positive emotions or experiences to reflect on in order to balance out negative emotions 2. Patient will label two or more emotions that they find the most difficult to experience 3. Patient will demonstrate positive conflict resolution skills through discussion and/or role plays  Summary of Patient Progress:  Lori Miller actively participated in today's group discussion on emotion regulation.  Lori Miller shared that she has struggled with anxiety and depression for a long time now and she tried to use illicit drugs and alcohol to help her balance out these negative emotions, but realizes now that this was not the best way to balance out her negative emotions that actually made them worse.  Lori Miller shared that she hopes that going to residential SA tx will help her to learn more positive ways to balance out her negative feelings and that she will be able to go back to doing things that she love like playing softball and maybe work a job.     Therapeutic Modalities:   Cognitive Behavioral Therapy Feelings Identification Dialectical Behavioral Therapy

## 2018-03-17 NOTE — Plan of Care (Signed)
Active in the milieu, compliant with treatment 

## 2018-03-17 NOTE — Plan of Care (Signed)
Patient rated her depression and anxiety 4/10.Patient pleasant and cooperative in the unit.Denies SI,HI and AVH.Patient is hopeful about the program at [facility].Appetite and energy level good.Support and encouragement given.

## 2018-03-17 NOTE — Plan of Care (Signed)
Patient has improved nicely, attending groups and socializing with peers without issues ,compliant with her care regimen, voice no complain, denies SI/HI and no signs of AVH, 15 minute safety rounds in progress, no distress.  Problem: Education: Goal: Knowledge of General Education information will improve Description Including pain rating scale, medication(s)/side effects and non-pharmacologic comfort measures Outcome: Progressing   Problem: Education: Goal: Knowledge of [place] [facility] Education information/materials will improve Outcome: Progressing Goal: Emotional status will improve Outcome: Progressing Goal: Mental status will improve Outcome: Progressing Goal: Verbalization of understanding the information provided will improve Outcome: Progressing   Problem: Activity: Goal: Interest or engagement in activities will improve Outcome: Progressing Goal: Sleeping patterns will improve Outcome: Progressing   Problem: Coping: Goal: Ability to verbalize frustrations and anger appropriately will improve Outcome: Progressing Goal: Ability to demonstrate self-control will improve Outcome: Progressing   Problem: Health Behavior/Discharge Planning: Goal: Identification of resources available to assist in meeting health care needs will improve Outcome: Progressing Goal: Compliance with treatment plan for underlying cause of condition will improve Outcome: Progressing   Problem: Physical Regulation: Goal: Ability to maintain clinical measurements within normal limits will improve Outcome: Progressing   Problem: Safety: Goal: Periods of time without injury will increase Outcome: Progressing   Problem: Medication: Goal: Compliance with prescribed medication regimen will improve Outcome: Progressing   Problem: Self-Concept: Goal: Ability to disclose and discuss suicidal ideas will improve Outcome: Progressing Goal: Will verbalize positive feelings about self Outcome:  Progressing   Problem: Education: Goal: Understanding of discharge needs will improve Outcome: Progressing   Problem: Physical Regulation: Goal: Complications related to the disease process, condition or treatment will be avoided or minimized Outcome: Progressing

## 2018-03-17 NOTE — BHH Suicide Risk Assessment (Signed)
BHH Discharge Suicide Risk Assessment   Principal Problem: Severe recurrent major depression [facility](HCC[facility] Discharge Diagnoses:  Patient Active Problem List   Diagnosis Date Noted  . Severe recurrent major depression without psychotic features (HCC) [F33.2] 03/08/2018    Priority: High  . Alcohol use disorder, moderate, dependence (HCC) [F10.20] 03/10/2018  . OCD (obsessive compulsive disorder) [F42.9] 03/10/2018  . PTSD (post-traumatic stress disorder) [F43.10] 03/10/2018  . Suicidal ideation [R45.851] 03/08/2018  . Self-inflicted injury [Z72.89] 03/08/2018  . Tobacco use disorder [F17.200] 02/27/2017  . Benzodiazepine withdrawal (HCC) [F13.239] 02/27/2017  . Cannabis use disorder, severe, dependence (HCC) [F12.20] 02/27/2017  . Cocaine use disorder, moderate, dependence (HCC) [F14.20] 02/27/2017  . MDD (major depressive disorder), recurrent episode, moderate (HCC) [F33.1] 02/27/2017    Total Time spent with patient: 20 minutes  Musculoskeletal: Strength & Muscle Tone: within normal limits Gait & Station: normal Patient leans: N/A  Psychiatric Specialty Exam: Review of Systems  Neurological: Negative.   Psychiatric/Behavioral: Positive for substance abuse.  All other systems reviewed and are negative.   Blood pressure (!) 83/54, pulse (!) 112, temperature 98.4 F (36.9 C), temperature source Oral, resp. rate 16, height 5\' 4"  (1.626 m), weight 63.5 kg (140 lb), SpO2 99 %.Body mass index is 24.03 kg/m.  General Appearance: Casual  Eye Contact::  Good  Speech:  Clear and Coherent409  Volume:  Normal  Mood:  Euthymic  Affect:  Appropriate  Thought Process:  Goal Directed and Descriptions of Associations: Intact  Orientation:  Full (Time, Place, and Person)  Thought Content:  WDL  Suicidal Thoughts:  No  Homicidal Thoughts:  No  Memory:  Immediate;   Fair Recent;   Fair Remote;   Fair  Judgement:  Poor  Insight:  Shallow  Psychomotor Activity:   Normal  Concentration:  Fair  Recall:  FiservFair  Fund of Knowledge:Fair  Language: Fair  Akathisia:  No  Handed:  Right  AIMS (if indicated):     Assets:  Communication Skills Desire for Improvement Housing Intimacy Physical Health Resilience Social Support  Sleep:  Number of Hours: 6.3  Cognition: WNL  ADL's:  Intact   Mental Status Per Nursing Assessment::   On Admission:  Suicidal ideation indicated by patient, Suicide plan, Belief that plan would result in death, Thoughts of violence towards others, Plan to harm others, Intention to act on plan to harm others  Demographic Factors:  Divorced or widowed, Caucasian, Low socioeconomic status and Unemployed  Loss Factors: Financial problems/change in socioeconomic status  Historical Factors: Prior suicide attempts, Family history of mental illness or substance abuse and Impulsivity  Risk Reduction Factors:   Sense of responsibility to family, Living with another person, especially a relative and Positive social support  Continued Clinical Symptoms:  Depression:   Comorbid alcohol abuse/dependence Impulsivity Alcohol/Substance Abuse/Dependencies  Cognitive Features That Contribute To Risk:  None    Suicide Risk:  Minimal: No identifiable suicidal ideation.  Patients presenting with no risk factors but with morbid ruminations; may be classified as minimal risk based on the severity of the depressive symptoms  Follow-up Information    [facility] Go on 03/18/2018.   Why:  9:00am on 03/18/2018 for Hospital follow up and continuing Treatment Contact information: [address] [place] [address] [phone]           Plan Of Care/Follow-up recommendations:  Activity:  as tolerated Diet:  low sodium heart healthy Other:  keep follow up appointments  Kristine Linea, MD 03/17/2018, 5:09 PM

## 2018-03-17 NOTE — Progress Notes (Signed)
Recreation Therapy Notes  Date: 03/17/2018  Time: 9:30 am   Location: Craft Room   Behavioral response: N/A   Intervention Topic: Goals  Discussion/Intervention: Patient did not attend group.   Clinical Observations/Feedback:  Patient did not attend group.   Esterlene Atiyeh LRT/CTRS         Korde Jeppsen 03/17/2018 1:42 PM

## 2018-03-17 NOTE — BHH Group Notes (Signed)
BHH Group Notes:  (Nursing/MHT/Case Management/Adjunct)  Date:  03/17/2018  Time:  9:39 PM  Type of Therapy:  Group Therapy  Participation Level:  Active  Participation Quality:  Appropriate  Affect:  Appropriate  Cognitive:  Alert  Insight:  Good  Engagement in Group:  Engaged  Modes of Intervention:  Support  Summary of Progress/Problems:  [name] 03/17/2018, 9:39 PM

## 2018-03-18 NOTE — Progress Notes (Addendum)
Received Cincere this AM, she is preparing for discharge to [facility]..The officer called and stated she will be arriving to transport her to the facility.The AVS was reviewed and her questions answered. Her personal belongings were returned and she was transport without incident with her boyfriend. The Sheriffs arrived to transport, Lelon MastSamantha refused, the involuntary order was discontinued and the sheriffs were given a copy of the gront copy of the involuntary order.

## 2018-03-18 NOTE — Progress Notes (Signed)
Recreation Therapy Notes  Date: 03/18/2018  Time: 9:30 am   Location: Craft Room   Behavioral response: N/A   Intervention Topic: Time Management  Discussion/Intervention: Patient did not attend group.   Clinical Observations/Feedback:  Patient did not attend group.   [name] LRT/CTRS        Lori Miller 03/18/2018 11:26 AM 

## 2018-03-18 NOTE — Progress Notes (Signed)
Recreation Therapy Notes  INPATIENT RECREATION TR PLAN  Patient Details Name: Lori Miller MRN: [id number] DOB: [birth year] Today's Date: 03/18/2018  Rec Therapy Plan Is patient appropriate for Therapeutic Recreation?: Yes Treatment times per week: at least 3 Estimated Length of Stay: 5-7 days TR Treatment/Interventions: Group participation (Comment)  Discharge Criteria Pt will be discharged from therapy if:: Discharged Treatment plan/goals/alternatives discussed and agreed upon by:: Patient/family  Discharge Summary Short term goals set: Patient will identify 3 resources in their community to aid in recovery within 5 recreation therapy group sessions Short term goals met: Not met Progress toward goals comments: Groups attended Which groups?: (Self-care) Reason goals not met: Patient spent most of her time in her room Therapeutic equipment acquired: N/A Reason patient discharged from therapy: Discharge from hospital Pt/family agrees with progress & goals achieved: Yes Date patient discharged from therapy: 03/18/18   [name] 03/18/2018, 12:40 PM

## 2018-10-26 ENCOUNTER — Telehealth: Payer: Self-pay | Admitting: General Practice

## 2018-10-26 NOTE — Telephone Encounter (Signed)
Copied from CRM [id number]. Topic: Quick Communication - Rx Refill/Question >> Oct 26, 2018  3:22 PM Richarda Blade wrote: Medication: ALPRAZolam Prudy Feeler) tablet 0.5 mg     Has the patient contacted their pharmacy? No. (Agent: If no, request that the patient contact the pharmacy for the refill.) Patient has not received this medication in a long time but surgery doctors requested the patient get back on the medication   Preferred Pharmacy (with phone number or street name): [id number] - [place], [place] - [address], [address]  Agent: Please be advised that RX refills may take up to 3 business days. We ask that you follow-up with your pharmacy.

## 2018-10-27 NOTE — Telephone Encounter (Signed)
Last OV 08/15/2016   Last refilled not on pt's current medication list.   Next OV none scheduled   No one is pt's PCP provider.   Called pt and left a detailed VM advised pt to call to schedule for an appt.

## 2018-11-06 IMAGING — CR DG CHEST 2V
1 series · 2 of 2 positions shown · non-contrast
Comparison: None

CLINICAL DATA: Fever, possible aspiration of food, history smoking

EXAM:
CHEST  2 VIEW

[Series 1: dg chest 2 view · 0.14mm/px · 2 of 2 slices shown]
[im 1/2]
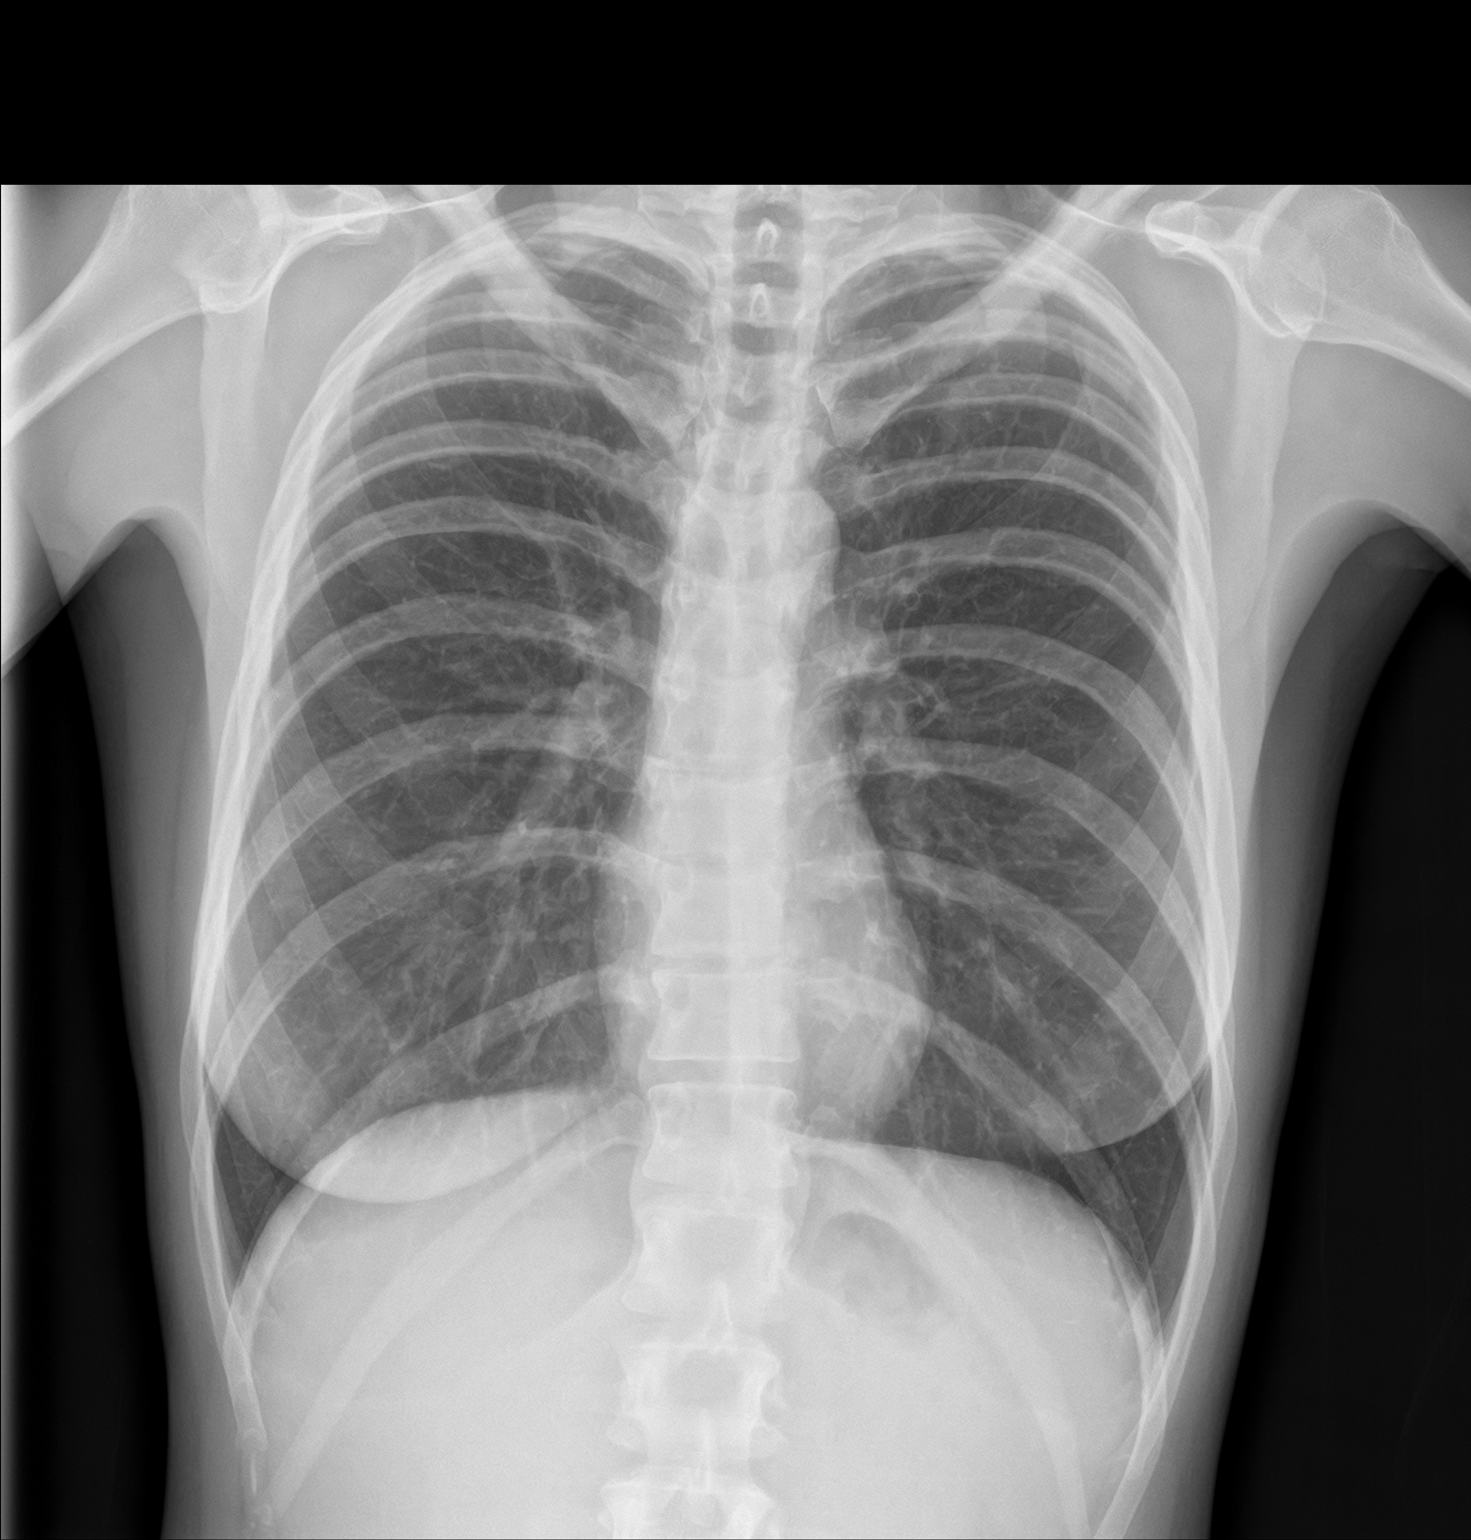
[im 2/2]
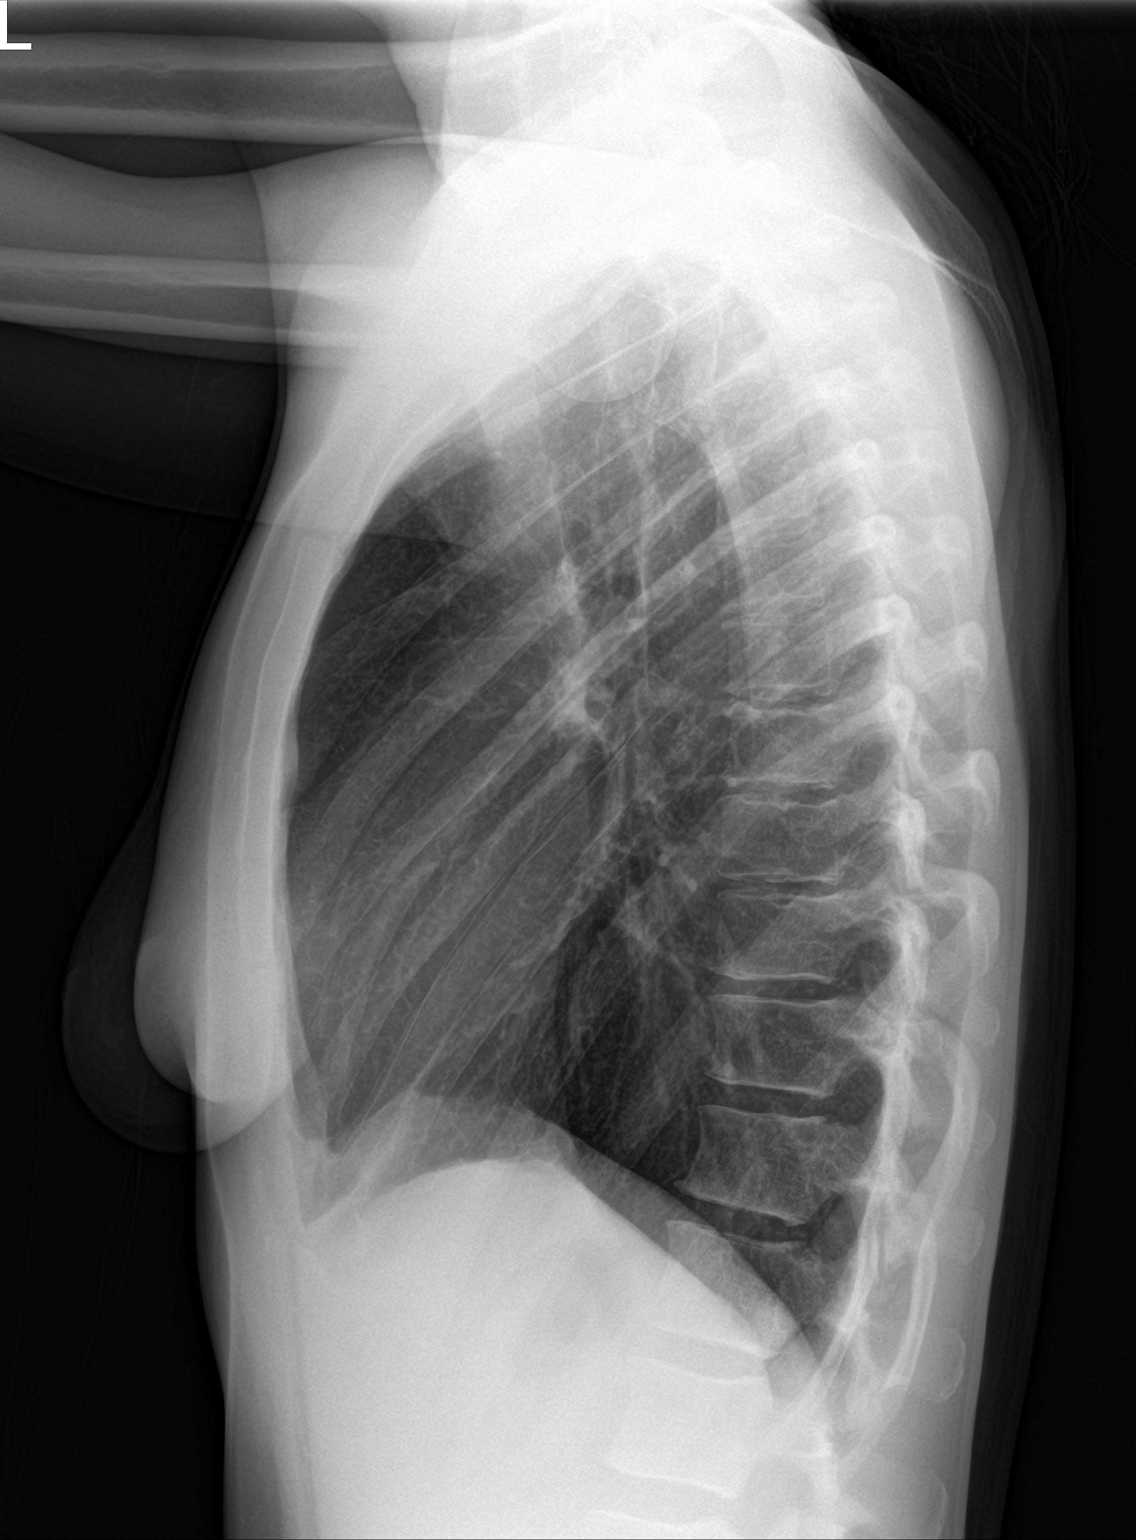

[2 of 2 positions shown; findings below may reference images not displayed]

FINDINGS: Normal heart size, mediastinal contours, and pulmonary vascularity.

Minimal peribronchial thickening.

No pulmonary infiltrate, pleural effusion, or pneumothorax.

Bones unremarkable.
IMPRESSION: Minimal bronchitic changes without infiltrate.

## 2019-10-04 ENCOUNTER — Other Ambulatory Visit: Payer: Self-pay

## 2019-10-04 ENCOUNTER — Encounter: Payer: Self-pay | Admitting: Emergency Medicine

## 2019-10-04 ENCOUNTER — Emergency Department
Admission: EM | Admit: 2019-10-04 | Discharge: 2019-10-04 | Disposition: A | Discharge status: Home / Self Care | Payer: Medicaid Other | Attending: Emergency Medicine | Admitting: Emergency Medicine

## 2019-10-04 DIAGNOSIS — F1510 Other stimulant abuse, uncomplicated: Secondary | ICD-10-CM | POA: Insufficient documentation

## 2019-10-04 DIAGNOSIS — Z79899 Other long term (current) drug therapy: Secondary | ICD-10-CM | POA: Insufficient documentation

## 2019-10-04 DIAGNOSIS — F17210 Nicotine dependence, cigarettes, uncomplicated: Secondary | ICD-10-CM | POA: Insufficient documentation

## 2019-10-04 LAB — COMPREHENSIVE METABOLIC PANEL
ALT: 29 U/L (ref 0–44)
AST: 15 U/L (ref 15–41)
Albumin: 4.3 g/dL (ref 3.5–5.0)
Alkaline Phosphatase: 68 U/L (ref 38–126)
Anion gap: 11 (ref 5–15)
BUN: 15 mg/dL (ref 6–20)
CO2: 23 mmol/L (ref 22–32)
Calcium: 9.2 mg/dL (ref 8.9–10.3)
Chloride: 105 mmol/L (ref 98–111)
Creatinine, Ser: 0.85 mg/dL (ref 0.44–1.00)
GFR calc Af Amer: >60 mL/min (ref >60)
GFR calc non Af Amer: >60 mL/min (ref >60)
Glucose, Bld: 111 mg/dL — ABNORMAL HIGH (ref 70–99)
Potassium: 3.5 mmol/L (ref 3.5–5.1)
Sodium: 139 mmol/L (ref 135–145)
Total Bilirubin: 0.8 mg/dL (ref 0.3–1.2)
Total Protein: 7 g/dL (ref 6.5–8.1)

## 2019-10-04 LAB — URINALYSIS, COMPLETE (UACMP) WITH MICROSCOPIC
Bilirubin Urine: NEGATIVE
Glucose, UA: NEGATIVE mg/dL
Ketones, ur: NEGATIVE mg/dL
Nitrite: POSITIVE — AB
Protein, ur: 100 mg/dL — AB
Specific Gravity, Urine: 1.027 (ref 1.005–1.030)
WBC, UA: >50 WBC/hpf — ABNORMAL HIGH (ref 0–5)
pH: 5 (ref 5.0–8.0)

## 2019-10-04 LAB — URINE DRUG SCREEN, QUALITATIVE (ARMC ONLY)
Amphetamines, Ur Screen: POSITIVE — AB
Barbiturates, Ur Screen: NOT DETECTED
Benzodiazepine, Ur Scrn: POSITIVE — AB
Cannabinoid 50 Ng, Ur ~~LOC~~: POSITIVE — AB
Cocaine Metabolite,Ur ~~LOC~~: NOT DETECTED
MDMA (Ecstasy)Ur Screen: NOT DETECTED
Methadone Scn, Ur: NOT DETECTED
Opiate, Ur Screen: NOT DETECTED
Phencyclidine (PCP) Ur S: NOT DETECTED
Tricyclic, Ur Screen: NOT DETECTED

## 2019-10-04 LAB — CBC WITH DIFFERENTIAL/PLATELET
Abs Immature Granulocytes: 0.01 10*3/uL (ref 0.00–0.07)
Basophils Absolute: 0.1 10*3/uL (ref 0.0–0.1)
Basophils Relative: 1 %
Eosinophils Absolute: 0.1 10*3/uL (ref 0.0–0.5)
Eosinophils Relative: 1 %
HCT: 40.5 % (ref 36.0–46.0)
Hemoglobin: 14 g/dL (ref 12.0–15.0)
Immature Granulocytes: 0 %
Lymphocytes Relative: 38 %
Lymphs Abs: 3.1 10*3/uL (ref 0.7–4.0)
MCH: 30.1 pg (ref 26.0–34.0)
MCHC: 34.6 g/dL (ref 30.0–36.0)
MCV: 87.1 fL (ref 80.0–100.0)
Monocytes Absolute: 0.3 10*3/uL (ref 0.1–1.0)
Monocytes Relative: 4 %
Neutro Abs: 4.7 10*3/uL (ref 1.7–7.7)
Neutrophils Relative %: 56 %
Platelets: 305 10*3/uL (ref 150–400)
RBC: 4.65 MIL/uL (ref 3.87–5.11)
RDW: 13.7 % (ref 11.5–15.5)
WBC: 8.3 10*3/uL (ref 4.0–10.5)
nRBC: 0 % (ref 0.0–0.2)

## 2019-10-04 LAB — POCT PREGNANCY, URINE: Preg Test, Ur: NEGATIVE

## 2019-10-04 LAB — CK: Total CK: 45 U/L (ref 38–234)

## 2019-10-04 LAB — TROPONIN I (HIGH SENSITIVITY): Troponin I (High Sensitivity): <2 ng/L (ref <18)

## 2019-10-04 MED ORDER — LORAZEPAM 2 MG/ML IJ SOLN: 1.0000 mg | Freq: Once | INTRAMUSCULAR | Status: AC

## 2019-10-04 MED ORDER — LORAZEPAM 2 MG/ML IJ SOLN
INTRAMUSCULAR | Status: AC
  Administered 2019-10-04: 03:00:00 1 mg via INTRAVENOUS
  Filled 2019-10-04: qty 1

## 2019-10-04 NOTE — ED Triage Notes (Signed)
Patient ambulatory to triage with steady gait, without difficulty or distress noted, mask in place; pt brought in by EMS; st has been using meth today and "not feeling well since"; c/o HA, SHOB and heart beating fast

## 2019-10-04 NOTE — ED Provider Notes (Signed)
[facility] Emergency Department Provider Note  ____________________________________________   First MD Initiated Contact with Patient 10/04/19 0245     (approximate)  I have reviewed the triage vital signs and the nursing notes.  Level 5 caveat history review of system limited secondary to methamphetamine intoxication HISTORY  Chief Complaint Headache and Palpitations    HPI Lori Miller is a 39 y.o. female with below list of previous medical conditions presents to the emergency department due to "I been using meth for 24 hours I will feel good".       Past Medical History:  Diagnosis Date  . Anxiety   . Anxiety   . Depression   . Depression   . Migraine   . PTSD (post-traumatic stress disorder)     Patient Active Problem List   Diagnosis Date Noted  . Alcohol use disorder, moderate, dependence (HCC) 03/10/2018  . OCD (obsessive compulsive disorder) 03/10/2018  . PTSD (post-traumatic stress disorder) 03/10/2018  . Severe recurrent major depression without psychotic features (HCC) 03/08/2018  . Suicidal ideation 03/08/2018  . Self-inflicted injury 03/08/2018  . Tobacco use disorder 02/27/2017  . Benzodiazepine withdrawal (HCC) 02/27/2017  . Cannabis use disorder, severe, dependence (HCC) 02/27/2017  . Cocaine use disorder, moderate, dependence (HCC) 02/27/2017  . MDD (major depressive disorder), recurrent episode, moderate (HCC) 02/27/2017    Past Surgical History:  Procedure Laterality Date  . BUNIONECTOMY    . CYST EXCISION      Prior to Admission medications   Medication Sig Start Date End Date Taking? Authorizing Provider  fluvoxaMINE (LUVOX) 100 MG tablet Take 1 tablet (100 mg total) by mouth at bedtime. 03/17/18   Pucilowska, Braulio Conte B, MD  gabapentin (NEURONTIN) 300 MG capsule Take 1 capsule (300 mg total) by mouth 3 (three) times daily. 03/17/18   Pucilowska, Braulio Conte B, MD  QUEtiapine (SEROQUEL) 25 MG tablet Take 1  tablet (25 mg total) by mouth 3 (three) times daily. 03/17/18   Pucilowska, Braulio Conte B, MD  QUEtiapine (SEROQUEL) 300 MG tablet Take 1 tablet (300 mg total) by mouth at bedtime. 03/17/18   Pucilowska, Ellin Goodie, MD    Allergies Flagyl [metronidazole]  No family history on file.  Social History Social History   Tobacco Use  . Smoking status: Current Every Day Smoker    Packs/day: 1.00    Types: Cigarettes  . Smokeless tobacco: Never Used  Substance Use Topics  . Alcohol use: Yes    Alcohol/week: 4.0 standard drinks    Types: 4 Shots of liquor per week  . Drug use: Yes    Frequency: 6.0 times per week    Types: Cocaine, Marijuana, Benzodiazepines    Comment: marijuana daily; crack daily x 3 months    Review of Systems Constitutional: No fever/chills Eyes: No visual changes. ENT: No sore throat. Cardiovascular: Denies chest pain. Respiratory: Denies shortness of breath. Gastrointestinal: No abdominal pain.  No nausea, no vomiting.  No diarrhea.  No constipation. Genitourinary: Negative for dysuria. Musculoskeletal: Negative for neck pain.  Negative for back pain. Integumentary: Negative for rash. Neurological: Negative for headaches, focal weakness or numbness. Psychiatric:  Positive for stated methamphetamine use   ____________________________________________   PHYSICAL EXAM:  VITAL SIGNS: ED Triage Vitals  Enc Vitals Group     BP 10/04/19 0124 139/62     Pulse Rate 10/04/19 0124 (!) 115     Resp 10/04/19 0124 (!) 24     Temp 10/04/19 0124 98 F (36.7 C)  Temp Source 10/04/19 0124 Oral     SpO2 10/04/19 0124 98 %     Weight 10/04/19 0121 52.2 kg (115 lb)     Height 10/04/19 0121 1.626 m (5\' 4" )     Head Circumference --      Peak Flow --      Pain Score 10/04/19 0121 7     Pain Loc --      Pain Edu? --      Excl. in [place]? --     Constitutional: Alert and oriented.  Very restless.  Appears intoxicated Eyes: Conjunctivae are normal.  Head:  Atraumatic. Mouth/Throat: Patient is wearing a mask. Neck: No stridor.  No meningeal signs.   Cardiovascular: Normal rate, regular rhythm. Good peripheral circulation. Grossly normal heart sounds. Respiratory: Normal respiratory effort.  No retractions. Gastrointestinal: Soft and nontender. No distention.  Musculoskeletal: No lower extremity tenderness nor edema. No gross deformities of extremities. Neurologic: Rapid speech. No gross focal neurologic deficits are appreciated.  Skin:  Skin is warm, dry and intact. Psychiatric: Mood and affect are normal. Speech and behavior are normal.  ____________________________________________   LABS (all labs ordered are listed, but only abnormal results are displayed)  Labs Reviewed  COMPREHENSIVE METABOLIC PANEL - Abnormal; Notable for the following components:      Result Value   Glucose, Bld 111 (*)    All other components within normal limits  CBC WITH DIFFERENTIAL/PLATELET  CK  URINALYSIS, COMPLETE (UACMP) WITH MICROSCOPIC  URINE DRUG SCREEN, QUALITATIVE ([facility] ONLY)  TROPONIN I (HIGH SENSITIVITY)  TROPONIN I (HIGH SENSITIVITY)   ____________________________________________  EKG  ED ECG REPORT I, Harpster N Eddison Searls, the attending physician, personally viewed and interpreted this ECG.   Date: 10/04/2019  EKG Time: 1:47 AM  Rate: 92  Rhythm: Normal sinus rhythm  Axis: Normal  Intervals: Normal  ST&T Change: None  ____________________________________________    Procedures   ____________________________________________   INITIAL IMPRESSION / MDM / ASSESSMENT AND PLAN / ED COURSE  As part of my medical decision making, I reviewed the following data within the electronic MEDICAL RECORD NUMBER 39 year old female with above-stated history and physical exam secondary to methamphetamine abuse.  Patient given 1 mg of IV Ativan and upon reevaluation patient resting comfortably at this time vital signs stable.  Patient refused  detox _____________________________________  FINAL CLINICAL IMPRESSION(S) / ED DIAGNOSES  Final diagnoses:  Methamphetamine abuse ([place])     MEDICATIONS GIVEN DURING THIS VISIT:  Medications  LORazepam (ATIVAN) injection 1 mg (1 mg Intravenous Given 10/04/19 0256)     ED Discharge Orders    None      *Please note:  Media Pizzini was evaluated in Emergency Department on 10/04/2019 for the symptoms described in the history of present illness. She was evaluated in the context of the global COVID-19 pandemic, which necessitated consideration that the patient might be at risk for infection with the SARS-CoV-2 virus that causes COVID-19. Institutional protocols and algorithms that pertain to the evaluation of patients at risk for COVID-19 are in a state of rapid change based on information released by regulatory bodies including the CDC and federal and state organizations. These policies and algorithms were followed during the patient's care in the ED.  Some ED evaluations and interventions may be delayed as a result of limited staffing during the pandemic.*  Note:  This document was prepared using Dragon voice recognition software and may include unintentional dictation errors.   Gregor Hams, MD 10/04/19 [phone]

## 2019-10-05 ENCOUNTER — Other Ambulatory Visit: Payer: Self-pay

## 2019-10-05 ENCOUNTER — Encounter: Payer: Self-pay | Admitting: Emergency Medicine

## 2019-10-05 DIAGNOSIS — Z79899 Other long term (current) drug therapy: Secondary | ICD-10-CM | POA: Insufficient documentation

## 2019-10-05 DIAGNOSIS — F17210 Nicotine dependence, cigarettes, uncomplicated: Secondary | ICD-10-CM | POA: Insufficient documentation

## 2019-10-05 DIAGNOSIS — F1910 Other psychoactive substance abuse, uncomplicated: Secondary | ICD-10-CM | POA: Insufficient documentation

## 2019-10-05 DIAGNOSIS — N730 Acute parametritis and pelvic cellulitis: Secondary | ICD-10-CM | POA: Insufficient documentation

## 2019-10-05 DIAGNOSIS — F1510 Other stimulant abuse, uncomplicated: Secondary | ICD-10-CM | POA: Insufficient documentation

## 2019-10-05 DIAGNOSIS — N12 Tubulo-interstitial nephritis, not specified as acute or chronic: Secondary | ICD-10-CM | POA: Insufficient documentation

## 2019-10-05 DIAGNOSIS — F329 Major depressive disorder, single episode, unspecified: Secondary | ICD-10-CM | POA: Insufficient documentation

## 2019-10-05 NOTE — ED Triage Notes (Signed)
Pt has been sitting outside with her boyfriend since earlier this evening (approx 7pm); pt walked inside with boyfriend to sit down in lobby; when instructed on visitation policy, pt then st she wants to be seen; pt st "I don't feel good"; c/o back pain with odorous urine

## 2019-10-06 ENCOUNTER — Emergency Department
Admission: EM | Admit: 2019-10-06 | Discharge: 2019-10-07 | Disposition: A | Discharge status: Other Acute Inpatient Hospital | Payer: Medicaid Other | Attending: Student | Admitting: Student

## 2019-10-06 ENCOUNTER — Other Ambulatory Visit: Payer: Self-pay

## 2019-10-06 ENCOUNTER — Emergency Department
Admission: EM | Admit: 2019-10-06 | Discharge: 2019-10-06 | Disposition: A | Discharge status: Home / Self Care | Payer: Self-pay | Attending: Emergency Medicine | Admitting: Emergency Medicine

## 2019-10-06 ENCOUNTER — Emergency Department: Payer: Self-pay

## 2019-10-06 DIAGNOSIS — F4310 Post-traumatic stress disorder, unspecified: Secondary | ICD-10-CM | POA: Diagnosis present

## 2019-10-06 DIAGNOSIS — F13139 Sedative, hypnotic or anxiolytic abuse with withdrawal, unspecified: Secondary | ICD-10-CM | POA: Insufficient documentation

## 2019-10-06 DIAGNOSIS — Z046 Encounter for general psychiatric examination, requested by authority: Secondary | ICD-10-CM

## 2019-10-06 DIAGNOSIS — Z7289 Other problems related to lifestyle: Secondary | ICD-10-CM

## 2019-10-06 DIAGNOSIS — F331 Major depressive disorder, recurrent, moderate: Secondary | ICD-10-CM | POA: Diagnosis present

## 2019-10-06 DIAGNOSIS — N10 Acute tubulo-interstitial nephritis: Secondary | ICD-10-CM | POA: Insufficient documentation

## 2019-10-06 DIAGNOSIS — F1510 Other stimulant abuse, uncomplicated: Secondary | ICD-10-CM | POA: Insufficient documentation

## 2019-10-06 DIAGNOSIS — F1420 Cocaine dependence, uncomplicated: Secondary | ICD-10-CM | POA: Diagnosis present

## 2019-10-06 DIAGNOSIS — F1523 Other stimulant dependence with withdrawal: Secondary | ICD-10-CM | POA: Insufficient documentation

## 2019-10-06 DIAGNOSIS — F1910 Other psychoactive substance abuse, uncomplicated: Secondary | ICD-10-CM

## 2019-10-06 DIAGNOSIS — F13239 Sedative, hypnotic or anxiolytic dependence with withdrawal, unspecified: Secondary | ICD-10-CM | POA: Diagnosis present

## 2019-10-06 DIAGNOSIS — F332 Major depressive disorder, recurrent severe without psychotic features: Secondary | ICD-10-CM | POA: Diagnosis present

## 2019-10-06 DIAGNOSIS — F13939 Sedative, hypnotic or anxiolytic use, unspecified with withdrawal, unspecified: Secondary | ICD-10-CM | POA: Diagnosis present

## 2019-10-06 DIAGNOSIS — IMO0002 Reserved for concepts with insufficient information to code with codable children: Secondary | ICD-10-CM

## 2019-10-06 DIAGNOSIS — F17210 Nicotine dependence, cigarettes, uncomplicated: Secondary | ICD-10-CM | POA: Insufficient documentation

## 2019-10-06 DIAGNOSIS — F17200 Nicotine dependence, unspecified, uncomplicated: Secondary | ICD-10-CM | POA: Diagnosis present

## 2019-10-06 DIAGNOSIS — N12 Tubulo-interstitial nephritis, not specified as acute or chronic: Secondary | ICD-10-CM

## 2019-10-06 DIAGNOSIS — Z79899 Other long term (current) drug therapy: Secondary | ICD-10-CM | POA: Insufficient documentation

## 2019-10-06 DIAGNOSIS — F1220 Cannabis dependence, uncomplicated: Secondary | ICD-10-CM | POA: Diagnosis present

## 2019-10-06 DIAGNOSIS — N730 Acute parametritis and pelvic cellulitis: Secondary | ICD-10-CM

## 2019-10-06 DIAGNOSIS — F1020 Alcohol dependence, uncomplicated: Secondary | ICD-10-CM | POA: Diagnosis present

## 2019-10-06 DIAGNOSIS — N739 Female pelvic inflammatory disease, unspecified: Secondary | ICD-10-CM | POA: Insufficient documentation

## 2019-10-06 DIAGNOSIS — Z20822 Contact with and (suspected) exposure to covid-19: Secondary | ICD-10-CM | POA: Insufficient documentation

## 2019-10-06 DIAGNOSIS — F429 Obsessive-compulsive disorder, unspecified: Secondary | ICD-10-CM | POA: Diagnosis present

## 2019-10-06 DIAGNOSIS — R45851 Suicidal ideations: Secondary | ICD-10-CM

## 2019-10-06 LAB — COMPREHENSIVE METABOLIC PANEL
ALT: 22 U/L (ref 0–44)
ALT: 25 U/L (ref 0–44)
AST: 16 U/L (ref 15–41)
AST: 20 U/L (ref 15–41)
Albumin: 4.4 g/dL (ref 3.5–5.0)
Albumin: 4.8 g/dL (ref 3.5–5.0)
Alkaline Phosphatase: 65 U/L (ref 38–126)
Alkaline Phosphatase: 68 U/L (ref 38–126)
Anion gap: 5 (ref 5–15)
Anion gap: 8 (ref 5–15)
BUN: 19 mg/dL (ref 6–20)
BUN: 19 mg/dL (ref 6–20)
CO2: 27 mmol/L (ref 22–32)
CO2: 29 mmol/L (ref 22–32)
Calcium: 9.2 mg/dL (ref 8.9–10.3)
Calcium: 9.5 mg/dL (ref 8.9–10.3)
Chloride: 105 mmol/L (ref 98–111)
Chloride: 105 mmol/L (ref 98–111)
Creatinine, Ser: 0.79 mg/dL (ref 0.44–1.00)
Creatinine, Ser: 0.91 mg/dL (ref 0.44–1.00)
GFR calc Af Amer: >60 mL/min (ref >60)
GFR calc Af Amer: >60 mL/min (ref >60)
GFR calc non Af Amer: >60 mL/min (ref >60)
GFR calc non Af Amer: >60 mL/min (ref >60)
Glucose, Bld: 102 mg/dL — ABNORMAL HIGH (ref 70–99)
Glucose, Bld: 107 mg/dL — ABNORMAL HIGH (ref 70–99)
Potassium: 3.5 mmol/L (ref 3.5–5.1)
Potassium: 4.3 mmol/L (ref 3.5–5.1)
Sodium: 139 mmol/L (ref 135–145)
Sodium: 140 mmol/L (ref 135–145)
Total Bilirubin: 0.4 mg/dL (ref 0.3–1.2)
Total Bilirubin: 0.5 mg/dL (ref 0.3–1.2)
Total Protein: 7.1 g/dL (ref 6.5–8.1)
Total Protein: 7.5 g/dL (ref 6.5–8.1)

## 2019-10-06 LAB — URINE DRUG SCREEN, QUALITATIVE (ARMC ONLY)
Amphetamines, Ur Screen: POSITIVE — AB
Amphetamines, Ur Screen: POSITIVE — AB
Barbiturates, Ur Screen: NOT DETECTED
Barbiturates, Ur Screen: NOT DETECTED
Benzodiazepine, Ur Scrn: POSITIVE — AB
Benzodiazepine, Ur Scrn: NOT DETECTED
Cannabinoid 50 Ng, Ur ~~LOC~~: POSITIVE — AB
Cannabinoid 50 Ng, Ur ~~LOC~~: POSITIVE — AB
Cocaine Metabolite,Ur ~~LOC~~: NOT DETECTED
Cocaine Metabolite,Ur ~~LOC~~: NOT DETECTED
MDMA (Ecstasy)Ur Screen: NOT DETECTED
MDMA (Ecstasy)Ur Screen: NOT DETECTED
Methadone Scn, Ur: NOT DETECTED
Methadone Scn, Ur: NOT DETECTED
Opiate, Ur Screen: NOT DETECTED
Opiate, Ur Screen: NOT DETECTED
Phencyclidine (PCP) Ur S: NOT DETECTED
Phencyclidine (PCP) Ur S: NOT DETECTED
Tricyclic, Ur Screen: NOT DETECTED
Tricyclic, Ur Screen: NOT DETECTED

## 2019-10-06 LAB — CBC WITH DIFFERENTIAL/PLATELET
Abs Immature Granulocytes: 0.04 10*3/uL (ref 0.00–0.07)
Basophils Absolute: 0.1 10*3/uL (ref 0.0–0.1)
Basophils Relative: 1 %
Eosinophils Absolute: 0.1 10*3/uL (ref 0.0–0.5)
Eosinophils Relative: 1 %
HCT: 41.5 % (ref 36.0–46.0)
Hemoglobin: 13.7 g/dL (ref 12.0–15.0)
Immature Granulocytes: 0 %
Lymphocytes Relative: 31 %
Lymphs Abs: 3.3 10*3/uL (ref 0.7–4.0)
MCH: 29.3 pg (ref 26.0–34.0)
MCHC: 33 g/dL (ref 30.0–36.0)
MCV: 88.9 fL (ref 80.0–100.0)
Monocytes Absolute: 0.5 10*3/uL (ref 0.1–1.0)
Monocytes Relative: 5 %
Neutro Abs: 6.7 10*3/uL (ref 1.7–7.7)
Neutrophils Relative %: 62 %
Platelets: 279 10*3/uL (ref 150–400)
RBC: 4.67 MIL/uL (ref 3.87–5.11)
RDW: 13.9 % (ref 11.5–15.5)
WBC: 10.7 10*3/uL — ABNORMAL HIGH (ref 4.0–10.5)
nRBC: 0 % (ref 0.0–0.2)

## 2019-10-06 LAB — CBC
HCT: 40 % (ref 36.0–46.0)
Hemoglobin: 13.3 g/dL (ref 12.0–15.0)
MCH: 29.6 pg (ref 26.0–34.0)
MCHC: 33.3 g/dL (ref 30.0–36.0)
MCV: 89.1 fL (ref 80.0–100.0)
Platelets: 293 10*3/uL (ref 150–400)
RBC: 4.49 MIL/uL (ref 3.87–5.11)
RDW: 13.6 % (ref 11.5–15.5)
WBC: 7.9 10*3/uL (ref 4.0–10.5)
nRBC: 0 % (ref 0.0–0.2)

## 2019-10-06 LAB — URINALYSIS, COMPLETE (UACMP) WITH MICROSCOPIC
Bilirubin Urine: NEGATIVE
Glucose, UA: NEGATIVE mg/dL
Ketones, ur: NEGATIVE mg/dL
Nitrite: POSITIVE — AB
Protein, ur: 30 mg/dL — AB
Specific Gravity, Urine: 1.026 (ref 1.005–1.030)
pH: 5 (ref 5.0–8.0)

## 2019-10-06 LAB — ACETAMINOPHEN LEVEL: Acetaminophen (Tylenol), Serum: <10 ug/mL — ABNORMAL LOW (ref 10–30)

## 2019-10-06 LAB — ETHANOL: Alcohol, Ethyl (B): <10 mg/dL (ref <10)

## 2019-10-06 LAB — SALICYLATE LEVEL: Salicylate Lvl: <7 mg/dL — ABNORMAL LOW (ref 7.0–30.0)

## 2019-10-06 LAB — POCT PREGNANCY, URINE: Preg Test, Ur: NEGATIVE

## 2019-10-06 MED ORDER — DOXYCYCLINE HYCLATE 100 MG PO TABS
100.0000 mg | ORAL_TABLET | Freq: Two times a day (BID) | ORAL | Status: DC
  Administered 2019-10-06 – 2019-10-07 (×2): 100 mg via ORAL
  Filled 2019-10-06 (×3): qty 1

## 2019-10-06 MED ORDER — DOXYCYCLINE HYCLATE 100 MG PO TABS: 100.0000 mg | ORAL_TABLET | Freq: Two times a day (BID) | ORAL | Status: DC

## 2019-10-06 MED ORDER — ACETAMINOPHEN 500 MG PO TABS
1000.0000 mg | ORAL_TABLET | Freq: Once | ORAL | Status: AC
  Administered 2019-10-06: 20:00:00 1000 mg via ORAL
  Filled 2019-10-06: qty 2

## 2019-10-06 MED ORDER — IBUPROFEN 800 MG PO TABS
800.0000 mg | ORAL_TABLET | Freq: Once | ORAL | Status: AC
  Administered 2019-10-06: 800 mg via ORAL
  Filled 2019-10-06: qty 1

## 2019-10-06 MED ORDER — NITROFURANTOIN MONOHYD MACRO 100 MG PO CAPS
100.0000 mg | ORAL_CAPSULE | Freq: Once | ORAL | Status: AC
  Administered 2019-10-06: 100 mg via ORAL
  Filled 2019-10-06: qty 1

## 2019-10-06 MED ORDER — CEPHALEXIN 500 MG PO CAPS: 500.0000 mg | ORAL_CAPSULE | Freq: Three times a day (TID) | ORAL | Status: DC

## 2019-10-06 MED ORDER — CEFTRIAXONE SODIUM 1 G IJ SOLR
1.0000 g | Freq: Once | INTRAMUSCULAR | Status: AC
  Administered 2019-10-06: 04:00:00 1 g via INTRAMUSCULAR
  Filled 2019-10-06: qty 10

## 2019-10-06 MED ORDER — CEPHALEXIN 500 MG PO CAPS
500.0000 mg | ORAL_CAPSULE | Freq: Four times a day (QID) | ORAL | Status: DC
  Administered 2019-10-07 (×3): 500 mg via ORAL
  Filled 2019-10-06 (×3): qty 1

## 2019-10-06 MED ORDER — CEPHALEXIN 500 MG PO CAPS: 500.0000 mg | ORAL_CAPSULE | Freq: Three times a day (TID) | ORAL | 0 refills | Status: DC

## 2019-10-06 MED ORDER — DOXYCYCLINE HYCLATE 100 MG PO TABS: 100.0000 mg | ORAL_TABLET | Freq: Two times a day (BID) | ORAL | 0 refills | Status: DC

## 2019-10-06 MED ORDER — DOXYCYCLINE HYCLATE 100 MG PO TABS
100.0000 mg | ORAL_TABLET | Freq: Once | ORAL | Status: DC
  Administered 2019-10-06: 100 mg via ORAL
  Filled 2019-10-06: qty 1

## 2019-10-06 MED ORDER — CEPHALEXIN 500 MG PO CAPS
500.0000 mg | ORAL_CAPSULE | Freq: Three times a day (TID) | ORAL | Status: DC
  Administered 2019-10-06: 500 mg via ORAL
  Filled 2019-10-06: qty 1

## 2019-10-06 MED ORDER — LORAZEPAM 1 MG PO TABS
1.0000 mg | ORAL_TABLET | Freq: Once | ORAL | Status: AC
  Administered 2019-10-06: 1 mg via ORAL
  Filled 2019-10-06: qty 1

## 2019-10-06 NOTE — ED Notes (Signed)
Patient taken ginger ale
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
## 2019-10-06 NOTE — ED Triage Notes (Signed)
Pt back to the er after being seen last night. Pt under IVC for SI. [facility] and [facility]. Pt was here last night under influence of drugs and d/c this morning. Pt last used yesterday and appears to be having withdrawls.

## 2019-10-06 NOTE — ED Provider Notes (Signed)
[facility] Emergency Department Provider Note  ____________________________________________  Time seen: Approximately 2:57 AM  I have reviewed the triage vital signs and the nursing notes.   HISTORY  Chief Complaint Back Pain   HPI Lori Miller is a 39 y.o. female history of methamphetamine abuse, anxiety, depression, PTSD who presents for evaluation of back pain. Patient reports several weeks of foul-smelling urine.  Since earlier today she has had dull bilateral flank pain.  She has been using meth on a daily basis for about a year.  She is requesting help for detox.  She says that she has nowhere to go since her family is not available to her since she started using drugs.  She reports depression but denies SI or HI.  Last meth use was prior to arrival.  She is also complaining of a diffuse throbbing headache.  She denies IV drug use.   Past Medical History:  Diagnosis Date  . Anxiety   . Anxiety   . Depression   . Depression   . Migraine   . PTSD (post-traumatic stress disorder)     Patient Active Problem List   Diagnosis Date Noted  . Alcohol use disorder, moderate, dependence ([place]) 03/10/2018  . OCD (obsessive compulsive disorder) 03/10/2018  . PTSD (post-traumatic stress disorder) 03/10/2018  . Severe recurrent major depression without psychotic features ([place]) 03/08/2018  . Suicidal ideation 03/08/2018  . Self-inflicted injury 08/19/3233  . Tobacco use disorder 02/27/2017  . Benzodiazepine withdrawal ([place]) 02/27/2017  . Cannabis use disorder, severe, dependence ([place]) 02/27/2017  . Cocaine use disorder, moderate, dependence ([place]) 02/27/2017  . MDD (major depressive disorder), recurrent episode, moderate ([place]) 02/27/2017    Past Surgical History:  Procedure Laterality Date  . BUNIONECTOMY    . CYST EXCISION      Prior to Admission medications   Medication Sig Start Date End Date Taking? Authorizing Provider  fluvoxaMINE  (LUVOX) 100 MG tablet Take 1 tablet (100 mg total) by mouth at bedtime. 03/17/18   Pucilowska, Herma Ard B, MD  gabapentin (NEURONTIN) 300 MG capsule Take 1 capsule (300 mg total) by mouth 3 (three) times daily. 03/17/18   Pucilowska, Herma Ard B, MD  QUEtiapine (SEROQUEL) 25 MG tablet Take 1 tablet (25 mg total) by mouth 3 (three) times daily. 03/17/18   Pucilowska, Herma Ard B, MD  QUEtiapine (SEROQUEL) 300 MG tablet Take 1 tablet (300 mg total) by mouth at bedtime. 03/17/18   Pucilowska, Wardell Honour, MD    Allergies Flagyl [metronidazole]  No family history on file.  Social History Social History   Tobacco Use  . Smoking status: Current Every Day Smoker    Packs/day: 1.00    Types: Cigarettes  . Smokeless tobacco: Never Used  Substance Use Topics  . Alcohol use: Yes    Alcohol/week: 4.0 standard drinks    Types: 4 Shots of liquor per week  . Drug use: Yes    Frequency: 6.0 times per week    Types: Cocaine, Marijuana, Benzodiazepines    Comment: marijuana daily; crack daily x 3 months    Review of Systems  Constitutional: Negative for fever. Eyes: Negative for visual changes. ENT: Negative for sore throat. Neck: No neck pain  Cardiovascular: Negative for chest pain. Respiratory: Negative for shortness of breath. Gastrointestinal: Negative for abdominal pain, vomiting or diarrhea. Genitourinary: + dysuria and b/l flank pain Musculoskeletal: Negative for back pain. Skin: Negative for rash. Neurological: Negative for weakness or numbness. + HA Psych: No SI or HI  ____________________________________________   PHYSICAL EXAM:  VITAL SIGNS: Vitals:   10/06/19 0302 10/06/19 0306  BP: (!) 140/95   Pulse: 73   Resp: 18   Temp:  98 F (36.7 C)  SpO2: 100%     Constitutional: Alert and oriented, crying.  HEENT:      Head: Normocephalic and atraumatic.         Eyes: Conjunctivae are normal. Sclera is non-icteric.       Mouth/Throat: Mucous membranes are moist.       Neck:  Supple with no signs of meningismus. Cardiovascular: Regular rate and rhythm. No murmurs, gallops, or rubs.  Respiratory: Normal respiratory effort. Lungs are clear to auscultation bilaterally.  Gastrointestinal: Soft, tender to palpation over the suprapubic region, and non distended with positive bowel sounds. No rebound or guarding. Genitourinary: bilateral CVA tenderness. Musculoskeletal: Nontender with normal range of motion in all extremities. No edema, cyanosis, or erythema of extremities. Neurologic: Normal speech and language. Face is symmetric. Moving all extremities.  Intact strength and sensation x4, normal gait, EOMI, PERRL Skin: Skin is warm, dry and intact. No rash noted. Psychiatric: Mood and affect are normal. Speech and behavior are normal.  ____________________________________________   LABS (all labs ordered are listed, but only abnormal results are displayed)  Labs Reviewed  CBC WITH DIFFERENTIAL/PLATELET - Abnormal; Notable for the following components:      Result Value   WBC 10.7 (*)    All other components within normal limits  COMPREHENSIVE METABOLIC PANEL - Abnormal; Notable for the following components:   Glucose, Bld 107 (*)    All other components within normal limits  URINALYSIS, COMPLETE (UACMP) WITH MICROSCOPIC - Abnormal; Notable for the following components:   Color, Urine AMBER (*)    APPearance HAZY (*)    Hgb urine dipstick SMALL (*)    Protein, ur 30 (*)    Nitrite POSITIVE (*)    Leukocytes,Ua TRACE (*)    Bacteria, UA RARE (*)    All other components within normal limits  URINE DRUG SCREEN, QUALITATIVE ([facility] ONLY) - Abnormal; Notable for the following components:   Amphetamines, Ur Screen POSITIVE (*)    Cannabinoid 50 Ng, Ur  POSITIVE (*)    Benzodiazepine, Ur Scrn POSITIVE (*)    All other components within normal limits  URINE CULTURE  POCT PREGNANCY, URINE   ____________________________________________  EKG  none    ____________________________________________  RADIOLOGY  I have personally reviewed the images performed during this visit and I agree with the Radiologist's read.   Interpretation by Radiologist:  CT Renal Stone Study  Result Date: 10/06/2019 CLINICAL DATA:  Back pain with odorous urine EXAM: CT ABDOMEN AND PELVIS WITHOUT CONTRAST TECHNIQUE: Multidetector CT imaging of the abdomen and pelvis was performed following the standard protocol without IV contrast. COMPARISON:  CT 10/31/2018 FINDINGS: Lower chest: Lung bases are clear. Normal heart size. No pericardial effusion. Hepatobiliary: Moderate pneumobilia throughout the anti dependent portions of the central left liver with postsurgical changes in the gallbladder fossa likely related to prior cholecystectomy and features of choledochojejunostomy better seen on comparison contrast enhanced studies. Pancreas: No gross pancreatic abnormality. No peripancreatic inflammation or ductal dilatation. Spleen: Normal in size without focal abnormality. Adrenals/Urinary Tract: Normal adrenal glands. Hypoattenuation of the kidneys, nonspecific. No visible or contour deforming renal lesions. No urolithiasis or hydronephrosis. No significant bladder wall thickening or perivesicular stranding is seen. Stomach/Bowel: There small-bowel surgical changes compatible with prior choledochojejunostomy. Additional jejunojejunostomy anastomosis noted in the left lower  quadrant. Anastomoses appear patent. No focal dilatation or wall thickening is seen. No colonic dilatation or wall thickening. Normal appendix in the right lower quadrant. No colonic dilatation or wall thickening. Vascular/Lymphatic: The aorta is normal caliber. Diffusely increased attenuation of the mesentery may reflect some mesenteric congestion or mild edema. Adenopathy difficult to assess in the absence of contrast media. No focally enlarged lymph nodes are visualized. Reproductive: Tampon noted within the  vaginal canal. Uterus is retroverted. Question a small amount of stranding and loss of discernible planes between the uterus and broad ligament as well as small amount of fluid in the posterior cul-de-sac. Other: Edematous change of the mesentery. Small volume free fluid in the pelvis. No free air. No bowel containing hernia. Musculoskeletal: No acute osseous abnormality or suspicious osseous lesion. Multilevel degenerative changes are present in the imaged portions of the spine. IMPRESSION: 1. Edematous change of the mesentery could reflect mesenteric congestion or edema, nonspecific. 2. Postsurgical changes from prior cholecystectomy and likely prior choledochojejunostomy with moderate pneumobilia. 3. Hypoattenuation of the kidneys is nonspecific but can be seen with pyelonephritis. Correlate with urinalysis. 4. Retroflexed uterus with some nonspecific stranding centered in the region of the broad ligaments and loss of discernible fat planes. Correlate for pelvic symptoms to exclude features of PID. Electronically Signed   By: Kreg Shropshire M.D.   On: 10/06/2019 03:45      ____________________________________________   PROCEDURES  Procedure(s) performed: None Procedures Critical Care performed:  None ____________________________________________   INITIAL IMPRESSION / ASSESSMENT AND PLAN / ED COURSE  39 y.o. female history of methamphetamine abuse, anxiety, depression, PTSD who presents for evaluation of dysuria now bilateral flank pain.  Also requesting to speak with a psychiatrist for help with detox.  Patient has been depressed but denies SI or HI.  Does not have a very good support system.  The stomach criteria for IVC.  UA is positive for UTI. CT renal pending to eval for obstructing stone.  She is otherwise afebrile with normal white count.  Flank pain possibly early pyelonephritis but no signs of sepsis. Will treat with keflex. Culture pending. HA in the setting of  Recent drug use.  No  thunderclap headache, otherwise neurologically intact, afebrile with no neck stiffness or meningeal signs.  Will treat with ibuprofen.  Clinical Course as of Oct 05 413  Thu Oct 06, 2019  0413 CT showing no kidney stones but consistent with pyelonephritis and PID. Patient given IM rocephin and will start keflex and doxycycline. She is medically cleared for psych eval.    [CV]    Clinical Course User Index [CV] Don Perking [place], MD      As part of my medical decision making, I reviewed the following data within the electronic MEDICAL RECORD NUMBER Nursing notes reviewed and incorporated, Labs reviewed , Old chart reviewed, Radiograph reviewed , Notes from prior ED visits and  Controlled Substance Database   Please note:  Patient was evaluated in Emergency Department today for the symptoms described in the history of present illness. Patient was evaluated in the context of the global COVID-19 pandemic, which necessitated consideration that the patient might be at risk for infection with the SARS-CoV-2 virus that causes COVID-19. Institutional protocols and algorithms that pertain to the evaluation of patients at risk for COVID-19 are in a state of rapid change based on information released by regulatory bodies including the CDC and federal and state organizations. These policies and algorithms were followed during the patient's care in the  ED.  Some ED evaluations and interventions may be delayed as a result of limited staffing during the pandemic.   ____________________________________________   FINAL CLINICAL IMPRESSION(S) / ED DIAGNOSES   Final diagnoses:  Pyelonephritis  PID (acute pelvic inflammatory disease)  Methamphetamine abuse (HCC)  Polysubstance abuse (HCC)      NEW MEDICATIONS STARTED DURING THIS VISIT:  ED Discharge Orders    None       Note:  This document was prepared using Dragon voice recognition software and may include unintentional dictation errors.      Don Perking, [place], MD 10/06/19 [phone]

## 2019-10-06 NOTE — ED Notes (Signed)
Pt stating she wants to wait to talk to this RN until MD will talk with her as well.

## 2019-10-06 NOTE — ED Notes (Addendum)
Patient given pad per request. Patient using cell phone while laying in bed crying. Patient denies needing anything. Patient denied SI/HI

## 2019-10-06 NOTE — ED Provider Notes (Signed)
[facility] Emergency Department Provider Note  ____________________________________________   First MD Initiated Contact with Patient 10/06/19 Bosie Helper     (approximate)  I have reviewed the triage vital signs and the nursing notes.  History  Chief Complaint Behavior Problem    HPI Lori Miller is a 39 y.o. female with history of substance use, anxiety, depression, PTSD who presents to the emergency department from [place] under IVC for suicidal ideation.  Per paperwork with patient, patient reported suicidal ideation with a plan of cutting herself, overdosing, or walking in front of traffic.  Last had these thoughts today.  Does report methamphetamine use, last use was yesterday.  She denies any other recent drug use.  She reports a mild headache and feeling shaky/jittery, which she attributes to withdrawing from methamphetamines.  Of note, patient was seen and evaluated here early this morning for back pain, urinary symptoms, and requesting detox assistance; she was discharged from the ED around noon.  Was seen by psychiatry, though at that time was only seeking assistance with detox and denied any SI or HI.  Was diagnosed with pyelonephritis and PID, discharged with Keflex and doxycycline.  She states she has not had a chance to pick up these prescriptions yet.   Past Medical Hx Past Medical History:  Diagnosis Date  . Anxiety   . Anxiety   . Depression   . Depression   . Migraine   . PTSD (post-traumatic stress disorder)     Problem List Patient Active Problem List   Diagnosis Date Noted  . Methamphetamine abuse ([place])   . Alcohol use disorder, moderate, dependence ([place]) 03/10/2018  . OCD (obsessive compulsive disorder) 03/10/2018  . PTSD (post-traumatic stress disorder) 03/10/2018  . Severe recurrent major depression without psychotic features ([place]) 03/08/2018  . Suicidal ideation 03/08/2018  . Self-inflicted injury 24/23/5361  . Tobacco  use disorder 02/27/2017  . Benzodiazepine withdrawal ([place]) 02/27/2017  . Cannabis use disorder, severe, dependence ([place]) 02/27/2017  . Cocaine use disorder, moderate, dependence ([place]) 02/27/2017  . MDD (major depressive disorder), recurrent episode, moderate ([place]) 02/27/2017    Past Surgical Hx Past Surgical History:  Procedure Laterality Date  . BUNIONECTOMY    . CYST EXCISION      Medications Prior to Admission medications   Medication Sig Start Date End Date Taking? Authorizing Provider  cephALEXin (KEFLEX) 500 MG capsule Take 1 capsule (500 mg total) by mouth 3 (three) times daily. 10/06/19   Harvest Dark, MD  doxycycline (VIBRA-TABS) 100 MG tablet Take 1 tablet (100 mg total) by mouth 2 (two) times daily. 10/06/19   Harvest Dark, MD  fluvoxaMINE (LUVOX) 100 MG tablet Take 1 tablet (100 mg total) by mouth at bedtime. 03/17/18   Pucilowska, Herma Ard B, MD  gabapentin (NEURONTIN) 300 MG capsule Take 1 capsule (300 mg total) by mouth 3 (three) times daily. 03/17/18   Pucilowska, Herma Ard B, MD  QUEtiapine (SEROQUEL) 25 MG tablet Take 1 tablet (25 mg total) by mouth 3 (three) times daily. 03/17/18   Pucilowska, Herma Ard B, MD  QUEtiapine (SEROQUEL) 300 MG tablet Take 1 tablet (300 mg total) by mouth at bedtime. 03/17/18   Pucilowska, Wardell Honour, MD    Allergies Flagyl [metronidazole]  Family Hx No family history on file.  Social Hx Social History   Tobacco Use  . Smoking status: Current Every Day Smoker    Packs/day: 1.00    Types: Cigarettes  . Smokeless tobacco: Never Used  Substance Use Topics  .  Alcohol use: Yes    Alcohol/week: 4.0 standard drinks    Types: 4 Shots of liquor per week  . Drug use: Yes    Frequency: 6.0 times per week    Types: Marijuana, Benzodiazepines, Methamphetamines    Comment: marijuana daily; crack daily x 3 months     Review of Systems  Constitutional: Negative for fever, chills. +jittery, detox from meth Eyes: Negative for visual  changes. ENT: Negative for sore throat. Cardiovascular: Negative for chest pain. Respiratory: Negative for shortness of breath. Gastrointestinal: Negative for nausea, vomiting.  Genitourinary: Negative for dysuria. Musculoskeletal: Negative for leg swelling. Skin: Negative for rash. Neurological: + for for headaches.   Physical Exam  Vital Signs: ED Triage Vitals [10/06/19 1828]  Enc Vitals Group     BP      Pulse      Resp      Temp      Temp src      SpO2      Weight 116 lb (52.6 kg)     Height 5\' 4"  (1.626 m)     Head Circumference      Peak Flow      Pain Score 9     Pain Loc      Pain Edu?      Excl. in GC?     Constitutional: Alert and oriented. Somewhat jittery. Appears older than stated age.  Head: Normocephalic. Atraumatic. Eyes: Conjunctivae clear. Sclera anicteric. Nose: No congestion. No rhinorrhea. Mouth/Throat: Mucous membranes are moist.  Neck: No stridor.   Cardiovascular: Normal rate. Extremities well perfused. Respiratory: Normal respiratory effort.   Gastrointestinal: Non-distended.  Musculoskeletal: No deformities. Neurologic:  Normal speech and language. No gross focal neurologic deficits are appreciated.  Skin: Skin is warm, dry and intact.  Psychiatric: Anxious, jittery. Reported SI.  EKG  N/A    Radiology  N/A   Procedures  Procedure(s) performed (including critical care):  Procedures   Initial Impression / Assessment and Plan / ED Course  39 y.o. female who presents to the ED under IVC from [facility] for suicidal ideation.  Will obtain basic screening labs and consult TTS. Will order Keflex and doxycyline for treatment of her pyelonephritis and PID. Given dose of Ativan to help with withdrawal symptoms as well.    Final Clinical Impression(s) / ED Diagnosis  Final diagnoses:  Involuntary commitment       Note:  This document was prepared using Dragon voice recognition software and may include unintentional dictation  errors.   39., MD 10/06/19 2002

## 2019-10-06 NOTE — Consult Note (Signed)
[facility] Psychiatry Consult   Reason for Consult: Methamphetamine intoxication  Referring Physician: Paduchowski Patient Identification: Lori Miller MRN:  [id number] Principal Diagnosis: <principal problem not specified> Diagnosis:  Active Problems:   * No active hospital problems. *   Total Time spent with patient: 30 minutes  Subjective:   Lori Miller is a 39 y.o. female patient presents to the ED for methamphetamine intoxication and UTI.  HPI:  39 y.o. female patient presents to the ED for methamphetamine intoxication and UTI.  Patient reported she had been using methamphetamines over the course of the last 24 hours.  Upon evaluation patient was somewhat tearful and distraught.  She stated that she has been making some bad decisions in her life including being involved with a man who continues to use methamphetamine despite the negative consequences it has on their lives.  Patient states that she is no longer interested in using methamphetamine because of all the bridges she has burned including with her family.  Patient at this time is interested in substance abuse help and is willing to accept resources for both inpatient and outpatient substance abuse care.  Patient also did mention that she was homeless and would be provided with information for shelter.  Regarding her psychiatric status patient was upset given the circumstances but did deny any suicidal or homicidal ideation.  She denied any mania or psychosis or psychotic symptoms.  She also denied any paranoia when sober.     Past Psychiatric History: Patient has a long history of methamphetamine abuse  Risk to Self:  No Risk to Others:  No Prior Inpatient Therapy:  No Prior Outpatient Therapy:  Yes  Past Medical History:  Past Medical History:  Diagnosis Date  . Anxiety   . Anxiety   . Depression   . Depression   . Migraine   . PTSD (post-traumatic stress disorder)     Past Surgical History:   Procedure Laterality Date  . BUNIONECTOMY    . CYST EXCISION     Family History: No family history on file. Family Psychiatric  History: Unknown Social History:  Social History   Substance and Sexual Activity  Alcohol Use Yes  . Alcohol/week: 4.0 standard drinks  . Types: 4 Shots of liquor per week     Social History   Substance and Sexual Activity  Drug Use Yes  . Frequency: 6.0 times per week  . Types: Cocaine, Marijuana, Benzodiazepines   Comment: marijuana daily; crack daily x 3 months    Social History   Socioeconomic History  . Marital status: Single    Spouse name: Not on file  . Number of children: Not on file  . Years of education: Not on file  . Highest education level: Not on file  Occupational History  . Not on file  Tobacco Use  . Smoking status: Current Every Day Smoker    Packs/day: 1.00    Types: Cigarettes  . Smokeless tobacco: Never Used  Substance and Sexual Activity  . Alcohol use: Yes    Alcohol/week: 4.0 standard drinks    Types: 4 Shots of liquor per week  . Drug use: Yes    Frequency: 6.0 times per week    Types: Cocaine, Marijuana, Benzodiazepines    Comment: marijuana daily; crack daily x 3 months  . Sexual activity: Yes  Other Topics Concern  . Not on file  Social History Narrative  . Not on file   Social Determinants of Health   Financial  Resource Strain:   . Difficulty of Paying Living Expenses: Not on file  Food Insecurity:   . Worried About Programme researcher, broadcasting/film/video in the Last Year: Not on file  . Ran Out of Food in the Last Year: Not on file  Transportation Needs:   . Lack of Transportation (Medical): Not on file  . Lack of Transportation (Non-Medical): Not on file  Physical Activity:   . Days of Exercise per Week: Not on file  . Minutes of Exercise per Session: Not on file  Stress:   . Feeling of Stress : Not on file  Social Connections:   . Frequency of Communication with Friends and Family: Not on file  . Frequency of  Social Gatherings with Friends and Family: Not on file  . Attends Religious Services: Not on file  . Active Member of Clubs or Organizations: Not on file  . Attends Banker Meetings: Not on file  . Marital Status: Not on file   Additional Social History:    Allergies:   Allergies  Allergen Reactions  . Flagyl [Metronidazole] Other (See Comments)    Reaction:  Abdominal pain    Labs:  Results for orders placed or performed during the hospital encounter of 10/06/19 (from the past 48 hour(s))  CBC with Differential     Status: Abnormal   Collection Time: 10/06/19 12:04 AM  Result Value Ref Range   WBC 10.7 (H) 4.0 - 10.5 K/uL   RBC 4.67 3.87 - 5.11 MIL/uL   Hemoglobin 13.7 12.0 - 15.0 g/dL   HCT 37.1 06.2 - 69.4 %   MCV 88.9 80.0 - 100.0 fL   MCH 29.3 26.0 - 34.0 pg   MCHC 33.0 30.0 - 36.0 g/dL   RDW 85.4 62.7 - 03.5 %   Platelets 279 150 - 400 K/uL   nRBC 0.0 0.0 - 0.2 %   Neutrophils Relative % 62 %   Neutro Abs 6.7 1.7 - 7.7 K/uL   Lymphocytes Relative 31 %   Lymphs Abs 3.3 0.7 - 4.0 K/uL   Monocytes Relative 5 %   Monocytes Absolute 0.5 0.1 - 1.0 K/uL   Eosinophils Relative 1 %   Eosinophils Absolute 0.1 0.0 - 0.5 K/uL   Basophils Relative 1 %   Basophils Absolute 0.1 0.0 - 0.1 K/uL   Immature Granulocytes 0 %   Abs Immature Granulocytes 0.04 0.00 - 0.07 K/uL    Comment: Performed at [facility], [address] [place], [place] [address]  Comprehensive metabolic panel     Status: Abnormal   Collection Time: 10/06/19 12:04 AM  Result Value Ref Range   Sodium 140 135 - 145 mmol/L   Potassium 3.5 3.5 - 5.1 mmol/L   Chloride 105 98 - 111 mmol/L   CO2 27 22 - 32 mmol/L   Glucose, Bld 107 (H) 70 - 99 mg/dL    Comment: Glucose reference range applies only to samples taken after fasting for at least 8 hours.   BUN 19 6 - 20 mg/dL   Creatinine, Ser 1.82 0.44 - 1.00 mg/dL   Calcium 9.5 8.9 - 99.3 mg/dL   Total Protein 7.5 6.5 - 8.1 g/dL    Albumin 4.8 3.5 - 5.0 g/dL   AST 16 15 - 41 U/L   ALT 22 0 - 44 U/L   Alkaline Phosphatase 65 38 - 126 U/L   Total Bilirubin 0.5 0.3 - 1.2 mg/dL   GFR calc non Af Amer >60 >60  mL/min   GFR calc Af Amer >60 >60 mL/min   Anion gap 8 5 - 15    Comment: Performed at [facility], [address] [place], [place] [address]  Urinalysis, Complete w Microscopic     Status: Abnormal   Collection Time: 10/06/19 12:04 AM  Result Value Ref Range   Color, Urine AMBER (A) YELLOW    Comment: BIOCHEMICALS MAY BE AFFECTED BY COLOR   APPearance HAZY (A) CLEAR   Specific Gravity, Urine 1.026 1.005 - 1.030   pH 5.0 5.0 - 8.0   Glucose, UA NEGATIVE NEGATIVE mg/dL   Hgb urine dipstick SMALL (A) NEGATIVE   Bilirubin Urine NEGATIVE NEGATIVE   Ketones, ur NEGATIVE NEGATIVE mg/dL   Protein, ur 30 (A) NEGATIVE mg/dL   Nitrite POSITIVE (A) NEGATIVE   Leukocytes,Ua TRACE (A) NEGATIVE   RBC / HPF 0-5 0 - 5 RBC/hpf   WBC, UA 21-50 0 - 5 WBC/hpf   Bacteria, UA RARE (A) NONE SEEN   Squamous Epithelial / LPF 0-5 0 - 5   Mucus PRESENT    Hyaline Casts, UA PRESENT     Comment: Performed at [facility], [address], [place], [place] [address]  Urine Drug Screen, Qualitative ([facility] only)     Status: Abnormal   Collection Time: 10/06/19 12:04 AM  Result Value Ref Range   Tricyclic, Ur Screen NONE DETECTED NONE DETECTED   Amphetamines, Ur Screen POSITIVE (A) NONE DETECTED   MDMA (Ecstasy)Ur Screen NONE DETECTED NONE DETECTED   Cocaine Metabolite,Ur [place] NONE DETECTED NONE DETECTED   Opiate, Ur Screen NONE DETECTED NONE DETECTED   Phencyclidine (PCP) Ur S NONE DETECTED NONE DETECTED   Cannabinoid 50 Ng, Ur [place] POSITIVE (A) NONE DETECTED   Barbiturates, Ur Screen NONE DETECTED NONE DETECTED   Benzodiazepine, Ur Scrn POSITIVE (A) NONE DETECTED   Methadone Scn, Ur NONE DETECTED NONE DETECTED    Comment: (NOTE) Tricyclics + metabolites, urine    Cutoff 1000 ng/mL Amphetamines + metabolites,  urine  Cutoff 1000 ng/mL MDMA (Ecstasy), urine              Cutoff 500 ng/mL Cocaine Metabolite, urine          Cutoff 300 ng/mL Opiate + metabolites, urine        Cutoff 300 ng/mL Phencyclidine (PCP), urine         Cutoff 25 ng/mL Cannabinoid, urine                 Cutoff 50 ng/mL Barbiturates + metabolites, urine  Cutoff 200 ng/mL Benzodiazepine, urine              Cutoff 200 ng/mL Methadone, urine                   Cutoff 300 ng/mL The urine drug screen provides only a preliminary, unconfirmed analytical test result and should not be used for non-medical purposes. Clinical consideration and professional judgment should be applied to any positive drug screen result due to possible interfering substances. A more specific alternate chemical method must be used in order to obtain a confirmed analytical result. Gas chromatography / mass spectrometry (GC/MS) is the preferred confirmat ory method. Performed at [facility], [address] [place], [place] [address]   Pregnancy, urine POC     Status: None   Collection Time: 10/06/19 12:28 AM  Result Value Ref Range   Preg Test, Ur NEGATIVE NEGATIVE    Comment:  THE SENSITIVITY OF THIS METHODOLOGY IS >24 mIU/mL     Current Facility-Administered Medications  Medication Dose Route Frequency Provider Last Rate Last Admin  . cephALEXin (KEFLEX) capsule 500 mg  500 mg Oral TID Don Perking, [place], MD      . doxycycline (VIBRA-TABS) tablet 100 mg  100 mg Oral BID Nita Sickle, MD       Current Outpatient Medications  Medication Sig Dispense Refill  . cephALEXin (KEFLEX) 500 MG capsule Take 1 capsule (500 mg total) by mouth 3 (three) times daily. 30 capsule 0  . doxycycline (VIBRA-TABS) 100 MG tablet Take 1 tablet (100 mg total) by mouth 2 (two) times daily. 20 tablet 0  . fluvoxaMINE (LUVOX) 100 MG tablet Take 1 tablet (100 mg total) by mouth at bedtime. 30 tablet 1  . gabapentin (NEURONTIN) 300 MG capsule Take 1  capsule (300 mg total) by mouth 3 (three) times daily. 90 capsule 1  . QUEtiapine (SEROQUEL) 25 MG tablet Take 1 tablet (25 mg total) by mouth 3 (three) times daily. 90 tablet 1  . QUEtiapine (SEROQUEL) 300 MG tablet Take 1 tablet (300 mg total) by mouth at bedtime. 30 tablet 1    Musculoskeletal: Strength & Muscle Tone: within normal limits Gait & Station: normal Patient leans: N/A  Psychiatric Specialty Exam: Physical Exam  Review of Systems  Blood pressure (!) 131/106, pulse (!) 114, temperature 98 F (36.7 C), temperature source Oral, resp. rate 16, height 5\' 4"  (1.626 m), weight 52.6 kg, last menstrual period 10/05/2019, SpO2 100 %.Body mass index is 19.91 kg/m.  General Appearance: Casual  Eye Contact:  Good  Speech:  Clear and Coherent and Normal Rate  Volume:  Normal  Mood:  Euthymic  Affect:  Appropriate and Congruent  Thought Process:  Coherent  Orientation:  Full (Time, Place, and Person)  Thought Content:  Logical  Suicidal Thoughts:  No  Homicidal Thoughts:  No  Memory:  Recent;   Good  Judgement:  Fair  Insight:  Fair  Psychomotor Activity:  Normal  Concentration:  Concentration: Fair  Recall:  Fair  Fund of Knowledge:  Fair  Language:  Good  Akathisia:  No  Handed:  Right  AIMS (if indicated):     Assets:  Communication Skills Desire for Improvement Resilience  ADL's:  Intact  Cognition:  WNL  Sleep:        Treatment Plan Summary: Patient has a long history of methamphetamine abuse.  Provided with outpatient resources.  Does not meet criteria for inpatient psychiatric hospitalization.  Disposition: No evidence of imminent risk to self or others at present.   Patient does not meet criteria for psychiatric inpatient admission. Supportive therapy provided about ongoing stressors.  10/07/2019, MD 10/06/2019 11:33 AM

## 2019-10-06 NOTE — ED Notes (Signed)
ED Provider at bedside. 

## 2019-10-06 NOTE — BH Assessment (Signed)
Writer provided the patient with both inpatient and outpatient referral information.

## 2019-10-06 NOTE — ED Notes (Signed)
Patient given breakfast tray.

## 2019-10-06 NOTE — ED Notes (Addendum)
TTS and psych MD at bedside.  

## 2019-10-06 NOTE — ED Notes (Signed)
Pt changed into behavorial clothing by this tech and RN Sherrie, the following belongings were obtained....   1 grey & pink hoodie, 1 black pair of jeans, 1 teal pair of underwear, 1 pair of black socks, 1 paid of black shoes, 1 navy blue hair bow, 1 clear colored earring, 1 black plastic necklace 1 black pocketbook, 1 black and purple duffle bag  Items placed at [employer]

## 2019-10-06 NOTE — Consult Note (Signed)
[facility] Face-to-Face Psychiatry Consult   Reason for Consult: Behavior Problems Referring Physician: Monks Patient Identification: Lori Miller MRN:  [id number] Principal Diagnosis: <principal problem not specified> Diagnosis:  Active Problems:   Tobacco use disorder   Benzodiazepine withdrawal (HCC)   Cannabis use disorder, severe, dependence (HCC)   Cocaine use disorder, moderate, dependence (HCC)   MDD (major depressive disorder), recurrent episode, moderate (HCC)   Severe recurrent major depression without psychotic features (HCC)   Suicidal ideation   Self-inflicted injury   Alcohol use disorder, moderate, dependence (HCC)   OCD (obsessive compulsive disorder)   PTSD (post-traumatic stress disorder)   Methamphetamine abuse (HCC)   Total Time spent with patient: 30 minutes  Subjective: "I had a knife I was going to cut my wrists but I decided not to not call for help." Lori Miller is a 39 y.o. female patient presented to [facility] ED via law enforcement under involuntary commitment status by way of [facility]. The patient states that her boyfriend was arrested and is in jail.  She says she was experiencing some suicidal thoughts, got a hold of a knife, and wanted to cut her wrist.  The patient states she stopped and called 911 for help.  She voiced that suicidal ideation is passive during her assessment, and she says she agrees to detox services.  The patient was seen face-to-face by this provider; chart reviewed and consulted with Dr. Colon Branch on 10/06/2019 due to the patient's care. It was discussed with the EDP that the patient would remain. The patient will remain under observation overnight and reassess in the a.m. to determine if she meets the criteria for psychiatric inpatient admission or could be discharged for outpatient detox services.  The patient is alert and oriented x 4, tearful, shaking, cooperative, and mood-congruent with affect on evaluation. The patient does not  appear to be responding to internal or external stimuli. Neither is the patient presenting with any delusional thinking. The patient denies auditory or visual hallucinations. The patient admits to suicidal ideation without a plan. She denies homicidal or self-harm ideations. The patient is not presenting with any psychotic or paranoid behaviors. During an encounter with the patient, she was able to answer questions appropriately.  Plan: The patient will remain under observation overnight and reassess in the a.m. to determine if she meets the criteria for psychiatric inpatient admission or could be discharged for outpatient detox services.  HPI:  Per Dr. Colon Branch: Lori Miller is a 39 y.o. female with history of substance use, anxiety, depression, PTSD who presents to the emergency department from [facility] under IVC for suicidal ideation.  Per paperwork with patient, patient reported suicidal ideation with a plan of cutting herself, overdosing, or walking in front of traffic.  Last had these thoughts today.  Does report methamphetamine use, last use was yesterday.  She denies any other recent drug use.  She reports a mild headache and feeling shaky/jittery, which she attributes to withdrawing from methamphetamines.  Of note, patient was seen and evaluated here early this morning for back pain, urinary symptoms, and requesting detox assistance; she was discharged from the ED around noon.  Was seen by psychiatry, though at that time was only seeking assistance with detox and denied any SI or HI.  Was diagnosed with pyelonephritis and PID, discharged with Keflex and doxycycline.  She states she has not had a chance to pick up these prescriptions yet.  Past Psychiatric History: Patient has a long history of methamphetamine abuse  Risk to Self: Suicidal Ideation: No-Not Currently/Within Last 6 Months Suicidal Intent: No-Not Currently/Within Last 6 Months Is patient at risk for suicide?: No Suicidal  Plan?: No-Not Currently/Within Last 6 Months Access to Means: Yes Specify Access to Suicidal Means: Patient reported wanting to walk in front of traffic, use a knife to cut herself What has been your use of drugs/alcohol within the last 12 months?: Methamphetamines How many times?: 0 Triggers for Past Attempts: None known Intentional Self Injurious Behavior: NoneNo Risk to Others: Homicidal Ideation: No Thoughts of Harm to Others: No Current Homicidal Intent: No Current Homicidal Plan: No Access to Homicidal Means: No History of harm to others?: No Assessment of Violence: None Noted Does patient have access to weapons?: No Criminal Charges Pending?: No Does patient have a court date: NoNo Prior Inpatient Therapy: Prior Inpatient Therapy: Yes Prior Therapy Dates: 03/08/2018 Prior Therapy Facilty/Provider(s): [facility] Reason for Treatment: DepressionNo Prior Outpatient Therapy: Prior Outpatient Therapy: Yes Prior Therapy Dates: Unknown Prior Therapy Facilty/Provider(s): [facility] Reason for Treatment: Suboxone Treatment Does patient have an ACCT team?: No Does patient have Intensive In-House Services?  : No Does patient have [facility] services? : No Does patient have P4CC services?: NoYes  Past Medical History:  Past Medical History:  Diagnosis Date  . Anxiety   . Anxiety   . Depression   . Depression   . Migraine   . PTSD (post-traumatic stress disorder)     Past Surgical History:  Procedure Laterality Date  . BUNIONECTOMY    . CYST EXCISION     Family History: No family history on file. Family Psychiatric  History: Unknown Social History:  Social History   Substance and Sexual Activity  Alcohol Use Yes  . Alcohol/week: 4.0 standard drinks  . Types: 4 Shots of liquor per week     Social History   Substance and Sexual Activity  Drug Use Yes  . Frequency: 6.0 times per week  . Types: Marijuana, Benzodiazepines, Methamphetamines   Comment:  marijuana daily; crack daily x 3 months    Social History   Socioeconomic History  . Marital status: Single    Spouse name: Not on file  . Number of children: Not on file  . Years of education: Not on file  . Highest education level: Not on file  Occupational History  . Not on file  Tobacco Use  . Smoking status: Current Every Day Smoker    Packs/day: 1.00    Types: Cigarettes  . Smokeless tobacco: Never Used  Substance and Sexual Activity  . Alcohol use: Yes    Alcohol/week: 4.0 standard drinks    Types: 4 Shots of liquor per week  . Drug use: Yes    Frequency: 6.0 times per week    Types: Marijuana, Benzodiazepines, Methamphetamines    Comment: marijuana daily; crack daily x 3 months  . Sexual activity: Yes  Other Topics Concern  . Not on file  Social History Narrative  . Not on file   Social Determinants of Health   Financial Resource Strain:   . Difficulty of Paying Living Expenses: Not on file  Food Insecurity:   . Worried About Charity fundraiser in the Last Year: Not on file  . Ran Out of Food in the Last Year: Not on file  Transportation Needs:   . Lack of Transportation (Medical): Not on file  . Lack of Transportation (Non-Medical): Not on file  Physical Activity:   . Days of Exercise per  Week: Not on file  . Minutes of Exercise per Session: Not on file  Stress:   . Feeling of Stress : Not on file  Social Connections:   . Frequency of Communication with Friends and Family: Not on file  . Frequency of Social Gatherings with Friends and Family: Not on file  . Attends Religious Services: Not on file  . Active Member of Clubs or Organizations: Not on file  . Attends Banker Meetings: Not on file  . Marital Status: Not on file   Additional Social History:    Allergies:   Allergies  Allergen Reactions  . Flagyl [Metronidazole] Other (See Comments)    Reaction:  Abdominal pain    Labs:  Results for orders placed or performed during the  hospital encounter of 10/06/19 (from the past 48 hour(s))  Comprehensive metabolic panel     Status: Abnormal   Collection Time: 10/06/19  6:33 PM  Result Value Ref Range   Sodium 139 135 - 145 mmol/L   Potassium 4.3 3.5 - 5.1 mmol/L   Chloride 105 98 - 111 mmol/L   CO2 29 22 - 32 mmol/L   Glucose, Bld 102 (H) 70 - 99 mg/dL    Comment: Glucose reference range applies only to samples taken after fasting for at least 8 hours.   BUN 19 6 - 20 mg/dL   Creatinine, Ser 9.38 0.44 - 1.00 mg/dL   Calcium 9.2 8.9 - 18.2 mg/dL   Total Protein 7.1 6.5 - 8.1 g/dL   Albumin 4.4 3.5 - 5.0 g/dL   AST 20 15 - 41 U/L   ALT 25 0 - 44 U/L   Alkaline Phosphatase 68 38 - 126 U/L   Total Bilirubin 0.4 0.3 - 1.2 mg/dL   GFR calc non Af Amer >60 >60 mL/min   GFR calc Af Amer >60 >60 mL/min   Anion gap 5 5 - 15    Comment: Performed at [facility], [address], [place], [place] [address]  Ethanol     Status: None   Collection Time: 10/06/19  6:33 PM  Result Value Ref Range   Alcohol, Ethyl (B) <10 <10 mg/dL    Comment: (NOTE) Lowest detectable limit for serum alcohol is 10 mg/dL. For medical purposes only. Performed at [facility], [address] [place], [place] [address]   Salicylate level     Status: Abnormal   Collection Time: 10/06/19  6:33 PM  Result Value Ref Range   Salicylate Lvl <7.0 (L) 7.0 - 30.0 mg/dL    Comment: Performed at [facility], [address] [place], [place] [address]  Acetaminophen level     Status: Abnormal   Collection Time: 10/06/19  6:33 PM  Result Value Ref Range   Acetaminophen (Tylenol), Serum <10 (L) 10 - 30 ug/mL    Comment: (NOTE) Therapeutic concentrations vary significantly. A range of 10-30 ug/mL  may be an effective concentration for many patients. However, some  are best treated at concentrations outside of this range. Acetaminophen concentrations >150 ug/mL at 4 hours after ingestion  and >50 ug/mL at 12 hours after  ingestion are often associated with  toxic reactions. Performed at [facility], [address] [place], [place] [address]   cbc     Status: None   Collection Time: 10/06/19  6:33 PM  Result Value Ref Range   WBC 7.9 4.0 - 10.5 K/uL   RBC 4.49 3.87 - 5.11 MIL/uL  Hemoglobin 13.3 12.0 - 15.0 g/dL   HCT 25.6 38.9 - 37.3 %   MCV 89.1 80.0 - 100.0 fL   MCH 29.6 26.0 - 34.0 pg   MCHC 33.3 30.0 - 36.0 g/dL   RDW 42.8 76.8 - 11.5 %   Platelets 293 150 - 400 K/uL   nRBC 0.0 0.0 - 0.2 %    Comment: Performed at [facility], [address], [place], [place] [address]    Current Facility-Administered Medications  Medication Dose Route Frequency Provider Last Rate Last Admin  . [START ON 10/07/2019] cephALEXin (KEFLEX) capsule 500 mg  500 mg Oral 4x daily Miguel Aschoff., MD      . doxycycline (VIBRA-TABS) tablet 100 mg  100 mg Oral BID Miguel Aschoff., MD   100 mg at 10/06/19 1954   Current Outpatient Medications  Medication Sig Dispense Refill  . cephALEXin (KEFLEX) 500 MG capsule Take 1 capsule (500 mg total) by mouth 3 (three) times daily. 30 capsule 0  . doxycycline (VIBRA-TABS) 100 MG tablet Take 1 tablet (100 mg total) by mouth 2 (two) times daily. 20 tablet 0  . fluvoxaMINE (LUVOX) 100 MG tablet Take 1 tablet (100 mg total) by mouth at bedtime. 30 tablet 1  . gabapentin (NEURONTIN) 300 MG capsule Take 1 capsule (300 mg total) by mouth 3 (three) times daily. 90 capsule 1  . QUEtiapine (SEROQUEL) 25 MG tablet Take 1 tablet (25 mg total) by mouth 3 (three) times daily. 90 tablet 1  . QUEtiapine (SEROQUEL) 300 MG tablet Take 1 tablet (300 mg total) by mouth at bedtime. 30 tablet 1    Musculoskeletal: Strength & Muscle Tone: within normal limits Gait & Station: normal Patient leans: N/A  Psychiatric Specialty Exam: Physical Exam  Nursing note and vitals reviewed. Constitutional: She is oriented to person, place, and time.  Cardiovascular: Normal rate.   Respiratory: Effort normal.  Musculoskeletal:        General: Normal range of motion.     Cervical back: Normal range of motion and neck supple.  Neurological: She is alert and oriented to person, place, and time.  Psychiatric: Her behavior is normal.    Review of Systems  Psychiatric/Behavioral: Positive for sleep disturbance and suicidal ideas. The patient is nervous/anxious.   All other systems reviewed and are negative.   Blood pressure 113/76, pulse 80, temperature 98.3 F (36.8 C), temperature source Oral, resp. rate 20, height 5\' 4"  (1.626 m), weight 52.6 kg, last menstrual period 10/05/2019, SpO2 99 %.Body mass index is 19.91 kg/m.  General Appearance: Casual  Eye Contact:  Good  Speech:  Clear and Coherent and Normal Rate  Volume:  Normal  Mood:  Euthymic  Affect:  Appropriate and Congruent  Thought Process:  Coherent  Orientation:  Full (Time, Place, and Person)  Thought Content:  Logical  Suicidal Thoughts:  Yes.  without intent/plan  Homicidal Thoughts:  No  Memory:  Recent;   Good  Judgement:  Fair  Insight:  Fair  Psychomotor Activity:  Normal  Concentration:  Concentration: Fair  Recall:  Fair  Fund of Knowledge:  Fair  Language:  Good  Akathisia:  No  Handed:  Right  AIMS (if indicated):     Assets:  Communication Skills Desire for Improvement Resilience  ADL's:  Intact  Cognition:  WNL  Sleep:        Treatment Plan Summary: Patient has a long history of methamphetamine abuse.  The patient will remain under observation overnight  and reassess in the a.m. to determine if she meets criteria for psychiatric inpatient admission or could be discharged for outpatient detox services. Disposition: Supportive therapy provided about ongoing stressors. The patient will remain under observation overnight and reassess in the a.m. to determine if she meets criteria for psychiatric inpatient admission or could be discharged for outpatient detox  services.  Gillermo Murdoch, NP 10/06/2019 9:55 PM

## 2019-10-06 NOTE — ED Provider Notes (Signed)
-----------------------------------------   11:03 AM on 10/06/2019 -----------------------------------------  Patient has been seen and evaluated by psychiatry.  They believe the patient is safe for discharge home from a psychiatric standpoint.  From medical standpoint patient will be treated with 7 days of Keflex and 10 days of doxycycline.   Minna Antis, MD 10/06/19 [phone]

## 2019-10-07 ENCOUNTER — Other Ambulatory Visit: Payer: Self-pay

## 2019-10-07 ENCOUNTER — Inpatient Hospital Stay
Admission: RE | Admit: 2019-10-07 | Discharge: 2019-10-13 | DRG: 885 | Disposition: A | Discharge status: Home / Self Care | Payer: No Typology Code available for payment source | Source: Intra-hospital | Attending: Psychiatry | Admitting: Psychiatry

## 2019-10-07 ENCOUNTER — Encounter: Payer: Self-pay | Admitting: Psychiatry

## 2019-10-07 DIAGNOSIS — Z79899 Other long term (current) drug therapy: Secondary | ICD-10-CM | POA: Diagnosis not present

## 2019-10-07 DIAGNOSIS — F1510 Other stimulant abuse, uncomplicated: Secondary | ICD-10-CM | POA: Diagnosis present

## 2019-10-07 DIAGNOSIS — F17210 Nicotine dependence, cigarettes, uncomplicated: Secondary | ICD-10-CM | POA: Diagnosis present

## 2019-10-07 DIAGNOSIS — Z814 Family history of other substance abuse and dependence: Secondary | ICD-10-CM | POA: Diagnosis not present

## 2019-10-07 DIAGNOSIS — Z20822 Contact with and (suspected) exposure to covid-19: Secondary | ICD-10-CM | POA: Diagnosis present

## 2019-10-07 DIAGNOSIS — N390 Urinary tract infection, site not specified: Secondary | ICD-10-CM | POA: Diagnosis present

## 2019-10-07 DIAGNOSIS — Z590 Homelessness: Secondary | ICD-10-CM

## 2019-10-07 DIAGNOSIS — K5900 Constipation, unspecified: Secondary | ICD-10-CM | POA: Diagnosis present

## 2019-10-07 DIAGNOSIS — G3184 Mild cognitive impairment, so stated: Secondary | ICD-10-CM | POA: Diagnosis present

## 2019-10-07 DIAGNOSIS — F332 Major depressive disorder, recurrent severe without psychotic features: Principal | ICD-10-CM | POA: Diagnosis present

## 2019-10-07 DIAGNOSIS — F4310 Post-traumatic stress disorder, unspecified: Secondary | ICD-10-CM | POA: Diagnosis present

## 2019-10-07 DIAGNOSIS — R45851 Suicidal ideations: Secondary | ICD-10-CM | POA: Diagnosis present

## 2019-10-07 LAB — RESPIRATORY PANEL BY RT PCR (FLU A&B, COVID)
Influenza A by PCR: NEGATIVE
Influenza B by PCR: NEGATIVE
SARS Coronavirus 2 by RT PCR: NEGATIVE

## 2019-10-07 LAB — POCT PREGNANCY, URINE: Preg Test, Ur: NEGATIVE

## 2019-10-07 MED ORDER — ALUM & MAG HYDROXIDE-SIMETH 200-200-20 MG/5ML PO SUSP: 30.0000 mL | ORAL | Status: DC | PRN

## 2019-10-07 MED ORDER — ALUM & MAG HYDROXIDE-SIMETH 200-200-20 MG/5ML PO SUSP
30.0000 mL | ORAL | Status: DC | PRN
  Administered 2019-10-08 – 2019-10-12 (×3): 30 mL via ORAL
  Filled 2019-10-07 (×3): qty 30

## 2019-10-07 MED ORDER — ACETAMINOPHEN 325 MG PO TABS: 650.0000 mg | ORAL_TABLET | Freq: Four times a day (QID) | ORAL | Status: DC | PRN

## 2019-10-07 MED ORDER — GABAPENTIN 600 MG PO TABS
300.0000 mg | ORAL_TABLET | Freq: Three times a day (TID) | ORAL | Status: DC
  Administered 2019-10-07 – 2019-10-11 (×13): 300 mg via ORAL
  Filled 2019-10-07 (×13): qty 1

## 2019-10-07 MED ORDER — ACETAMINOPHEN 325 MG PO TABS
650.0000 mg | ORAL_TABLET | Freq: Four times a day (QID) | ORAL | Status: DC | PRN
  Administered 2019-10-08 – 2019-10-12 (×3): 650 mg via ORAL
  Filled 2019-10-07 (×3): qty 2

## 2019-10-07 MED ORDER — MAGNESIUM HYDROXIDE 400 MG/5ML PO SUSP: 30.0000 mL | Freq: Every day | ORAL | Status: DC | PRN

## 2019-10-07 MED ORDER — HYDROXYZINE HCL 25 MG PO TABS
25.0000 mg | ORAL_TABLET | Freq: Three times a day (TID) | ORAL | Status: DC | PRN
  Administered 2019-10-07 – 2019-10-08 (×3): 25 mg via ORAL
  Filled 2019-10-07 (×3): qty 1

## 2019-10-07 MED ORDER — MAGNESIUM HYDROXIDE 400 MG/5ML PO SUSP
30.0000 mL | Freq: Every day | ORAL | Status: DC | PRN
  Administered 2019-10-08: 30 mL via ORAL
  Filled 2019-10-07: qty 30

## 2019-10-07 MED ORDER — DIAZEPAM 5 MG PO TABS
10.0000 mg | ORAL_TABLET | Freq: Once | ORAL | Status: AC
  Administered 2019-10-07: 10 mg via ORAL
  Filled 2019-10-07: qty 2

## 2019-10-07 MED ORDER — TRAZODONE HCL 50 MG PO TABS
50.0000 mg | ORAL_TABLET | Freq: Every evening | ORAL | Status: DC | PRN
  Administered 2019-10-07 – 2019-10-12 (×6): 50 mg via ORAL
  Filled 2019-10-07 (×6): qty 1

## 2019-10-07 NOTE — Progress Notes (Signed)
Patient was in her room upon arrival to the unit. Patient was pleasant during assessment denying SI/HI/AVH, pain. Patient endorses anxiety (7/10) and depression (5/10) Patient complaint with medication administration per MD orders. Pt given education, encouragement to be active in her treatment plan. Pt being monitored Q 15 minutes for safety per unit protocol. Patient remains safe on the unit.

## 2019-10-07 NOTE — Plan of Care (Signed)
Pt new today, hasn't had time to progress.   Problem: Education: Goal: Knowledge of [facility] Education information/materials will improve Outcome: Not Progressing Goal: Emotional status will improve Outcome: Not Progressing Goal: Mental status will improve Outcome: Not Progressing Goal: Verbalization of understanding the information provided will improve Outcome: Not Progressing   Problem: Safety: Goal: Periods of time without injury will increase Outcome: Not Progressing   Problem: Physical Regulation: Goal: Complications related to the disease process, condition or treatment will be avoided or minimized Outcome: Not Progressing

## 2019-10-07 NOTE — ED Notes (Addendum)
Pt up to bathroom.  Ate all breakfast.

## 2019-10-07 NOTE — ED Notes (Signed)
Pt given breakfast tray.  Eating tray.

## 2019-10-07 NOTE — [facility] Group Notes (Signed)
[facility] Group Notes:  (Nursing/MHT/Case Management/Adjunct)  Date:  10/07/2019  Time:  11:30 PM  Type of Therapy:  Wrap-up Group  Participation Level:  Active  Participation Quality:  Appropriate  Affect:  Appropriate  Cognitive:  Alert and Appropriate  Insight:  Appropriate and Good  Engagement in Group:  Engaged  Modes of Intervention:  Clarification  Summary of Progress/Problems:  Jo-Anne Kluth 10/07/2019, 11:30 PM 

## 2019-10-07 NOTE — ED Notes (Signed)
Patient's belongings include two bags and a duffel bag were retrieved from [facility] closet and sent downstairs with patient. Waiting for security.

## 2019-10-07 NOTE — ED Notes (Signed)
Pt given juice and updated on plan of care. No other needs.

## 2019-10-07 NOTE — ED Notes (Signed)
Psych at bedside for consult.

## 2019-10-07 NOTE — BH Assessment (Signed)
Writer spoke with the patient to complete an updated/reassessment.   Patient continues and to voice SI   Patient meets inpatient criteria.

## 2019-10-07 NOTE — Progress Notes (Signed)
Patient admitted for suicidal ideation and Amphetamine abuse.Patient is  sad and tearful but  cooperative during admission assessment. Patient verbalized passive suicidal thoughts,contracts for safety.Patient is positive for seeing shadows and hearing voices. Patient informed of fall risk status, fall risk assessed "low" at this time. Patient oriented to unit/staff/room. Patient denies any questions/concerns at this time. Patient safe on unit with Q15 minute checks for safety. Skin assessment and body search done,no contraband found.

## 2019-10-07 NOTE — Consult Note (Signed)
  Patient continues to endorse suicidal ideation this morning.  She is fearful that if she leaves the hospital she may do something to harm herself.  Patient endorsing plan to cut herself with wrist at this time.  Patient also states that she is interested in potentially seeking outpatient resources but feels too anxious to do so.  We will proceed with admission, patient continues to be under IVC

## 2019-10-07 NOTE — Plan of Care (Signed)
New admission.  Problem: Education: Goal: Knowledge of [facility] Education information/materials will improve Outcome: Not Progressing Goal: Emotional status will improve Outcome: Not Progressing Goal: Mental status will improve Outcome: Not Progressing Goal: Verbalization of understanding the information provided will improve Outcome: Not Progressing   Problem: Safety: Goal: Periods of time without injury will increase Outcome: Not Progressing   Problem: Physical Regulation: Goal: Complications related to the disease process, condition or treatment will be avoided or minimized Outcome: Not Progressing   Problem: Self-Concept: Goal: Ability to disclose and discuss suicidal ideas will improve Outcome: Not Progressing   Problem: Coping: Goal: Coping ability will improve Outcome: Not Progressing

## 2019-10-07 NOTE — ED Notes (Signed)
Pt in shower. Given toothbrush/toothpaste

## 2019-10-07 NOTE — BH Assessment (Signed)
Patient is to be admitted to [facility] by Dr. Harvin Hazel.  Attending Physician will be Dr. Toni Amend.   Patient has been assigned to room 311, by [facility] Charge Nurse Gigi.   ER staff is aware of the admission:  Misty Stanley, ER Secretary    Dr. Roxan Hockey, ER MD   Victorino Dike, Patient's Nurse   Donella Stade.,  Patient Access.

## 2019-10-07 NOTE — Tx Team (Signed)
Initial Treatment Plan 10/07/2019 5:12 PM Maud Rubendall MOQ:[id number]    PATIENT STRESSORS: Financial difficulties Medication change or noncompliance Substance abuse   PATIENT STRENGTHS: General fund of knowledge Motivation for treatment/growth   PATIENT IDENTIFIED PROBLEMS: SI  Substance abuse  Depression                 DISCHARGE CRITERIA:  Ability to meet basic life and health needs Improved stabilization in mood, thinking, and/or behavior Need for constant or close observation no longer present Reduction of life-threatening or endangering symptoms to within safe limits  PRELIMINARY DISCHARGE PLAN: Outpatient therapy Placement in alternative living arrangements  PATIENT/FAMILY INVOLVEMENT: This treatment plan has been presented to and reviewed with the patient, Taneasha Fuqua. The patient has been given the opportunity to ask questions and make suggestions.  Belisa Eichholz, RN 10/07/2019, 5:12 PM

## 2019-10-08 DIAGNOSIS — F332 Major depressive disorder, recurrent severe without psychotic features: Principal | ICD-10-CM

## 2019-10-08 DIAGNOSIS — N390 Urinary tract infection, site not specified: Secondary | ICD-10-CM

## 2019-10-08 LAB — URINE CULTURE: Culture: >=100000 — AB

## 2019-10-08 MED ORDER — HYDROXYZINE HCL 50 MG PO TABS
50.0000 mg | ORAL_TABLET | Freq: Three times a day (TID) | ORAL | Status: DC | PRN
  Administered 2019-10-08 – 2019-10-13 (×12): 50 mg via ORAL
  Filled 2019-10-08 (×12): qty 1

## 2019-10-08 MED ORDER — FLUOXETINE HCL 20 MG PO CAPS
20.0000 mg | ORAL_CAPSULE | Freq: Every day | ORAL | Status: DC
  Administered 2019-10-08 – 2019-10-13 (×6): 20 mg via ORAL
  Filled 2019-10-08 (×6): qty 1

## 2019-10-08 MED ORDER — PANTOPRAZOLE SODIUM 40 MG PO TBEC
40.0000 mg | DELAYED_RELEASE_TABLET | Freq: Every day | ORAL | Status: DC
  Administered 2019-10-08 – 2019-10-13 (×6): 40 mg via ORAL
  Filled 2019-10-08 (×6): qty 1

## 2019-10-08 MED ORDER — QUETIAPINE FUMARATE 25 MG PO TABS
50.0000 mg | ORAL_TABLET | Freq: Four times a day (QID) | ORAL | Status: DC | PRN
  Administered 2019-10-08 – 2019-10-13 (×9): 50 mg via ORAL
  Filled 2019-10-08 (×8): qty 2

## 2019-10-08 MED ORDER — IBUPROFEN 600 MG PO TABS
600.0000 mg | ORAL_TABLET | Freq: Four times a day (QID) | ORAL | Status: DC | PRN
  Administered 2019-10-09 (×2): 600 mg via ORAL
  Filled 2019-10-08 (×2): qty 1

## 2019-10-08 MED ORDER — QUETIAPINE FUMARATE 100 MG PO TABS
100.0000 mg | ORAL_TABLET | Freq: Every day | ORAL | Status: DC
  Administered 2019-10-08 – 2019-10-12 (×5): 100 mg via ORAL
  Filled 2019-10-08 (×5): qty 1

## 2019-10-08 MED ORDER — CEPHALEXIN 500 MG PO CAPS
500.0000 mg | ORAL_CAPSULE | Freq: Four times a day (QID) | ORAL | Status: DC
  Administered 2019-10-08 – 2019-10-13 (×18): 500 mg via ORAL
  Filled 2019-10-08 (×18): qty 1

## 2019-10-08 NOTE — [facility] Group Notes (Signed)
[facility] Group Notes:  (Nursing/MHT/Case Management/Adjunct)  Date:  10/08/2019  Time:  11:46 AM  Type of Therapy:  Psychoeducational Skills  Participation Level:  Did Not Attend   Summary of Progress/Problems:  Kerrie Pleasure 10/08/2019, 11:46 AM

## 2019-10-08 NOTE — [facility] Suicide Risk Assessment (Signed)
[facility] INPATIENT:  Family/Significant Other Suicide Prevention Education  Suicide Prevention Education:  Patient Refusal for Family/Significant Other Suicide Prevention Education: The patient Lori Miller has refused to provide written consent for family/significant other to be provided Family/Significant Other Suicide Prevention Education during admission and/or prior to discharge.  Physician notified.   CSW discussed suicide education prevention with the patient. CSW provided an informational brochure and contact information for future assistance. CSW gave the pt an opportunity to ask questions. Pt did not have any.   Leahna Hewson P Dreyden Rohrman, LCSWA 10/08/2019, 10:08 AM

## 2019-10-08 NOTE — [facility] Group Notes (Signed)
[facility] Group Notes:  (Nursing/MHT/Case Management/Adjunct)  Date:  10/08/2019  Time:  9:05 AM  Type of Therapy:  Comunity Meeting  Participation Level:  Active  Participation Quality:  Appropriate and Attentive  Affect:  Appropriate  Cognitive:  Alert and Appropriate  Insight:  Appropriate  Engagement in Group:  Engaged  Modes of Intervention:  Activity, Clarification, Discussion, Education and Support  Summary of Progress/Problems:  Kerrie Pleasure 10/08/2019, 9:05 AM
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
## 2019-10-08 NOTE — Progress Notes (Signed)
Pt went to one group today. Pt had no relief from her PRN for mild constipation. Will pass this information onto oncoming staff. Pt says that she feels the same as she did this morming mentally and emotionally. Torrie Mayers RN

## 2019-10-08 NOTE — Plan of Care (Signed)
Pt rates depression, anxiety and hopelessness all at 7/10. Pt denies SI, HI and AVH. Pt was educated on care plan and verbalizes understanding. Pt was encouraged to attend groups. Torrie Mayers RN Problem: Education: Goal: Knowledge of [facility] Education information/materials will improve Outcome: Progressing Goal: Emotional status will improve Outcome: Progressing Goal: Mental status will improve Outcome: Progressing Goal: Verbalization of understanding the information provided will improve Outcome: Progressing   Problem: Safety: Goal: Periods of time without injury will increase Outcome: Progressing   Problem: Physical Regulation: Goal: Complications related to the disease process, condition or treatment will be avoided or minimized Outcome: Progressing   Problem: Self-Concept: Goal: Ability to disclose and discuss suicidal ideas will improve Outcome: Progressing   Problem: Coping: Goal: Coping ability will improve Outcome: Progressing

## 2019-10-08 NOTE — [facility] Group Notes (Signed)
[facility] Group Notes:  (Nursing/MHT/Case Management/Adjunct)  Date:  10/08/2019  Time:  9:11 PM  Type of Therapy:  Group Therapy  Participation Level:  Active  Participation Quality:  Appropriate  Affect:  Appropriate and In group laughing at [place] talking loud in group.  Cognitive:  Alert  Insight:  Good  Engagement in Group:  Engaged  Modes of Intervention:  Support  Summary of Progress/Problems:  Mayra Neer 10/08/2019, 9:11 PM

## 2019-10-08 NOTE — [facility] Group Notes (Signed)
LCSW Group Therapy Note  10/08/2019 1:00PM   Type of Therapy and Topic: Group Therapy: Feelings Around Returning Home & Establishing a Supportive Framework and Supporting Oneself When Supports Not Available   Participation Level:  Did Not Attend   Description of Group:  Patients first processed thoughts and feelings about upcoming discharge. These included fears of upcoming changes, lack of change, new living environments, judgements and expectations from others and overall stigma of mental health issues. The group then discussed the definition of a supportive framework, what that looks and feels like, and how do to discern it from an unhealthy non-supportive network. The group identified different types of supports as well as what to do when your family/friends are less than helpful or unavailable   Therapeutic Goals  1. Patient will identify one healthy supportive network that they can use at discharge. 2. Patient will identify one factor of a supportive framework and how to tell it from an unhealthy network. 3. Patient able to identify one coping skill to use when they do not have positive supports from others. 4. Patient will demonstrate ability to communicate their needs through discussion and/or role plays.   Summary of Patient Progress:   X   Therapeutic Modalities Cognitive Behavioral Therapy Motivational Interviewing    Teresita Madura, MSW, LCSWA Clinical Social Work 10/08/2019 11:04 AM

## 2019-10-08 NOTE — [facility] Suicide Risk Assessment (Signed)
[facility] Admission Suicide Risk Assessment   Nursing information obtained from:  Patient Demographic factors:  Caucasian, Unemployed Current Mental Status:  Suicidal ideation indicated by patient Loss Factors:  Financial problems / change in socioeconomic status Historical Factors:  Impulsivity Risk Reduction Factors:  NA  Total Time spent with patient: 1 hour Principal Problem: Severe recurrent major depression without psychotic features (HCC) Diagnosis:  Principal Problem:   Severe recurrent major depression without psychotic features (HCC) Active Problems:   PTSD (post-traumatic stress disorder)   Methamphetamine abuse (HCC)   MDD (major depressive disorder), recurrent severe, without psychosis (HCC)   Urinary tract infection  Subjective Data: Patient seen and chart reviewed. Patient's chief complaint "I'm coming off drugs and I feel terrible". Patient reporting recent methamphetamine use most recently on Wednesday. Homeless. Not receiving any mental health care. Suicidal thoughts without specific current plan or intent. Vaguely stated hallucinations but no obvious acute delusions.  Continued Clinical Symptoms:  Alcohol Use Disorder Identification Test Final Score (AUDIT): 0 The "Alcohol Use Disorders Identification Test", Guidelines for Use in Primary Care, Second Edition.  World Science writer [facility]). Score between 0-7:  no or low risk or alcohol related problems. Score between 8-15:  moderate risk of alcohol related problems. Score between 16-19:  high risk of alcohol related problems. Score 20 or above:  warrants further diagnostic evaluation for alcohol dependence and treatment.   CLINICAL FACTORS:   Depression:   Anhedonia Comorbid alcohol abuse/dependence Alcohol/Substance Abuse/Dependencies   Musculoskeletal: Strength & Muscle Tone: within normal limits Gait & Station: normal Patient leans: N/A  Psychiatric Specialty Exam: Physical Exam  Constitutional: She  appears well-developed and well-nourished.  HENT:  Head: Normocephalic and atraumatic.  Eyes: Pupils are equal, round, and reactive to light. Conjunctivae are normal.  Cardiovascular: Normal heart sounds.  Respiratory: Effort normal.  GI: Soft.  Musculoskeletal:        General: Normal range of motion.     Cervical back: Normal range of motion.  Neurological: She is alert.  Skin: Skin is warm and dry.  Psychiatric: Her mood appears anxious. Her affect is blunt. Her speech is delayed. She is slowed. She expresses impulsivity. She exhibits a depressed mood. She expresses suicidal ideation. She expresses no suicidal plans. She exhibits abnormal recent memory.    Review of Systems  Constitutional: Negative.   HENT: Negative.   Eyes: Negative.   Respiratory: Negative.   Cardiovascular: Negative.   Gastrointestinal: Negative.   Musculoskeletal: Negative.   Skin: Negative.   Neurological: Negative.   Psychiatric/Behavioral: Positive for behavioral problems, dysphoric mood and hallucinations. The patient is nervous/anxious.     Blood pressure 109/66, pulse 94, temperature 98.2 F (36.8 C), temperature source Oral, resp. rate 18, height 5\' 4"  (1.626 m), weight 53.5 kg, last menstrual period 10/05/2019, SpO2 98 %.Body mass index is 20.25 kg/m.  General Appearance: Disheveled  Eye Contact:  Minimal  Speech:  Garbled and Slow  Volume:  Decreased  Mood:  Depressed and Dysphoric  Affect:  Depressed  Thought Process:  Coherent  Orientation:  Full (Time, Place, and Person)  Thought Content:  Logical  Suicidal Thoughts:  Yes.  without intent/plan  Homicidal Thoughts:  No  Memory:  Immediate;   Fair Recent;   Poor Remote;   Fair  Judgement:  Fair  Insight:  Fair  Psychomotor Activity:  Decreased  Concentration:  Concentration: Poor  Recall:  10/07/2019 of Knowledge:  Fair  Language:  Fair  Akathisia:  No  Handed:  Right  AIMS (if indicated):     Assets:  Desire for  Improvement Physical Health  ADL's:  Intact  Cognition:  WNL  Sleep:  Number of Hours: 7.25      COGNITIVE FEATURES THAT CONTRIBUTE TO RISK:  Thought constriction (tunnel vision)    SUICIDE RISK:   Mild:  Suicidal ideation of limited frequency, intensity, duration, and specificity.  There are no identifiable plans, no associated intent, mild dysphoria and related symptoms, good self-control (both objective and subjective assessment), few other risk factors, and identifiable protective factors, including available and accessible social support.  PLAN OF CARE: Continue 15-minute checks. Restart medicine for depression and anxiety. Manage physical complaints. Engage in individual and group therapy. Reassess dangerousness with discharge planning.  I certify that inpatient services furnished can reasonably be expected to improve the patient's condition.   Alethia Berthold, MD 10/08/2019, 11:08 AM
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
## 2019-10-09 MED ORDER — SENNOSIDES-DOCUSATE SODIUM 8.6-50 MG PO TABS
2.0000 | ORAL_TABLET | Freq: Once | ORAL | Status: AC
  Administered 2019-10-09: 2 via ORAL
  Filled 2019-10-09: qty 2

## 2019-10-09 MED ORDER — DOCUSATE SODIUM 100 MG PO CAPS
100.0000 mg | ORAL_CAPSULE | Freq: Two times a day (BID) | ORAL | Status: DC
  Administered 2019-10-09 – 2019-10-13 (×8): 100 mg via ORAL
  Filled 2019-10-09 (×9): qty 1

## 2019-10-09 NOTE — Plan of Care (Signed)
D- Patient alert and oriented. Patient presents in an anxious, but pleasant mood on assessment stating that she slept good last night. Patient had complaints of a headache, rating her pain a "7/10", in which she did request pain medication from this Clinical research associate. Patient endorsed depression and anxiety, reporting that she "wants to use" and "I'm worried about where I'm going after I leave". Patient denies SI, HI, AVH at this time. Patient had no stated goals for today.  A- Scheduled medications administered to patient, per MD orders. Support and encouragement provided.  Routine safety checks conducted every 15 minutes.  Patient informed to notify staff with problems or concerns.  R- No adverse drug reactions noted. Patient contracts for safety at this time. Patient compliant with medications and treatment plan. Patient receptive, calm, and cooperative. Patient interacts well with others on the unit.  Patient remains safe at this time.  Problem: Education: Goal: Knowledge of [facility] Education information/materials will improve Outcome: Progressing Goal: Emotional status will improve Outcome: Progressing Goal: Mental status will improve Outcome: Progressing Goal: Verbalization of understanding the information provided will improve Outcome: Progressing   Problem: Safety: Goal: Periods of time without injury will increase Outcome: Progressing   Problem: Physical Regulation: Goal: Complications related to the disease process, condition or treatment will be avoided or minimized Outcome: Progressing   Problem: Self-Concept: Goal: Ability to disclose and discuss suicidal ideas will improve Outcome: Progressing   Problem: Coping: Goal: Coping ability will improve Outcome: Progressing

## 2019-10-09 NOTE — B[facility] Group Notes (Signed)
LCSW Group Therapy Notes   Date and Time: 1:00PM  Type of Therapy and Topic: Group Therapy: Worry and Anxiety  Participation Level: [facility] PARTICIPATION LEVEL: Did Not Attend  Description of Group: In this group, patients will be encouraged to explore their worry around what could happen vs what will happen. Each patient will be challenged to think of personal worries and how they will work their way through that worry around what will happen and what could happen. This group will be process-oriented, with patients participating in exploration of their own experiences as well as giving and receiving support and challenge from other group members.  Therapeutic Goals: 1. Patient will identify personal worries that cause anxiety. 2. Patient will identify clues to identify their worry. 3. Patient will identify ways to handle their worry. 4. Patient will discuss ways that their worry has deceased or why it has not decreased.   Summary of Patient Progress:  X  Therapeutic Modalities:  Cognitive Behavioral Therapy Solution Focused Therapy Motivational Interviewing    Mont Burnett Lieber, MSW, LCSWA Clinical Social Worker   

## 2019-10-09 NOTE — Progress Notes (Signed)
[facility] MD Progress Note  10/09/2019 10:22 AM Lori Miller  MRN:  [id number] Subjective: Patient seen chart reviewed.  Patient continues to describe herself as depressed.  Anxious.  Little motivation.  Patient was up this morning only to ask for medicine for anxiety.  She also tells me she is constipated and has not had a bowel movement in a few days.  Patient looks withdrawn and uncomfortable.  Vital signs stable.  So far medication compliant. Principal Problem: Severe recurrent major depression without psychotic features (HCC) Diagnosis: Principal Problem:   Severe recurrent major depression without psychotic features (HCC) Active Problems:   PTSD (post-traumatic stress disorder)   Methamphetamine abuse (HCC)   MDD (major depressive disorder), recurrent severe, without psychosis (HCC)   Urinary tract infection  Total Time spent with patient: 30 minutes  Past Psychiatric History: Past history of substance abuse and depression and PTSD  Past Medical History:  Past Medical History:  Diagnosis Date  . Anxiety   . Anxiety   . Depression   . Depression   . Migraine   . PTSD (post-traumatic stress disorder)     Past Surgical History:  Procedure Laterality Date  . BUNIONECTOMY    . CYST EXCISION     Family History: History reviewed. No pertinent family history. Family Psychiatric  History: See previous Social History:  Social History   Substance and Sexual Activity  Alcohol Use Yes  . Alcohol/week: 4.0 standard drinks  . Types: 4 Shots of liquor per week     Social History   Substance and Sexual Activity  Drug Use Yes  . Frequency: 6.0 times per week  . Types: Marijuana, Benzodiazepines, Methamphetamines   Comment: marijuana daily; crack daily x 3 months    Social History   Socioeconomic History  . Marital status: Single    Spouse name: Not on file  . Number of children: Not on file  . Years of education: Not on file  . Highest education level: Not on file   Occupational History  . Not on file  Tobacco Use  . Smoking status: Current Every Day Smoker    Packs/day: 1.00    Types: Cigarettes  . Smokeless tobacco: Never Used  Substance and Sexual Activity  . Alcohol use: Yes    Alcohol/week: 4.0 standard drinks    Types: 4 Shots of liquor per week  . Drug use: Yes    Frequency: 6.0 times per week    Types: Marijuana, Benzodiazepines, Methamphetamines    Comment: marijuana daily; crack daily x 3 months  . Sexual activity: Yes  Other Topics Concern  . Not on file  Social History Narrative  . Not on file   Social Determinants of Health   Financial Resource Strain:   . Difficulty of Paying Living Expenses: Not on file  Food Insecurity:   . Worried About Programme researcher, broadcasting/film/video in the Last Year: Not on file  . Ran Out of Food in the Last Year: Not on file  Transportation Needs:   . Lack of Transportation (Medical): Not on file  . Lack of Transportation (Non-Medical): Not on file  Physical Activity:   . Days of Exercise per Week: Not on file  . Minutes of Exercise per Session: Not on file  Stress:   . Feeling of Stress : Not on file  Social Connections:   . Frequency of Communication with Friends and Family: Not on file  . Frequency of Social Gatherings with Friends and Family: Not on  file  . Attends Religious Services: Not on file  . Active Member of Clubs or Organizations: Not on file  . Attends Banker Meetings: Not on file  . Marital Status: Not on file   Additional Social History:                         Sleep: Fair  Appetite:  Fair  Current Medications: Current Facility-Administered Medications  Medication Dose Route Frequency Provider Last Rate Last Admin  . acetaminophen (TYLENOL) tablet 650 mg  650 mg Oral Q6H PRN Money, Gerlene Burdock, FNP   650 mg at 10/08/19 1244  . alum & mag hydroxide-simeth (MAALOX/MYLANTA) 200-200-20 MG/5ML suspension 30 mL  30 mL Oral Q4H PRN Money, Gerlene Burdock, FNP   30 mL at  10/08/19 0629  . cephALEXin (KEFLEX) capsule 500 mg  500 mg Oral Q6H Soul Deveney, Jackquline Denmark, MD   500 mg at 10/09/19 6144  . docusate sodium (COLACE) capsule 100 mg  100 mg Oral BID Urian Martenson T, MD      . FLUoxetine (PROZAC) capsule 20 mg  20 mg Oral Daily Rebie Peale, Jackquline Denmark, MD   20 mg at 10/09/19 0819  . gabapentin (NEURONTIN) tablet 300 mg  300 mg Oral TID Money, Gerlene Burdock, FNP   300 mg at 10/09/19 3154  . hydrOXYzine (ATARAX/VISTARIL) tablet 50 mg  50 mg Oral TID PRN Karizma Cheek, Jackquline Denmark, MD   50 mg at 10/09/19 0939  . ibuprofen (ADVIL) tablet 600 mg  600 mg Oral Q6H PRN Joshlyn Beadle, Jackquline Denmark, MD   600 mg at 10/09/19 0819  . magnesium hydroxide (MILK OF MAGNESIA) suspension 30 mL  30 mL Oral Daily PRN Money, Feliz Beam B, FNP   30 mL at 10/08/19 1243  . pantoprazole (PROTONIX) EC tablet 40 mg  40 mg Oral Daily Quientin Jent, Jackquline Denmark, MD   40 mg at 10/09/19 0819  . QUEtiapine (SEROQUEL) tablet 100 mg  100 mg Oral QHS Pleas Carneal T, MD   100 mg at 10/08/19 2109  . QUEtiapine (SEROQUEL) tablet 50 mg  50 mg Oral Q6H PRN Meerab Maselli, Jackquline Denmark, MD   50 mg at 10/09/19 0819  . senna-docusate (Senokot-S) tablet 2 tablet  2 tablet Oral Once Emillio Ngo T, MD      . traZODone (DESYREL) tablet 50 mg  50 mg Oral QHS PRN Money, Gerlene Burdock, FNP   50 mg at 10/08/19 2109    Lab Results:  Results for orders placed or performed during the hospital encounter of 10/06/19 (from the past 48 hour(s))  Respiratory Panel by RT PCR (Flu A&B, Covid) - Nasopharyngeal Swab     Status: None   Collection Time: 10/07/19  3:10 PM   Specimen: Nasopharyngeal Swab  Result Value Ref Range   SARS Coronavirus 2 by RT PCR NEGATIVE NEGATIVE    Comment: (NOTE) SARS-CoV-2 target nucleic acids are NOT DETECTED. The SARS-CoV-2 RNA is generally detectable in upper respiratoy specimens during the acute phase of infection. The lowest concentration of SARS-CoV-2 viral copies this assay can detect is 131 copies/mL. A negative result does not preclude  SARS-Cov-2 infection and should not be used as the sole basis for treatment or other patient management decisions. A negative result may occur with  improper specimen collection/handling, submission of specimen other than nasopharyngeal swab, presence of viral mutation(s) within the areas targeted by this assay, and inadequate number of viral copies (<131 copies/mL). A negative result must be combined  with clinical observations, patient history, and epidemiological information. The expected result is Negative. Fact Sheet for Patients:  https://www.moore.com/ Fact Sheet for Healthcare Providers:  https://www.young.biz/ This test is not yet ap proved or cleared by the [place] FDA and  has been authorized for detection and/or diagnosis of SARS-CoV-2 by FDA under an Emergency Use Authorization (EUA). This EUA will remain  in effect (meaning this test can be used) for the duration of the COVID-19 declaration under Section 564(b)(1) of the Act, 21 U.S.C. section 360bbb-3(b)(1), unless the authorization is terminated or revoked sooner.    Influenza A by PCR NEGATIVE NEGATIVE   Influenza B by PCR NEGATIVE NEGATIVE    Comment: (NOTE) The Xpert Xpress SARS-CoV-2/FLU/RSV assay is intended as an aid in  the diagnosis of influenza from Nasopharyngeal swab specimens and  should not be used as a sole basis for treatment. Nasal washings and  aspirates are unacceptable for Xpert Xpress SARS-CoV-2/FLU/RSV  testing. Fact Sheet for Patients: https://www.moore.com/ Fact Sheet for Healthcare Providers: https://www.young.biz/ This test is not yet approved or cleared by the [place] FDA and  has been authorized for detection and/or diagnosis of SARS-CoV-2 by  FDA under an Emergency Use Authorization (EUA). This EUA will remain  in effect (meaning this test can be used) for the duration of the  Covid-19 declaration  under Section 564(b)(1) of the Act, 21  U.S.C. section 360bbb-3(b)(1), unless the authorization is  terminated or revoked. Performed at [facility], [address] [place], [place] [address]     Blood Alcohol level:  Lab Results  Component Value Date   [facility] <10 10/06/2019   ETH <10 03/07/2018    Metabolic Disorder Labs: Lab Results  Component Value Date   HGBA1C 5.3 03/09/2018   MPG 105.41 03/09/2018   MPG 111 02/27/2017   No results found for: PROLACTIN Lab Results  Component Value Date   CHOL 156 03/09/2018   TRIG 106 03/09/2018   HDL 57 03/09/2018   CHOLHDL 2.7 03/09/2018   VLDL 21 03/09/2018   LDLCALC 78 03/09/2018   LDLCALC 100 (H) 02/27/2017    Physical Findings: AIMS:  , ,  ,  ,    CIWA:    COWS:     Musculoskeletal: Strength & Muscle Tone: within normal limits Gait & Station: normal Patient leans: N/A  Psychiatric Specialty Exam: Physical Exam  Nursing note and vitals reviewed. Constitutional: She appears well-developed and well-nourished.  HENT:  Head: Normocephalic and atraumatic.  Eyes: Pupils are equal, round, and reactive to light. Conjunctivae are normal.  Cardiovascular: Regular rhythm and normal heart sounds.  Respiratory: Effort normal. No respiratory distress.  GI: Soft.  Musculoskeletal:        General: Normal range of motion.     Cervical back: Normal range of motion.  Neurological: She is alert.  Skin: Skin is warm and dry.  Psychiatric: Her affect is blunt. Her speech is delayed. She is slowed and withdrawn. Cognition and memory are impaired. She expresses impulsivity. She exhibits a depressed mood. She expresses suicidal ideation. She expresses no suicidal plans.    Review of Systems  Constitutional: Negative.   HENT: Negative.   Eyes: Negative.   Respiratory: Negative.   Cardiovascular: Negative.   Gastrointestinal: Positive for constipation.  Musculoskeletal: Negative.   Skin: Negative.   Neurological: Negative.    Psychiatric/Behavioral: Positive for dysphoric mood and suicidal ideas.    Blood pressure 120/69, pulse 84, temperature 97.8 F (36.6 C), temperature source Oral, resp.  rate 17, height 5\' 4"  (1.626 m), weight 53.5 kg, last menstrual period 10/05/2019, SpO2 99 %.Body mass index is 20.25 kg/m.  General Appearance: Casual  Eye Contact:  Good  Speech:  Clear and Coherent  Volume:  Normal  Mood:  Depressed  Affect:  Congruent  Thought Process:  Coherent  Orientation:  Full (Time, Place, and Person)  Thought Content:  Logical  Suicidal Thoughts:  Yes.  without intent/plan  Homicidal Thoughts:  No  Memory:  Immediate;   Fair Recent;   Fair Remote;   Fair  Judgement:  Fair  Insight:  Fair  Psychomotor Activity:  Decreased  Concentration:  Concentration: Fair  Recall:  [employer] of Knowledge:  Fair  Language:  Fair  Akathisia:  No  Handed:  Right  AIMS (if indicated):     Assets:  Desire for Improvement  ADL's:  Impaired  Cognition:  Impaired,  Mild  Sleep:  Number of Hours: 7.75     Treatment Plan Summary: Daily contact with patient to assess and evaluate symptoms and progress in treatment, Medication management and Plan Patient is continuing to recover from methamphetamine abuse.  Physically appears to be stable.  For constipation I added one-time dose of Senokot and then standing Colace.  Encourage patient to try and be involved in groups and up out of bed.  No change for today to current medication reassess tomorrow.  Alethia Berthold, MD 10/09/2019, 10:22 AM

## 2019-10-09 NOTE — [facility] Group Notes (Signed)
[facility] Group Notes:  (Nursing/MHT/Case Management/Adjunct)  Date:  10/09/2019  Time:  9:59 AM  Type of Therapy:  community meeting  Participation Level:  Active  Participation Quality:  Appropriate  Affect:  Appropriate  Cognitive:  Alert  Insight:  Appropriate  Engagement in Group:  Engaged  Modes of Intervention:  Discussion  Summary of Progress/Problems:  Lori Miller 10/09/2019, 9:59 AM

## 2019-10-09 NOTE — [facility] Group Notes (Signed)
[facility] Group Notes:  (Nursing/MHT/Case Management/Adjunct)  Date:  10/09/2019  Time:  11:23 AM  Type of Therapy:  Psychoeducational Skills  Participation Level:  Did Not Attend  Kerrie Pleasure 10/09/2019, 11:23 AM

## 2019-10-09 NOTE — Progress Notes (Signed)
D: Patient has been calm and cooperative. Isolative. Denies SI, HI and AVH. Contracts for safety A: Continue to monitor for safety R: Safety maintained.

## 2019-10-09 NOTE — [facility] Group Notes (Signed)
[facility] Group Notes:  (Nursing/MHT/Case Management/Adjunct)  Date:  10/09/2019  Time:  9:16 PM  Type of Therapy:  Group Therapy  Participation Level:  Active  Participation Quality:  Appropriate  Affect:  Appropriate  Cognitive:  Appropriate  Insight:  Appropriate  Engagement in Group:  Engaged  Modes of Intervention:  Discussion  Summary of Progress/Problems:  Burt Ek 10/09/2019, 9:16 PM

## 2019-10-09 NOTE — Plan of Care (Signed)
  Problem: Safety: Goal: Periods of time without injury will increase Outcome: Progressing  D: Patient has been calm and cooperative. Isolative. Denies SI, HI and AVH. Contracts for safety A: Continue to monitor for safety R: Safety maintained. 

## 2019-10-10 NOTE — [facility] Group Notes (Signed)
[facility] Group Notes:  (Nursing/MHT/Case Management/Adjunct)  Date:  10/10/2019  Time:  1:04 PM  Type of Therapy:  Psychoeducational Skills  Participation Level:  Active  Participation Quality:  Appropriate, Attentive and Sharing  Affect:  Appropriate  Cognitive:  Alert and Appropriate  Insight:  Appropriate and Good  Engagement in Group:  Engaged  Modes of Intervention:  Discussion, Education, Exploration and Support  Summary of Progress/Problems:  Lynelle Smoke Dominic Mahaney 10/10/2019, 1:04 PM

## 2019-10-10 NOTE — [facility] Counselor (Signed)
CSW called [employer] to provide the authorization number.   Penni Homans, MSW, LCSW 10/10/2019 12:37 PM

## 2019-10-10 NOTE — [facility] Counselor (Signed)
CSW faxed referrals to [employer], [facility], [facility] and to [employer] for the authorization number.   CSW received message that fax was successful.  Penni Homans, MSW, LCSW 10/10/2019 11:35 AM

## 2019-10-10 NOTE — Progress Notes (Signed)
  Problem: Safety: Goal: Periods of time without injury will increase Outcome: Progressing  D: Patient has been calm and cooperative. Isolative. Denies SI, HI and AVH. Contracts for safety A: Continue to monitor for safety R: Safety maintained.

## 2019-10-10 NOTE — [facility] Group Notes (Signed)
[facility] Group Notes:  (Nursing/MHT/Case Management/Adjunct)  Date:  10/10/2019  Time:  9:58 PM  Type of Therapy:  Group Therapy  Participation Level:  Active  Participation Quality:  Appropriate  Affect:  Appropriate  Cognitive:  Appropriate  Insight:  Appropriate  Engagement in Group:  Engaged  Modes of Intervention:  Discussion  Summary of Progress/Problems:  Lori Miller 10/10/2019, 9:58 PM

## 2019-10-10 NOTE — Progress Notes (Signed)
Recreation Therapy Notes   Date: 10/10/2019  Time: 9:30 am  Location: Craft Room  Behavioral response: Appropriate   Intervention Topic: Stress Management   Discussion/Intervention:  Group content on today was focused on stress. The group defined stress and way to cope with stress. Participants expressed how they know when they are stresses out. Individuals described the different ways they have to cope with stress. The group stated reasons why it is important to cope with stress. Patient explained what good stress is and some examples. The group participated in the intervention "Managing Stress". Individuals were able to brainstorm new ways to manage their stress.  Clinical Observations/Feedback:  Patient came to group and was focused on what peers and staff have to say about teamwork. Individual was social with peers and staff while participating in group.     Katalia Choma LRT/CTRS         Albin Duckett 10/10/2019 11:27 AM

## 2019-10-10 NOTE — [facility] Group Notes (Signed)
[facility] Group Notes:  (Nursing/MHT/Case Management/Adjunct)  Date:  10/10/2019  Time:  9:14 AM  Type of Therapy:  Community Meeting  Participation Level:  Active  Participation Quality:  Appropriate, Attentive and Sharing  Affect:  Appropriate  Cognitive:  Alert and Appropriate  Insight:  Appropriate  Engagement in Group:  Engaged  Modes of Intervention:  Activity, Discussion and Education  Summary of Progress/Problems:  Kerrie Pleasure 10/10/2019, 9:14 AM

## 2019-10-10 NOTE — Plan of Care (Signed)
Patient came to staff for anxiety medicine states " I cannot think right."Patient is not able to say any triggering reason for her anxiety.Patient had anxiety medicine x3 today.Denies SI,HI and AVH.Visible in the milieu.Appropriate with staff & peers.Did not attend most of the groups.Compliant with medications.Appetite and energy level good.Support and encouragement given.

## 2019-10-10 NOTE — Progress Notes (Signed)
Recreation Therapy Notes  INPATIENT RECREATION THERAPY ASSESSMENT  Patient Details Name: Lori Miller MRN: [id number] DOB: 06/18/1981 Today's Date: 10/10/2019       Information Obtained From: Patient  Able to Participate in Assessment/Interview: Yes  Patient Presentation: Responsive  Reason for Admission (Per Patient): Active Symptoms, Suicidal Ideation, Substance Abuse, Suicide Attempt  Patient Stressors:    Coping Skills:   Substance Abuse, Music  Leisure Interests (2+):  Music - Listen, Exercise - Walking  Frequency of Recreation/Participation: Monthly  Awareness of Community Resources:     Community Resources:     Current Use:    If no, Barriers?:    Expressed Interest in [employer] Information:    [place] of Residence:  Film/video editor  Patient Main Form of Transportation: Walk  Patient Strengths:  Good listener  Patient Identified Areas of Improvement:  N/A  Patient Goal for Hospitalization:  A good discharge plan  Current SI (including self-harm):  Yes  Current HI:  Yes  Current AVH: No  Staff Intervention Plan: Group Attendance, Collaborate with Interdisciplinary Treatment Team  Consent to Intern Participation: N/A  Tamia Dial 10/10/2019, 3:12 PM

## 2019-10-10 NOTE — [facility] Group Notes (Signed)
Overcoming Obstacles  10/10/2019 1PM  Type of Therapy and Topic:  Group Therapy:  Overcoming Obstacles  Participation Level:  Did Not Attend    Description of Group:    In this group patients will be encouraged to explore what they see as obstacles to their own wellness and recovery. They will be guided to discuss their thoughts, feelings, and behaviors related to these obstacles. The group will process together ways to cope with barriers, with attention given to specific choices patients can make. Each patient will be challenged to identify changes they are motivated to make in order to overcome their obstacles. This group will be process-oriented, with patients participating in exploration of their own experiences as well as giving and receiving support and challenge from other group members.   Therapeutic Goals: 1. Patient will identify personal and current obstacles as they relate to admission. 2. Patient will identify barriers that currently interfere with their wellness or overcoming obstacles.  3. Patient will identify feelings, thought process and behaviors related to these barriers. 4. Patient will identify two changes they are willing to make to overcome these obstacles:      Summary of Patient Progress     Therapeutic Modalities:   Cognitive Behavioral Therapy Solution Focused Therapy Motivational Interviewing Relapse Prevention Therapy    Lowella Dandy, MSW, LCSW 10/10/2019 2:24 PM

## 2019-10-10 NOTE — [facility] Group Notes (Signed)
[facility] Group Notes:  (Nursing/MHT/Case Management/Adjunct)  Date:  10/10/2019  Time:  3:23 PM  Type of Therapy:  Music Group  Participation Level:  Did Not Attend   Summary of Progress/Problems:  Kerrie Pleasure 10/10/2019, 3:23 PM

## 2019-10-10 NOTE — [facility] Group Notes (Signed)
[facility] Group Notes:  (Nursing/MHT/Case Management/Adjunct)  Date:  10/10/2019  Time:  4:12 PM  Type of Therapy:  Communication Group  Participation Level:  Did Not Attend   Lynelle Smoke [facility] 10/10/2019, 4:12 PM

## 2019-10-10 NOTE — Progress Notes (Signed)
[facility] MD Progress Note  10/10/2019 2:07 PM Lori Miller  MRN:  [id number] Subjective: Follow-up for this patient with depression and PTSD and methamphetamine abuse.  Patient is looking a little better today.  She gets up out of bed and goes to meals and is taking care of her hygiene a little better.  She engages in conversation more appropriately.  Patient acknowledges this but also says that she is having trouble "focusing".  On the other hand she admits that this is the first time she has tried to focus on anything in a long time.  No complaints of any side effects from medicine.  Sleep somewhat adequate last night.  Patient is stating that she feels like she should go for substance abuse rehab treatment Principal Problem: Severe recurrent major depression without psychotic features (HCC) Diagnosis: Principal Problem:   Severe recurrent major depression without psychotic features (HCC) Active Problems:   PTSD (post-traumatic stress disorder)   Methamphetamine abuse (HCC)   MDD (major depressive disorder), recurrent severe, without psychosis (HCC)   Urinary tract infection  Total Time spent with patient: 30 minutes  Past Psychiatric History: Past history of depression and PTSD and substance abuse problems.  Past inpatient treatment.  Past Medical History:  Past Medical History:  Diagnosis Date  . Anxiety   . Anxiety   . Depression   . Depression   . Migraine   . PTSD (post-traumatic stress disorder)     Past Surgical History:  Procedure Laterality Date  . BUNIONECTOMY    . CYST EXCISION     Family History: History reviewed. No pertinent family history. Family Psychiatric  History: See previous Social History:  Social History   Substance and Sexual Activity  Alcohol Use Yes  . Alcohol/week: 4.0 standard drinks  . Types: 4 Shots of liquor per week     Social History   Substance and Sexual Activity  Drug Use Yes  . Frequency: 6.0 times per week  . Types: Marijuana,  Benzodiazepines, Methamphetamines   Comment: marijuana daily; crack daily x 3 months    Social History   Socioeconomic History  . Marital status: Single    Spouse name: Not on file  . Number of children: Not on file  . Years of education: Not on file  . Highest education level: Not on file  Occupational History  . Not on file  Tobacco Use  . Smoking status: Current Every Day Smoker    Packs/day: 1.00    Types: Cigarettes  . Smokeless tobacco: Never Used  Substance and Sexual Activity  . Alcohol use: Yes    Alcohol/week: 4.0 standard drinks    Types: 4 Shots of liquor per week  . Drug use: Yes    Frequency: 6.0 times per week    Types: Marijuana, Benzodiazepines, Methamphetamines    Comment: marijuana daily; crack daily x 3 months  . Sexual activity: Yes  Other Topics Concern  . Not on file  Social History Narrative  . Not on file   Social Determinants of Health   Financial Resource Strain:   . Difficulty of Paying Living Expenses: Not on file  Food Insecurity:   . Worried About Programme researcher, broadcasting/film/video in the Last Year: Not on file  . Ran Out of Food in the Last Year: Not on file  Transportation Needs:   . Lack of Transportation (Medical): Not on file  . Lack of Transportation (Non-Medical): Not on file  Physical Activity:   . Days of  Exercise per Week: Not on file  . Minutes of Exercise per Session: Not on file  Stress:   . Feeling of Stress : Not on file  Social Connections:   . Frequency of Communication with Friends and Family: Not on file  . Frequency of Social Gatherings with Friends and Family: Not on file  . Attends Religious Services: Not on file  . Active Member of Clubs or Organizations: Not on file  . Attends Archivist Meetings: Not on file  . Marital Status: Not on file   Additional Social History:                         Sleep: Fair  Appetite:  Fair  Current Medications: Current Facility-Administered Medications  Medication  Dose Route Frequency Provider Last Rate Last Admin  . acetaminophen (TYLENOL) tablet 650 mg  650 mg Oral Q6H PRN Money, Lowry Ram, FNP   650 mg at 10/08/19 1244  . alum & mag hydroxide-simeth (MAALOX/MYLANTA) 200-200-20 MG/5ML suspension 30 mL  30 mL Oral Q4H PRN Money, Lowry Ram, FNP   30 mL at 10/08/19 0629  . cephALEXin (KEFLEX) capsule 500 mg  500 mg Oral Q6H Taina Landry T, MD   500 mg at 10/10/19 1246  . docusate sodium (COLACE) capsule 100 mg  100 mg Oral BID Elenor Wildes, Madie Reno, MD   100 mg at 10/10/19 8299  . FLUoxetine (PROZAC) capsule 20 mg  20 mg Oral Daily Nishanth Mccaughan, Madie Reno, MD   20 mg at 10/10/19 3716  . gabapentin (NEURONTIN) tablet 300 mg  300 mg Oral TID Money, Lowry Ram, FNP   300 mg at 10/10/19 1247  . hydrOXYzine (ATARAX/VISTARIL) tablet 50 mg  50 mg Oral TID PRN Ardelia Wrede, Madie Reno, MD   50 mg at 10/10/19 0813  . ibuprofen (ADVIL) tablet 600 mg  600 mg Oral Q6H PRN Jorje Vanatta, Madie Reno, MD   600 mg at 10/09/19 1726  . magnesium hydroxide (MILK OF MAGNESIA) suspension 30 mL  30 mL Oral Daily PRN Money, Darnelle Maffucci B, FNP   30 mL at 10/08/19 1243  . pantoprazole (PROTONIX) EC tablet 40 mg  40 mg Oral Daily Khaleah Duer, Madie Reno, MD   40 mg at 10/10/19 9678  . QUEtiapine (SEROQUEL) tablet 100 mg  100 mg Oral QHS Renarda Mullinix, Madie Reno, MD   100 mg at 10/09/19 2114  . QUEtiapine (SEROQUEL) tablet 50 mg  50 mg Oral Q6H PRN Amiylah Anastos, Madie Reno, MD   50 mg at 10/10/19 1023  . traZODone (DESYREL) tablet 50 mg  50 mg Oral QHS PRN Money, Lowry Ram, FNP   50 mg at 10/09/19 2114    Lab Results: No results found for this or any previous visit (from the past 48 hour(s)).  Blood Alcohol level:  Lab Results  Component Value Date   ETH <10 10/06/2019   ETH <10 93/81/0175    Metabolic Disorder Labs: Lab Results  Component Value Date   HGBA1C 5.3 03/09/2018   MPG 105.41 03/09/2018   MPG 111 02/27/2017   No results found for: PROLACTIN Lab Results  Component Value Date   CHOL 156 03/09/2018   TRIG 106 03/09/2018   HDL  57 03/09/2018   CHOLHDL 2.7 03/09/2018   VLDL 21 03/09/2018   LDLCALC 78 03/09/2018   LDLCALC 100 (H) 02/27/2017    Physical Findings: AIMS:  , ,  ,  ,    CIWA:    COWS:  Musculoskeletal: Strength & Muscle Tone: within normal limits Gait & Station: normal Patient leans: N/A  Psychiatric Specialty Exam: Physical Exam  Nursing note and vitals reviewed. Constitutional: She appears well-developed and well-nourished.  HENT:  Head: Normocephalic and atraumatic.  Eyes: Pupils are equal, round, and reactive to light. Conjunctivae are normal.  Cardiovascular: Regular rhythm and normal heart sounds.  Respiratory: Effort normal. No respiratory distress.  GI: Soft.  Musculoskeletal:        General: Normal range of motion.     Cervical back: Normal range of motion.  Neurological: She is alert.  Skin: Skin is warm and dry.  Psychiatric: Judgment normal. Her speech is delayed. She is slowed and withdrawn. She exhibits a depressed mood. She expresses suicidal ideation. She expresses no suicidal plans. She exhibits abnormal recent memory.    Review of Systems  Constitutional: Negative.   HENT: Negative.   Eyes: Negative.   Respiratory: Negative.   Cardiovascular: Negative.   Gastrointestinal: Negative.   Musculoskeletal: Negative.   Skin: Negative.   Neurological: Negative.   Psychiatric/Behavioral: Positive for confusion and dysphoric mood.    Blood pressure 112/73, pulse 95, temperature 98.4 F (36.9 C), temperature source Oral, resp. rate 18, height 5\' 4"  (1.626 m), weight 53.5 kg, last menstrual period 10/05/2019, SpO2 99 %.Body mass index is 20.25 kg/m.  General Appearance: Casual  Eye Contact:  Fair  Speech:  Slow  Volume:  Decreased  Mood:  Dysphoric  Affect:  Congruent  Thought Process:  Coherent  Orientation:  Full (Time, Place, and Person)  Thought Content:  Logical  Suicidal Thoughts:  No  Homicidal Thoughts:  No  Memory:  Immediate;   Fair Recent;    Fair Remote;   Fair  Judgement:  Fair  Insight:  Fair  Psychomotor Activity:  Decreased  Concentration:  Concentration: Fair  Recall:  10/07/2019 of Knowledge:  Fair  Language:  Fair  Akathisia:  No  Handed:  Right  AIMS (if indicated):     Assets:  Communication Skills Desire for Improvement Resilience  ADL's:  Impaired  Cognition:  Impaired,  Mild  Sleep:  Number of Hours: 8     Treatment Plan Summary: Daily contact with patient to assess and evaluate symptoms and progress in treatment, Medication management and Plan 39 year old woman with depression PTSD and substance abuse.  I reassured her that detoxing from methamphetamine and from the long period of depression she had will inevitably make her feel like she cannot focus.  I do not think we need to add any extra medicine or worry too much about medicine being related to that.  I encouraged her to keep attending groups.  Treatment team will discuss likely having her referred to inpatient substance abuse treatment.  39, MD 10/10/2019, 2:07 PM

## 2019-10-11 MED ORDER — ONDANSETRON HCL 4 MG PO TABS
4.0000 mg | ORAL_TABLET | Freq: Three times a day (TID) | ORAL | Status: DC | PRN
  Administered 2019-10-11 – 2019-10-13 (×2): 4 mg via ORAL
  Filled 2019-10-11 (×2): qty 1

## 2019-10-11 MED ORDER — MELOXICAM 7.5 MG PO TABS
7.5000 mg | ORAL_TABLET | Freq: Every day | ORAL | Status: DC
  Administered 2019-10-11 – 2019-10-13 (×3): 7.5 mg via ORAL
  Filled 2019-10-11 (×3): qty 1

## 2019-10-11 MED ORDER — GABAPENTIN 300 MG PO CAPS
300.0000 mg | ORAL_CAPSULE | Freq: Three times a day (TID) | ORAL | Status: DC
  Administered 2019-10-12 – 2019-10-13 (×5): 300 mg via ORAL
  Filled 2019-10-11 (×5): qty 1

## 2019-10-11 NOTE — Progress Notes (Addendum)
   10/11/19 1400  Clinical Encounter Type  Visited With Patient;Other (Comment)  Visit Type Initial;Spiritual support;Social support;Behavioral Health  Referral From Chaplain  Consult/Referral To Chaplain  Chaplain encountered patient coming down the hall and asked if she was going to group and she said yes. Patient attended and participated in a group on How Smiling Affects [place]. There were times during the group that tears welled up in patient's eyes and Chaplain asked her what those tears meant? Patient explained how she helps everyone and forgets about herself. She also mentioned how her son said something to her that hurt her. Patient doesn't like crying, but Chaplain encouraged her to cry, saying crying is a part of healing. Chaplain asked patient if she had a journal. Patient said no because when she use to write, people would hold what she wrote against her. Chaplain promised that whatever she wrote in her journal would only be shared if she wanted it to be. Chaplain gave patient a journal. Chaplain prayed for patient before ending the group.

## 2019-10-11 NOTE — B[facility] Counselor (Signed)
CSW received call from nurses station that pt is declining to call the [facility] program.  Penni Homans, MSW, LCSW 10/11/2019 1:55 PM

## 2019-10-11 NOTE — Progress Notes (Signed)
[facility] MD Progress Note  10/11/2019 3:04 PM Lori Miller  MRN:  [id number] Subjective: Im doing better. I am out of my room. I am being compliant and taking my medication I am still not receiving much for pain management. The Tylenol and Ibuprofen are not working at this time. She states her goal is to keep pushing her self to work with others and on herself. .  Objective:  She was placed on CIWA protocol and no specific detox treatment upon admission. Her CIWA scores have been zero therefore will b discontinued today. Today she seems to be more irritable and demanding, yet remain open and optimistic about discharge and residential treatment options. She remains on Prozac and Seroquel at this time. She does request an increase on the Seroquel noting modest improvement of hr sleep and mood. She is observed attending most groups on the unit and continues to work on herself. She denies any depression, anxiety, and anger today. She is eating well with no disturbances at this time. She denies si/hi/avh.    Principal Problem: Severe recurrent major depression without psychotic features (HCC) Diagnosis: Principal Problem:   Severe recurrent major depression without psychotic features (HCC) Active Problems:   PTSD (post-traumatic stress disorder)   Methamphetamine abuse (HCC)   MDD (major depressive disorder), recurrent severe, without psychosis (HCC)   Urinary tract infection  Total Time spent with patient: 30 minutes  Past Psychiatric History: Past history of depression and PTSD and substance abuse problems.  Past inpatient treatment.  Past Medical History:  Past Medical History:  Diagnosis Date  . Anxiety   . Anxiety   . Depression   . Depression   . Migraine   . PTSD (post-traumatic stress disorder)     Past Surgical History:  Procedure Laterality Date  . BUNIONECTOMY    . CYST EXCISION     Family History: History reviewed. No pertinent family history. Family Psychiatric   History: See previous Social History:  Social History   Substance and Sexual Activity  Alcohol Use Yes  . Alcohol/week: 4.0 standard drinks  . Types: 4 Shots of liquor per week     Social History   Substance and Sexual Activity  Drug Use Yes  . Frequency: 6.0 times per week  . Types: Marijuana, Benzodiazepines, Methamphetamines   Comment: marijuana daily; crack daily x 3 months    Social History   Socioeconomic History  . Marital status: Single    Spouse name: Not on file  . Number of children: Not on file  . Years of education: Not on file  . Highest education level: Not on file  Occupational History  . Not on file  Tobacco Use  . Smoking status: Current Every Day Smoker    Packs/day: 1.00    Types: Cigarettes  . Smokeless tobacco: Never Used  Substance and Sexual Activity  . Alcohol use: Yes    Alcohol/week: 4.0 standard drinks    Types: 4 Shots of liquor per week  . Drug use: Yes    Frequency: 6.0 times per week    Types: Marijuana, Benzodiazepines, Methamphetamines    Comment: marijuana daily; crack daily x 3 months  . Sexual activity: Yes  Other Topics Concern  . Not on file  Social History Narrative  . Not on file   Social Determinants of Health   Financial Resource Strain:   . Difficulty of Paying Living Expenses: Not on file  Food Insecurity:   . Worried About Programme researcher, broadcasting/film/video  in the Last Year: Not on file  . Ran Out of Food in the Last Year: Not on file  Transportation Needs:   . Lack of Transportation (Medical): Not on file  . Lack of Transportation (Non-Medical): Not on file  Physical Activity:   . Days of Exercise per Week: Not on file  . Minutes of Exercise per Session: Not on file  Stress:   . Feeling of Stress : Not on file  Social Connections:   . Frequency of Communication with Friends and Family: Not on file  . Frequency of Social Gatherings with Friends and Family: Not on file  . Attends Religious Services: Not on file  . Active  Member of Clubs or Organizations: Not on file  . Attends Archivist Meetings: Not on file  . Marital Status: Not on file   Additional Social History:                         Sleep: Fair  Appetite:  Fair  Current Medications: Current Facility-Administered Medications  Medication Dose Route Frequency Provider Last Rate Last Admin  . acetaminophen (TYLENOL) tablet 650 mg  650 mg Oral Q6H PRN Money, Lowry Ram, FNP   650 mg at 10/11/19 9381  . alum & mag hydroxide-simeth (MAALOX/MYLANTA) 200-200-20 MG/5ML suspension 30 mL  30 mL Oral Q4H PRN Money, Lowry Ram, FNP   30 mL at 10/08/19 0629  . cephALEXin (KEFLEX) capsule 500 mg  500 mg Oral Q6H Clapacs, John T, MD   500 mg at 10/11/19 1213  . docusate sodium (COLACE) capsule 100 mg  100 mg Oral BID Clapacs, Madie Reno, MD   100 mg at 10/11/19 0814  . FLUoxetine (PROZAC) capsule 20 mg  20 mg Oral Daily Clapacs, Madie Reno, MD   20 mg at 10/11/19 0815  . gabapentin (NEURONTIN) tablet 300 mg  300 mg Oral TID Money, Lowry Ram, FNP   300 mg at 10/11/19 1212  . hydrOXYzine (ATARAX/VISTARIL) tablet 50 mg  50 mg Oral TID PRN Clapacs, Madie Reno, MD   50 mg at 10/11/19 8299  . ibuprofen (ADVIL) tablet 600 mg  600 mg Oral Q6H PRN Clapacs, Madie Reno, MD   600 mg at 10/09/19 1726  . magnesium hydroxide (MILK OF MAGNESIA) suspension 30 mL  30 mL Oral Daily PRN Money, Darnelle Maffucci B, FNP   30 mL at 10/08/19 1243  . ondansetron (ZOFRAN) tablet 4 mg  4 mg Oral Q8H PRN Clapacs, Madie Reno, MD   4 mg at 10/11/19 1213  . pantoprazole (PROTONIX) EC tablet 40 mg  40 mg Oral Daily Clapacs, Madie Reno, MD   40 mg at 10/11/19 0814  . QUEtiapine (SEROQUEL) tablet 100 mg  100 mg Oral QHS Clapacs, Madie Reno, MD   100 mg at 10/10/19 2145  . QUEtiapine (SEROQUEL) tablet 50 mg  50 mg Oral Q6H PRN Clapacs, Madie Reno, MD   50 mg at 10/11/19 0817  . traZODone (DESYREL) tablet 50 mg  50 mg Oral QHS PRN Money, Lowry Ram, FNP   50 mg at 10/10/19 2145    Lab Results: No results found for this or any  previous visit (from the past 48 hour(s)).  Blood Alcohol level:  Lab Results  Component Value Date   [facility] <10 10/06/2019   ETH <10 37/16/9678    Metabolic Disorder Labs: Lab Results  Component Value Date   HGBA1C 5.3 03/09/2018   MPG 105.41 03/09/2018  MPG 111 02/27/2017   No results found for: PROLACTIN Lab Results  Component Value Date   CHOL 156 03/09/2018   TRIG 106 03/09/2018   HDL 57 03/09/2018   CHOLHDL 2.7 03/09/2018   VLDL 21 03/09/2018   LDLCALC 78 03/09/2018   LDLCALC 100 (H) 02/27/2017    Physical Findings: AIMS:  , ,  ,  ,    CIWA:    COWS:     Musculoskeletal: Strength & Muscle Tone: within normal limits Gait & Station: normal Patient leans: N/A  Psychiatric Specialty Exam: Physical Exam  Nursing note and vitals reviewed. Constitutional: She appears well-developed and well-nourished.  HENT:  Head: Normocephalic and atraumatic.  Eyes: Pupils are equal, round, and reactive to light. Conjunctivae are normal.  Cardiovascular: Regular rhythm and normal heart sounds.  Respiratory: Effort normal. No respiratory distress.  GI: Soft.  Musculoskeletal:        General: Normal range of motion.     Cervical back: Normal range of motion.  Neurological: She is alert.  Skin: Skin is warm and dry.  Psychiatric: Judgment normal. Her speech is delayed. She is slowed and withdrawn. She exhibits a depressed mood. She expresses suicidal ideation. She expresses no suicidal plans. She exhibits abnormal recent memory.    Review of Systems  Constitutional: Negative.   HENT: Negative.   Eyes: Negative.   Respiratory: Negative.   Cardiovascular: Negative.   Gastrointestinal: Negative.   Musculoskeletal: Negative.   Skin: Negative.   Neurological: Negative.   Psychiatric/Behavioral: Positive for confusion and dysphoric mood.    Blood pressure 134/71, pulse 94, temperature 97.9 F (36.6 C), temperature source Oral, resp. rate 17, height 5\' 4"  (1.626 m), weight  53.5 kg, last menstrual period 10/05/2019, SpO2 100 %.Body mass index is 20.25 kg/m.  General Appearance: Casual  Eye Contact:  Fair  Speech:  Slow  Volume:  Decreased  Mood:  Depressed  Affect:  Appropriate and Congruent  Thought Process:  Coherent and Descriptions of Associations: Intact  Orientation:  Full (Time, Place, and Person)  Thought Content:  Logical  Suicidal Thoughts:  No  Homicidal Thoughts:  No  Memory:  Immediate;   Fair Recent;   Fair Remote;   Fair  Judgement:  Fair  Insight:  Fair  Psychomotor Activity:  Decreased  Concentration:  Concentration: Fair  Recall:  10/07/2019 of Knowledge:  Fair  Language:  Fair  Akathisia:  No  Handed:  Right  AIMS (if indicated):     Assets:  Communication Skills Desire for Improvement Resilience  ADL's:  Impaired  Cognition:  Impaired,  Mild  Sleep:  Number of Hours: 7.25     Treatment Plan Summary: Daily contact with patient to assess and evaluate symptoms and progress in treatment, Medication management and Plan 39 year old woman with depression PTSD and substance abuse.  I reassured her that detoxing from methamphetamine and from the long period of depression she had will inevitably make her feel like she cannot focus.  I do not think we need to add any extra medicine or worry too much about medicine being related to that.  I encouraged her to keep attending groups.  Treatment team will discuss likely having her referred to inpatient substance abuse treatment.  Reviewed notes from above discharge planning is in progress. Will increase Seroquel 200mg  po qhs., and continue morning dose of Seroquel. She is continue the Prozac as directed. In regards to her pain management she is encouraged to maximize use of Tylenol and  Ibuprofen. Will continue Gabapentin 300mg  po TID. Will add meloxicam 7.5mg  po daily for arthritic pain.   , FNP 10/11/2019, 3:04 PM

## 2019-10-11 NOTE — Progress Notes (Signed)
Patient was in her room upon arrival to the unit. Patient was pleasant during assessment denying SI/HI/AVH, pain. Patient endorses anxiety (8/10) and depression (5/10) Patient complaint with medication administration per MD orders. Patient observed interacting appropriately with staff and peers on the unit. Pt given education, encouragement to be active in her treatment plan. Pt being monitored Q 15 minutes for safety per unit protocol. Patient remains safe on the unit.

## 2019-10-11 NOTE — BHH Group Notes (Signed)
LCSW Group Therapy Note  10/11/2019 1:00 PM  Type of Therapy/Topic:  Group Therapy:  Feelings about Diagnosis  Participation Level:  Did Not Attend   Description of Group:   This group will allow patients to explore their thoughts and feelings about diagnoses they have received. Patients will be guided to explore their level of understanding and acceptance of these diagnoses. Facilitator will encourage patients to process their thoughts and feelings about the reactions of others to their diagnosis and will guide patients in identifying ways to discuss their diagnosis with significant others in their lives. This group will be process-oriented, with patients participating in exploration of their own experiences, giving and receiving support, and processing challenge from other group members.   Therapeutic Goals: 1. Patient will demonstrate understanding of diagnosis as evidenced by identifying two or more symptoms of the disorder 2. Patient will be able to express two feelings regarding the diagnosis 3. Patient will demonstrate their ability to communicate their needs through discussion and/or role play  Summary of Patient Progress: X  Therapeutic Modalities:   Cognitive Behavioral Therapy Brief Therapy Feelings Identification   Penni Homans, MSW, LCSW 10/11/2019 11:07 AM

## 2019-10-11 NOTE — Plan of Care (Signed)
Pt endorses anxiety stating it was really high today. Pt given education and medication per MD orders  Problem: Education: Goal: Emotional status will improve Outcome: Not Progressing Goal: Mental status will improve Outcome: Not Progressing

## 2019-10-11 NOTE — Progress Notes (Signed)
Pt has been cooperative and has gone to groups. Pt still has an anxious, flat affect. She seems more irritable this afternoon. Torrie Mayers RN

## 2019-10-11 NOTE — B[facility] Counselor (Signed)
CSW called to follow up with the following:  [facility]:  Pt has been accepted, call back Thursday for bed availability.  [facility]: Pt needs to call and complete a phone interview.  [facility]: Pt is only currently on the waitlist.  Waitlist is 3 1/2 weeks long  Penni Homans, MSW, LCSW 10/11/2019 1:52 PM

## 2019-10-11 NOTE — Plan of Care (Signed)
Pt rates depression and hopelessness 6/10 and anxiety 8/10. Pt was educated on care plan and verbalizes understanding. Pt was encouraged to attend groups. Torrie Mayers RN Problem: Education: Goal: Knowledge of [place] [facility] Education information/materials will improve Outcome: Progressing Goal: Emotional status will improve Outcome: Progressing Goal: Mental status will improve Outcome: Progressing Goal: Verbalization of understanding the information provided will improve Outcome: Progressing   Problem: Safety: Goal: Periods of time without injury will increase Outcome: Progressing   Problem: Physical Regulation: Goal: Complications related to the disease process, condition or treatment will be avoided or minimized Outcome: Progressing   Problem: Self-Concept: Goal: Ability to disclose and discuss suicidal ideas will improve Outcome: Progressing   Problem: Coping: Goal: Coping ability will improve Outcome: Progressing

## 2019-10-11 NOTE — B[facility] Group Notes (Signed)
[facility] Group Notes:  (Nursing/MHT/Case Management/Adjunct)  Date:  10/11/2019  Time: 8:30AM  Type of Therapy:  Community Meeting   Participation Level:  Active  Participation Quality:  Appropriate and Attentive  Affect:  Appropriate  Cognitive:  Appropriate and Oriented  Insight:  Appropriate and Good  Engagement in Group:  Engaged and Supportive  Modes of Intervention:  Discussion and Orientation  Summary of Progress/Problems:  Lori Miller 10/11/2019, 10:40 AM

## 2019-10-12 DIAGNOSIS — F332 Major depressive disorder, recurrent severe without psychotic features: Secondary | ICD-10-CM

## 2019-10-12 MED ORDER — DOCUSATE SODIUM 100 MG PO CAPS: 100.0000 mg | ORAL_CAPSULE | Freq: Two times a day (BID) | ORAL | 0 refills | Status: DC

## 2019-10-12 MED ORDER — MELOXICAM 7.5 MG PO TABS: 7.5000 mg | ORAL_TABLET | Freq: Every day | ORAL | 0 refills | Status: DC

## 2019-10-12 MED ORDER — PANTOPRAZOLE SODIUM 40 MG PO TBEC: 40.0000 mg | DELAYED_RELEASE_TABLET | Freq: Every day | ORAL | 0 refills | Status: DC

## 2019-10-12 MED ORDER — QUETIAPINE FUMARATE 100 MG PO TABS: 100.0000 mg | ORAL_TABLET | Freq: Every day | ORAL | 0 refills | Status: AC

## 2019-10-12 MED ORDER — CEPHALEXIN 500 MG PO CAPS: 500.0000 mg | ORAL_CAPSULE | Freq: Four times a day (QID) | ORAL | 0 refills | Status: AC

## 2019-10-12 MED ORDER — GABAPENTIN 300 MG PO CAPS: 300.0000 mg | ORAL_CAPSULE | Freq: Three times a day (TID) | ORAL | 0 refills | Status: DC

## 2019-10-12 MED ORDER — TRAZODONE HCL 50 MG PO TABS: 50.0000 mg | ORAL_TABLET | Freq: Every evening | ORAL | 0 refills | Status: DC | PRN

## 2019-10-12 MED ORDER — QUETIAPINE FUMARATE 50 MG PO TABS: 50.0000 mg | ORAL_TABLET | Freq: Four times a day (QID) | ORAL | 0 refills | Status: DC | PRN

## 2019-10-12 MED ORDER — FLUOXETINE HCL 20 MG PO CAPS: 20.0000 mg | ORAL_CAPSULE | Freq: Every day | ORAL | 0 refills | Status: DC

## 2019-10-12 NOTE — [facility] Counselor (Signed)
CSW spoke with Marcelline Deist at [facility] who reports beds are full and they are waiting on discharges. She encouraged CSW to check in daily on bed availability.

## 2019-10-12 NOTE — [facility] Group Notes (Signed)
[facility] Group Notes:  (Nursing/MHT/Case Management/Adjunct)  Date:  10/12/2019  Time:  2:54 AM  Type of Therapy:  Group Therapy  Participation Level:  Active  Participation Quality:  Appropriate  Affect:  Appropriate  Cognitive:  Appropriate  Insight:  Appropriate  Engagement in Group:  Supportive  Modes of Intervention:  Support  Summary of Progress/Problems:  Landry Mellow 10/12/2019, 2:54 AM

## 2019-10-12 NOTE — [facility] Counselor (Signed)
CSW spoke with Lori Miller at [facility] who was willing to complete the patient's interview as the patient had changed her mind and was willing to go to the program.  Lori Miller voiced concerns for the pts motivation to change behavior.  CSW went to the unit to provide the patient the contact information to complete the interview.  Patient asked if she would be able to have a peer take her to the program and CSW explained that transportation is facility to facility.  Pt stated that she no longer wanted to go to the program if a peer would not be able to take her.  CSW informed pt that after this [facility] may no longer be an option as the pt has turned them down twice.  Pt stated she understood.  CSW called Lori Miller at [facility] to inform that patient declined the interview a second time.  Lori Miller reports that pt has been removed from the list at this time.  Penni Homans, MSW, LCSW 10/12/2019 10:08 AM

## 2019-10-12 NOTE — [facility] Group Notes (Signed)
[facility] Group Notes:  (Nursing/MHT/Case Management/Adjunct)  Date:  10/12/2019  Time:  9:40 AM  Type of Therapy:  Community Meeting  Participation Level:  Did Not Attend  Summary of Progress/Problems:  Kerrie Pleasure 10/12/2019, 9:40 AM

## 2019-10-12 NOTE — Progress Notes (Signed)
Patient alert and oriented x 4, affect is blunted her mood is assertive and pleasant , she was receptive to staff she denies SI/HI/AVH no distress noted, she was interacting appropriately attended evening wrap up group and tolerated evening medication. 15 minutes safety checks maintained will continue to monitor.

## 2019-10-12 NOTE — Plan of Care (Signed)
Pt rates depression 6/10, 5/10 hopelessness and 7/10 anxiety. Pt was educated on care plan and verbalizes understanding. Pt was encouraged to attend groups. Torrie Mayers RN Problem: Education: Goal: Knowledge of [place] [facility] Education information/materials will improve Outcome: Progressing Goal: Emotional status will improve Outcome: Progressing Goal: Mental status will improve Outcome: Progressing Goal: Verbalization of understanding the information provided will improve Outcome: Progressing   Problem: Safety: Goal: Periods of time without injury will increase Outcome: Progressing   Problem: Physical Regulation: Goal: Complications related to the disease process, condition or treatment will be avoided or minimized Outcome: Progressing   Problem: Self-Concept: Goal: Ability to disclose and discuss suicidal ideas will improve Outcome: Progressing   Problem: Coping: Goal: Coping ability will improve Outcome: Progressing

## 2019-10-12 NOTE — Progress Notes (Signed)
Pt has been withdrawn in her room this afternoon. Torrie Mayers RN

## 2019-10-12 NOTE — Plan of Care (Signed)
  Problem: Self-Concept: Goal: Ability to disclose and discuss suicidal ideas will improve Outcome: Progressing

## 2019-10-12 NOTE — [facility] Group Notes (Signed)
[facility] Group Notes:  (Nursing/MHT/Case Management/Adjunct)  Date:  10/12/2019  Time:  9:16 PM  Type of Therapy:  Group Therapy  Participation Level:  Active  Participation Quality:  Appropriate, Sharing and Supportive  Affect:  Appropriate and Excited  Cognitive:  Alert and Appropriate  Insight:  Appropriate  Engagement in Group:  Engaged and Supportive  Modes of Intervention:  Discussion and Support  Summary of Progress/Problems:PT stated that she was gone be more goal oriented and find new circle of friends.   Landry Mellow 10/12/2019, 9:16 PM

## 2019-10-12 NOTE — Progress Notes (Signed)
[facility] MD Progress Note  10/12/2019 11:31 AM Lori Miller  MRN:  [id number]   Subjective: Follow-up for this 39 year old female with MDD severe recurrent.  Patient reports that she is feeling more depressed today than she was yesterday.  She stated that she felt that yesterday was a good day but for some reason she is just really concerned about where she is going to go after she discharges.  Patient states that she is now again interested in going back to [facility], but then states that she wants to have her own transportation to anywhere that she decides to go.  She requests that I notify CSW to let him know to come and speak with her.  Patient reports that her sleep has been fair she woke up a couple times but was able to go back to sleep.  She denies having any suicidal or homicidal ideations and denies any hallucinations.  She continues stating "I just do not know what I should do."  Principal Problem: Severe recurrent major depression without psychotic features (HCC) Diagnosis: Principal Problem:   Severe recurrent major depression without psychotic features (HCC) Active Problems:   PTSD (post-traumatic stress disorder)   Methamphetamine abuse (HCC)   MDD (major depressive disorder), recurrent severe, without psychosis (HCC)   Urinary tract infection  Total Time spent with patient: 20 minutes  Past Psychiatric History: Prior inpatient treatment last here a year and a half ago. After that hospitalization went to alcohol and drug abuse treatment and says she remained sober for a while but can't say how long. Didn't really follow-up with outpatient treatment. No history of actual suicide attempts although she has thought about cutting herself in the past. Previous diagnoses include OCD and PTSD as well as depression and substance abuse  Past Medical History:  Past Medical History:  Diagnosis Date  . Anxiety   . Anxiety   . Depression   . Depression   . Migraine   . PTSD (post-traumatic  stress disorder)     Past Surgical History:  Procedure Laterality Date  . BUNIONECTOMY    . CYST EXCISION     Family History: History reviewed. No pertinent family history. Family Psychiatric  History: None reported Social History:  Social History   Substance and Sexual Activity  Alcohol Use Yes  . Alcohol/week: 4.0 standard drinks  . Types: 4 Shots of liquor per week     Social History   Substance and Sexual Activity  Drug Use Yes  . Frequency: 6.0 times per week  . Types: Marijuana, Benzodiazepines, Methamphetamines   Comment: marijuana daily; crack daily x 3 months    Social History   Socioeconomic History  . Marital status: Single    Spouse name: Not on file  . Number of children: Not on file  . Years of education: Not on file  . Highest education level: Not on file  Occupational History  . Not on file  Tobacco Use  . Smoking status: Current Every Day Smoker    Packs/day: 1.00    Types: Cigarettes  . Smokeless tobacco: Never Used  Substance and Sexual Activity  . Alcohol use: Yes    Alcohol/week: 4.0 standard drinks    Types: 4 Shots of liquor per week  . Drug use: Yes    Frequency: 6.0 times per week    Types: Marijuana, Benzodiazepines, Methamphetamines    Comment: marijuana daily; crack daily x 3 months  . Sexual activity: Yes  Other Topics Concern  . Not  on file  Social History Narrative  . Not on file   Social Determinants of Health   Financial Resource Strain:   . Difficulty of Paying Living Expenses: Not on file  Food Insecurity:   . Worried About Programme researcher, broadcasting/film/video in the Last Year: Not on file  . Ran Out of Food in the Last Year: Not on file  Transportation Needs:   . Lack of Transportation (Medical): Not on file  . Lack of Transportation (Non-Medical): Not on file  Physical Activity:   . Days of Exercise per Week: Not on file  . Minutes of Exercise per Session: Not on file  Stress:   . Feeling of Stress : Not on file  Social  Connections:   . Frequency of Communication with Friends and Family: Not on file  . Frequency of Social Gatherings with Friends and Family: Not on file  . Attends Religious Services: Not on file  . Active Member of Clubs or Organizations: Not on file  . Attends Banker Meetings: Not on file  . Marital Status: Not on file   Additional Social History:                         Sleep: Fair  Appetite:  Fair  Current Medications: Current Facility-Administered Medications  Medication Dose Route Frequency Provider Last Rate Last Admin  . acetaminophen (TYLENOL) tablet 650 mg  650 mg Oral Q6H PRN Cleola Perryman, Gerlene Burdock, FNP   650 mg at 10/12/19 0630  . alum & mag hydroxide-simeth (MAALOX/MYLANTA) 200-200-20 MG/5ML suspension 30 mL  30 mL Oral Q4H PRN Azeez Dunker, Feliz Beam B, FNP   30 mL at 10/11/19 1702  . cephALEXin (KEFLEX) capsule 500 mg  500 mg Oral Q6H Clapacs, John T, MD   500 mg at 10/12/19 0630  . docusate sodium (COLACE) capsule 100 mg  100 mg Oral BID Clapacs, Jackquline Denmark, MD   100 mg at 10/12/19 0815  . FLUoxetine (PROZAC) capsule 20 mg  20 mg Oral Daily Clapacs, Jackquline Denmark, MD   20 mg at 10/12/19 0815  . gabapentin (NEURONTIN) capsule 300 mg  300 mg Oral TID Tressie Ellis, RPH   300 mg at 10/12/19 0815  . hydrOXYzine (ATARAX/VISTARIL) tablet 50 mg  50 mg Oral TID PRN Clapacs, Jackquline Denmark, MD   50 mg at 10/12/19 0630  . ibuprofen (ADVIL) tablet 600 mg  600 mg Oral Q6H PRN Clapacs, Jackquline Denmark, MD   600 mg at 10/09/19 1726  . magnesium hydroxide (MILK OF MAGNESIA) suspension 30 mL  30 mL Oral Daily PRN Deborrah Mabin, Feliz Beam B, FNP   30 mL at 10/08/19 1243  . meloxicam (MOBIC) tablet 7.5 mg  7.5 mg Oral Daily Maryagnes Amos, FNP   7.5 mg at 10/12/19 0816  . ondansetron (ZOFRAN) tablet 4 mg  4 mg Oral Q8H PRN Clapacs, Jackquline Denmark, MD   4 mg at 10/11/19 1213  . pantoprazole (PROTONIX) EC tablet 40 mg  40 mg Oral Daily Clapacs, Jackquline Denmark, MD   40 mg at 10/12/19 0815  . QUEtiapine (SEROQUEL) tablet 100 mg   100 mg Oral QHS Clapacs, John T, MD   100 mg at 10/11/19 2134  . QUEtiapine (SEROQUEL) tablet 50 mg  50 mg Oral Q6H PRN Clapacs, Jackquline Denmark, MD   50 mg at 10/12/19 0816  . traZODone (DESYREL) tablet 50 mg  50 mg Oral QHS PRN Jakaylee Sasaki, Gerlene Burdock, FNP   50  mg at 10/11/19 2134    Lab Results: No results found for this or any previous visit (from the past 48 hour(s)).  Blood Alcohol level:  Lab Results  Component Value Date   ETH <10 10/06/2019   ETH <10 16/05/9603    Metabolic Disorder Labs: Lab Results  Component Value Date   HGBA1C 5.3 03/09/2018   MPG 105.41 03/09/2018   MPG 111 02/27/2017   No results found for: PROLACTIN Lab Results  Component Value Date   CHOL 156 03/09/2018   TRIG 106 03/09/2018   HDL 57 03/09/2018   CHOLHDL 2.7 03/09/2018   VLDL 21 03/09/2018   LDLCALC 78 03/09/2018   LDLCALC 100 (H) 02/27/2017    Physical Findings: AIMS:  , ,  ,  ,    CIWA:    COWS:     Musculoskeletal: Strength & Muscle Tone: within normal limits Gait & Station: normal Patient leans: N/A  Psychiatric Specialty Exam: Physical Exam  Nursing note and vitals reviewed. Constitutional: She is oriented to person, place, and time. She appears well-developed and well-nourished.  Cardiovascular: Normal rate.  Respiratory: Effort normal.  Musculoskeletal:        General: Normal range of motion.  Neurological: She is alert and oriented to person, place, and time.  Skin: Skin is warm.    Review of Systems  Constitutional: Negative.   HENT: Negative.   Eyes: Negative.   Respiratory: Negative.   Cardiovascular: Negative.   Gastrointestinal: Negative.   Genitourinary: Negative.   Musculoskeletal: Negative.   Skin: Negative.   Neurological: Negative.   Psychiatric/Behavioral: Positive for dysphoric mood. The patient is nervous/anxious.     Blood pressure 121/72, pulse 87, temperature 98.9 F (37.2 C), temperature source Oral, resp. rate 17, height 5\' 4"  (1.626 m), weight 53.5 kg,  last menstrual period 10/05/2019, SpO2 100 %.Body mass index is 20.25 kg/m.  General Appearance: Disheveled  Eye Contact:  Minimal  Speech:  Clear and Coherent and Normal Rate  Volume:  Decreased  Mood:  Depressed  Affect:  Depressed  Thought Process:  Coherent and Descriptions of Associations: Intact  Orientation:  Full (Time, Place, and Person)  Thought Content:  WDL  Suicidal Thoughts:  No  Homicidal Thoughts:  No  Memory:  Immediate;   Fair Recent;   Fair Remote;   Fair  Judgement:  Fair  Insight:  Lacking  Psychomotor Activity:  Normal  Concentration:  Concentration: Fair  Recall:  Good  Fund of Knowledge:  Fair  Language:  Fair  Akathisia:  No  Handed:  Right  AIMS (if indicated):     Assets:  Communication Skills Desire for Improvement Resilience Social Support  ADL's:  Intact  Cognition:  WNL  Sleep:  Number of Hours: 7.5   Assessment: Patient presents in her room lying in the bed but is open awakened easily.  Patient is pleasant, calm, cooperative.  Patient appears to be very depressed.  She seems to have some confusion about what she needs to do when she is discharged from the hospital.  She seems to be focused more on who she gets to see and be with when she discharges then going to treatment.  Social work was notified of the patient requesting her own transportation to go to residential treatment and I was informed by social work that the patient again refused [facility] because it was the facility to facility transfer and she wanted her own transportation.  Patient is reporting some improvement with pain and feel  that patient is more having issues with discharge planning at this time.  Treatment Plan Summary: Daily contact with patient to assess and evaluate symptoms and progress in treatment and Medication management  Continue Prozac 20 mg p.o. daily for depression Continue Neurontin 300 mg p.o. 3 times daily for mood stability and pain Continue Vistaril 50 mg p.o. 3  times daily as needed for anxiety Continue meloxicam 7.5 mg p.o. daily for arthritic pain Continue Seroquel 100 mg p.o. nightly for depression Continue Seroquel 50 mg p.o. every 6 hours as needed for anxiety Continue trazodone 50 mg p.o. nightly as needed for insomnia Encourage group therapy participation Continue every 15 minute safety checks CSW to continue assisting patient with discharge planning  Maryfrances Bunnell, FNP 10/12/2019, 11:31 AM

## 2019-10-12 NOTE — Progress Notes (Signed)
Recreation Therapy Notes   Date: 10/12/2019  Time: 9:30 am   Location: Craft room   Behavioral response: N/A   Intervention Topic: Goals     Discussion/Intervention: Patient did not attend group.   Clinical Observations/Feedback:  Patient did not attend group.   Gursimran Litaker LRT/CTRS        Monigue Spraggins 10/12/2019 11:51 AM

## 2019-10-12 NOTE — [facility] Group Notes (Signed)
Emotional Regulation 10/12/2019 1PM  Type of Therapy/Topic:  Group Therapy:  Emotion Regulation  Participation Level:  Active   Description of Group:   The purpose of this group is to assist patients in learning to regulate negative emotions and experience positive emotions. Patients will be guided to discuss ways in which they have been vulnerable to their negative emotions. These vulnerabilities will be juxtaposed with experiences of positive emotions or situations, and patients will be challenged to use positive emotions to combat negative ones. Special emphasis will be placed on coping with negative emotions in conflict situations, and patients will process healthy conflict resolution skills.  Therapeutic Goals: 1. Patient will identify two positive emotions or experiences to reflect on in order to balance out negative emotions 2. Patient will label two or more emotions that they find the most difficult to experience 3. Patient will demonstrate positive conflict resolution skills through discussion and/or role plays  Summary of Patient Progress: Actively and appropriately participated in group session. Patient identified drug use and an unhealthy relationship with her boyfriend as her triggers. Patient demonstrated good insight as she was able to identify consequences she has received for reacting to a situation negatively. Patient discussed jail time and getting into physical altercations with others. Patient interacted appropriately with group members and respected boundaries during session.   Therapeutic Modalities:   Cognitive Behavioral Therapy Feelings Identification Dialectical Behavioral Therapy   Suzan Slick, LCSW 10/12/2019 2:16 PM

## 2019-10-13 MED ORDER — TRAZODONE HCL 50 MG PO TABS: 50.0000 mg | ORAL_TABLET | Freq: Every evening | ORAL | 1 refills | Status: AC | PRN

## 2019-10-13 MED ORDER — QUETIAPINE FUMARATE 50 MG PO TABS: 50.0000 mg | ORAL_TABLET | Freq: Four times a day (QID) | ORAL | 1 refills | Status: AC | PRN

## 2019-10-13 MED ORDER — MELOXICAM 7.5 MG PO TABS: 7.5000 mg | ORAL_TABLET | Freq: Every day | ORAL | 1 refills | Status: AC

## 2019-10-13 MED ORDER — DOCUSATE SODIUM 100 MG PO CAPS: 100.0000 mg | ORAL_CAPSULE | Freq: Two times a day (BID) | ORAL | 1 refills | Status: AC

## 2019-10-13 MED ORDER — GABAPENTIN 300 MG PO CAPS: 300.0000 mg | ORAL_CAPSULE | Freq: Three times a day (TID) | ORAL | 1 refills | Status: AC

## 2019-10-13 MED ORDER — FLUOXETINE HCL 20 MG PO CAPS: 20.0000 mg | ORAL_CAPSULE | Freq: Every day | ORAL | 1 refills | Status: AC

## 2019-10-13 MED ORDER — PANTOPRAZOLE SODIUM 40 MG PO TBEC: 40.0000 mg | DELAYED_RELEASE_TABLET | Freq: Every day | ORAL | 1 refills | Status: AC

## 2019-10-13 NOTE — Progress Notes (Signed)
Recreation Therapy Notes  Date: 10/13/2019  Time: 9:30 am  Location: Craft Room  Behavioral response: Appropriate   Intervention Topic: Coping Skills   Discussion/Intervention:  Group content on today was focused on coping skills. The group defined what coping skills are and when they can be used. Individuals described how they normally cope with thing and the coping skills they normally use. Patients expressed why it is important to cope with things and how not coping with things can affect you. The group participated in the intervention "My coping box" and made coping boxes while adding coping skills they could use in the future to the box. Clinical Observations/Feedback:  Patient came to group and explained that she copes with things by exercising, listening to music and taking a walk. She expressed that coping skills are important to keep you sane.  Individual was social with peers and staff while participating in the intervention during group.  Eevee Borbon LRT/CTRS         Jeremias Broyhill 10/13/2019 11:20 AM

## 2019-10-13 NOTE — Progress Notes (Signed)
Pt denies SI, Hi and AVS. Pt received prescriptions, belongings and dc packet. Pt was educated on dc plan and verbalizes understanding. Torrie Mayers RN

## 2019-10-13 NOTE — [facility] Group Notes (Signed)
[facility] Group Notes:  (Nursing/MHT/Case Management/Adjunct)  Date:  10/13/2019  Time: 8:30AM Type of Therapy:  Community meeting   Participation Level:  Active  Participation Quality:  Appropriate and Attentive  Affect:  Appropriate  Cognitive:  Alert and Appropriate  Insight:  Appropriate and Good  Engagement in Group:  Engaged and Supportive  Modes of Intervention:  Discussion and Orientation  Summary of Progress/Problems:  Lori Miller 10/13/2019, 9:56 AM

## 2019-10-13 NOTE — Discharge Summary (Signed)
Physician Discharge Summary Note  Patient:  Lori Miller is an 39 y.o., female MRN:  [id number] DOB:  [birth year] Patient phone:  [phone] (home)  Patient address:   [place] [address],  Total Time spent with patient: 30 minutes  Date of Admission:  10/07/2019 Date of Discharge: 10/13/19  Reason for Admission:  39 year old woman with a history of substance abuse and mood problems came to the emergency room voluntarily seeking help for depression anxiety and drug abuse. Her chief complaint to me is "I'm coming off drugs and I feel terrible". Patient says she and her boyfriend have been using methamphetamine regularly for months now. They have been living in a car. Her mood has been depressed anxious and feeling out of control and hopeless. She has been having thoughts about wanting to hurt herself but does not have a specific plan. She talks about seeing things out of the corner of her eyes and having paranoid thoughts but doesn't present with primary delusions. Patient has not been receiving any mental health care in quite a while perhaps as much as a year. Not currently on any psychiatric medicine. Denies using any drugs other than the methamphetamine.  Principal Problem: Severe recurrent major depression without psychotic features [facility]) Discharge Diagnoses: Principal Problem:   Severe recurrent major depression without psychotic features ([place]) Active Problems:   PTSD (post-traumatic stress disorder)   Methamphetamine abuse ([place])   MDD (major depressive disorder), recurrent severe, without psychosis ([place])   Urinary tract infection   Past Psychiatric History: Prior inpatient treatment last here a year and a half ago. After that hospitalization went to alcohol and drug abuse treatment and says she remained sober for a while but can't say how long. Didn't really follow-up with outpatient treatment. No history of actual suicide attempts although she has thought  about cutting herself in the past. Previous diagnoses include OCD and PTSD as well as depression and substance abuse  Past Medical History:  Past Medical History:  Diagnosis Date  . Anxiety   . Anxiety   . Depression   . Depression   . Migraine   . PTSD (post-traumatic stress disorder)     Past Surgical History:  Procedure Laterality Date  . BUNIONECTOMY    . CYST EXCISION     Family History: History reviewed. No pertinent family history. Family Psychiatric  History: None reported Social History:  Social History   Substance and Sexual Activity  Alcohol Use Yes  . Alcohol/week: 4.0 standard drinks  . Types: 4 Shots of liquor per week     Social History   Substance and Sexual Activity  Drug Use Yes  . Frequency: 6.0 times per week  . Types: Marijuana, Benzodiazepines, Methamphetamines   Comment: marijuana daily; crack daily x 3 months    Social History   Socioeconomic History  . Marital status: Single    Spouse name: Not on file  . Number of children: Not on file  . Years of education: Not on file  . Highest education level: Not on file  Occupational History  . Not on file  Tobacco Use  . Smoking status: Current Every Day Smoker    Packs/day: 1.00    Types: Cigarettes  . Smokeless tobacco: Never Used  Substance and Sexual Activity  . Alcohol use: Yes    Alcohol/week: 4.0 standard drinks    Types: 4 Shots of liquor per week  . Drug use: Yes    Frequency: 6.0 times per  week    Types: Marijuana, Benzodiazepines, Methamphetamines    Comment: marijuana daily; crack daily x 3 months  . Sexual activity: Yes  Other Topics Concern  . Not on file  Social History Narrative  . Not on file   Social Determinants of Health   Financial Resource Strain:   . Difficulty of Paying Living Expenses: Not on file  Food Insecurity:   . Worried About Programme researcher, broadcasting/film/video in the Last Year: Not on file  . Ran Out of Food in the Last Year: Not on file  Transportation Needs:    . Lack of Transportation (Medical): Not on file  . Lack of Transportation (Non-Medical): Not on file  Physical Activity:   . Days of Exercise per Week: Not on file  . Minutes of Exercise per Session: Not on file  Stress:   . Feeling of Stress : Not on file  Social Connections:   . Frequency of Communication with Friends and Family: Not on file  . Frequency of Social Gatherings with Friends and Family: Not on file  . Attends Religious Services: Not on file  . Active Member of Clubs or Organizations: Not on file  . Attends Banker Meetings: Not on file  . Marital Status: Not on file    Hospital Course:  Patient remained on the [facility] unit for 5 days. The patient stabilized on medication and therapy. Patient was discharged on Keflex 500 mg every 6 hours for 7 days, Colace 100 mg twice daily, Prozac 20 mg daily, Neurontin 300 mg 3 times daily, Vistaril 50 mg 3 times daily as needed, meloxicam 7.5 mg daily, Protonix 40 mg daily, Seroquel 100 mg nightly and 50 mg every 6 hours as needed, and trazodone 50 mg nightly as needed. Patient has shown improvement with improved mood, affect, sleep, appetite, and interaction. Patient has attended group and participated. Patient has been seen in the day room interacting with peers and staff appropriately. Patient denies any SI/HI/AVH and contracts for safety. Patient agrees to follow up at [facility], [facility], or [facility]. Patient is provided with prescriptions for their medications upon discharge.  Also did note patient was offered numerous options for going to residential rehabilitation treatment inpatient ended up refusing these options prior to discharge.  Physical Findings: AIMS:  , ,  ,  ,    CIWA:    COWS:     Musculoskeletal: Strength & Muscle Tone: within normal limits Gait & Station: normal Patient leans: N/A  Psychiatric Specialty Exam: Physical Exam  Nursing note and vitals reviewed. Constitutional: She is oriented to person, place,  and time. She appears well-developed and well-nourished.  Cardiovascular: Normal rate.  Respiratory: Effort normal.  Musculoskeletal:        General: Normal range of motion.  Neurological: She is alert and oriented to person, place, and time.  Skin: Skin is warm.    Review of Systems  Constitutional: Negative.   HENT: Negative.   Eyes: Negative.   Respiratory: Negative.   Cardiovascular: Negative.   Gastrointestinal: Negative.   Genitourinary: Negative.   Musculoskeletal: Negative.   Skin: Negative.   Neurological: Negative.   Psychiatric/Behavioral: Negative.     Blood pressure 114/77, pulse 87, temperature 98.6 F (37 C), temperature source Oral, resp. rate 17, height 5\' 4"  (1.626 m), weight 53.5 kg, last menstrual period 10/05/2019, SpO2 100 %.Body mass index is 20.25 kg/m.  General Appearance: Casual  Eye Contact:  Good  Speech:  Clear and Coherent and Normal  Rate  Volume:  Normal  Mood:  Euthymic  Affect:  Congruent  Thought Process:  Coherent and Descriptions of Associations: Intact  Orientation:  Full (Time, Place, and Person)  Thought Content:  WDL  Suicidal Thoughts:  No  Homicidal Thoughts:  No  Memory:  Immediate;   Fair Recent;   Fair Remote;   Fair  Judgement:  Fair  Insight:  Fair  Psychomotor Activity:  Normal  Concentration:  Concentration: Fair  Recall:  Fair  Fund of Knowledge:  Fair  Language:  Fair  Akathisia:  Yes  Handed:  Right  AIMS (if indicated):     Assets:  Desire for Improvement Housing Resilience Social Support  ADL's:  Intact  Cognition:  WNL  Sleep:  Number of Hours: 8     Have you used any form of tobacco in the last 30 days? (Cigarettes, Smokeless Tobacco, Cigars, and/or Pipes): Yes  Has this patient used any form of tobacco in the last 30 days? (Cigarettes, Smokeless Tobacco, Cigars, and/or Pipes) Yes, Yes, A prescription for an FDA-approved tobacco cessation medication was offered at discharge and the patient  refused  Blood Alcohol level:  Lab Results  Component Value Date   [facility] <10 10/06/2019   ETH <10 03/07/2018    Metabolic Disorder Labs:  Lab Results  Component Value Date   HGBA1C 5.3 03/09/2018   MPG 105.41 03/09/2018   MPG 111 02/27/2017   No results found for: PROLACTIN Lab Results  Component Value Date   CHOL 156 03/09/2018   TRIG 106 03/09/2018   HDL 57 03/09/2018   CHOLHDL 2.7 03/09/2018   VLDL 21 03/09/2018   LDLCALC 78 03/09/2018   LDLCALC 100 (H) 02/27/2017    See Psychiatric Specialty Exam and Suicide Risk Assessment completed by Attending Physician prior to discharge.  Discharge destination:  Home  Is patient on multiple antipsychotic therapies at discharge:  No   Has Patient had three or more failed trials of antipsychotic monotherapy by history:  No  Recommended Plan for Multiple Antipsychotic Therapies: NA  Discharge Instructions    Diet - low sodium heart healthy   Complete by: As directed    Increase activity slowly   Complete by: As directed      Allergies as of 10/13/2019      Reactions   Flagyl [metronidazole] Other (See Comments)   Reaction:  Abdominal pain      Medication List    STOP taking these medications   doxycycline 100 MG tablet Commonly known as: VIBRA-TABS   fluvoxaMINE 100 MG tablet Commonly known as: LUVOX     TAKE these medications     Indication  cephALEXin 500 MG capsule Commonly known as: KEFLEX Take 1 capsule (500 mg total) by mouth every 6 (six) hours. What changed: when to take this  Indication: Infection of Genitals and/or Urinary Tract   docusate sodium 100 MG capsule Commonly known as: COLACE Take 1 capsule (100 mg total) by mouth 2 (two) times daily.  Indication: Constipation   FLUoxetine 20 MG capsule Commonly known as: PROZAC Take 1 capsule (20 mg total) by mouth daily.  Indication: Depression   gabapentin 300 MG capsule Commonly known as: NEURONTIN Take 1 capsule (300 mg total) by mouth 3  (three) times daily.  Indication: Abuse or Misuse of Alcohol   meloxicam 7.5 MG tablet Commonly known as: MOBIC Take 1 tablet (7.5 mg total) by mouth daily.  Indication: Joint Damage causing Pain and Loss of Function  pantoprazole 40 MG tablet Commonly known as: PROTONIX Take 1 tablet (40 mg total) by mouth daily.  Indication: Gastroesophageal Reflux Disease   QUEtiapine 100 MG tablet Commonly known as: SEROQUEL Take 1 tablet (100 mg total) by mouth at bedtime. What changed:   medication strength  how much to take  when to take this  Indication: Depressive Phase of Manic-Depression, Major Depressive Disorder   QUEtiapine 50 MG tablet Commonly known as: SEROQUEL Take 1 tablet (50 mg total) by mouth every 6 (six) hours as needed (anxiety). What changed:   medication strength  how much to take  when to take this  reasons to take this  Indication: Depressive Phase of Manic-Depression, Major Depressive Disorder   traZODone 50 MG tablet Commonly known as: DESYREL Take 1 tablet (50 mg total) by mouth at bedtime as needed for sleep.  Indication: Trouble Sleeping      Follow-up Information    [facility] Follow up.   Contact information: [address] [place] [address] [phone]        [facility] Follow up.   Specialty: Addiction Medicine Contact information: [address] [place] [address] [phone]           Follow-up recommendations:  Continue activity as tolerated. Continue diet as recommended by your PCP. Ensure to keep all appointments with outpatient providers.  Comments:  Patient is instructed prior to discharge to: Take all medications as prescribed by his/her mental healthcare provider. Report any adverse effects and or reactions from the medicines to his/her outpatient provider promptly. Patient has been instructed & cautioned: To not engage in alcohol and or  illegal drug use while on prescription medicines. In the event of worsening symptoms, patient is instructed to call the crisis hotline, 911 and or go to the nearest ED for appropriate evaluation and treatment of symptoms. To follow-up with his/her primary care provider for your other medical issues, concerns and or health care needs.    Signed: Gerlene Burdock Safia Panzer, FNP 10/13/2019, 9:46 AM

## 2019-10-13 NOTE — BHH Suicide Risk Assessment (Signed)
[facility] Discharge Suicide Risk Assessment   Principal Problem: Severe recurrent major depression without psychotic features [facility]) Discharge Diagnoses: Principal Problem:   Severe recurrent major depression without psychotic features ([facility]) Active Problems:   PTSD (post-traumatic stress disorder)   Methamphetamine abuse ([facility])   MDD (major depressive disorder), recurrent severe, without psychosis ([facility])   Urinary tract infection   Total Time spent with patient: 30 minutes  Musculoskeletal: Strength & Muscle Tone: within normal limits Gait & Station: normal Patient leans: N/A  Psychiatric Specialty Exam: Review of Systems  Constitutional: Negative.   HENT: Negative.   Eyes: Negative.   Respiratory: Negative.   Cardiovascular: Negative.   Gastrointestinal: Negative.   Musculoskeletal: Negative.   Skin: Negative.   Neurological: Negative.   Psychiatric/Behavioral: Negative.     Blood pressure 114/77, pulse 87, temperature 98.6 F (37 C), temperature source Oral, resp. rate 17, height 5\' 4"  (1.626 m), weight 53.5 kg, last menstrual period 10/05/2019, SpO2 100 %.Body mass index is 20.25 kg/m.  General Appearance: Casual  Eye Contact::  Good  Speech:  Clear and Coherent409  Volume:  Normal  Mood:  Euthymic  Affect:  Congruent  Thought Process:  Goal Directed  Orientation:  Full (Time, Place, and Person)  Thought Content:  Logical  Suicidal Thoughts:  No  Homicidal Thoughts:  No  Memory:  Immediate;   Fair Recent;   Fair Remote;   Fair  Judgement:  Fair  Insight:  Fair  Psychomotor Activity:  Normal  Concentration:  Fair  Recall:  [id number] of Knowledge:Fair  Language: Fair  Akathisia:  Yes  Handed:  Right  AIMS (if indicated):     Assets:  Desire for Improvement Housing Resilience  Sleep:  Number of Hours: 8  Cognition: WNL  ADL's:  Intact   Mental Status Per Nursing Assessment::   On Admission:  Suicidal ideation indicated by patient  Demographic Factors:   Caucasian  Loss Factors: NA  Historical Factors: Impulsivity  Risk Reduction Factors:   Sense of responsibility to family, Living with another person, especially a relative, Positive social support and Positive therapeutic relationship  Continued Clinical Symptoms:  Depression:   Impulsivity Alcohol/Substance Abuse/Dependencies  Cognitive Features That Contribute To Risk:  None    Suicide Risk:  Minimal: No identifiable suicidal ideation.  Patients presenting with no risk factors but with morbid ruminations; may be classified as minimal risk based on the severity of the depressive symptoms  Follow-up Information    [facility] Follow up.   Why: You are currently on the waitlist.  Call daily to check for bed availability.  May have bed availability next week. Contact information: [address] [place] [address]        [facility] Follow up.   Specialty: Addiction Medicine Why: Currently on the waitlist.  Call daily to check on bed availability. Thanks! Contact information: [address] [place] [phone]           Plan Of Care/Follow-up recommendations:  Activity:  Activity as tolerated Diet:  Regular diet Other:  Follow-up with outpatient treatment for substance abuse  [phone], MD 10/13/2019, 10:57 AM

## 2019-10-13 NOTE — Progress Notes (Signed)
Recreation Therapy Notes  INPATIENT RECREATION TR PLAN  Patient Details Name: Lori Miller MRN: [id number] DOB: [birth year] Today's Date: 10/13/2019  Rec Therapy Plan Is patient appropriate for Therapeutic Recreation?: Yes Treatment times per week: at least 3 Estimated Length of Stay: 5-7 days TR Treatment/Interventions: Group participation (Comment)  Discharge Criteria Pt will be discharged from therapy if:: Discharged Treatment plan/goals/alternatives discussed and agreed upon by:: Patient/family  Discharge Summary Short term goals set: Patient will engage in groups without prompting or encouragement from LRT x3 group sessions within 5 recreation therapy group sessions Short term goals met: Complete Progress toward goals comments: Groups attended Which groups?: Coping skills, Stress management Reason goals not met: N/A Therapeutic equipment acquired: N/A Reason patient discharged from therapy: Discharge from hospital Pt/family agrees with progress & goals achieved: Yes Date patient discharged from therapy: 10/13/19   Lori Miller 10/13/2019, 4:18 PM

## 2019-10-13 NOTE — Progress Notes (Signed)
Patient alert and oriented x 4, affect is blunted her mood is assertive and she has insight in her treatment plan, she is aware of discharge tomorrow and she is excited, she is receptive to staff she denies SI/HI/AVH no distress noted, she was interacting appropriately attended evening wrap up group and tolerated evening medication. 15 minutes safety checks maintained will continue to monitor.

## 2019-10-13 NOTE — BHH Counselor (Signed)
CSW called to check on bed status.  CSW spoke with Lori Miller.  CSW was informed that female beds are still full.  Pt needs to call back tomorrow and may have a bed next week.  Penni Homans, MSW, LCSW 10/13/2019 10:13 AM

## 2019-10-13 NOTE — BHH Counselor (Signed)
CSW called [place] at [facility], left confidential vm to check for bed availability, awaiting response.

## 2019-10-13 NOTE — Progress Notes (Signed)
  [facility] Adult Case Management Discharge Plan :  Will you be returning to the same living situation after discharge:  Yes,  pt reports that she is returning to her living situation with her boyfriend. At discharge, do you have transportation home?: Yes,  pt reports that boyfriend will pick her up.  Do you have the ability to pay for your medications: No.  Release of information consent forms completed and in the chart;  Patient's signature needed at discharge.  Patient to Follow up at: Follow-up Information    [facility] Follow up.   Contact information: [address] [place] [address] [phone]  You are currently on the waitlist.  Call 10/14/2019 to see if any beds available.  May have bed availability next week.        [facility] Follow up.   Specialty: Addiction Medicine Contact information: [address] [place] [address] [phone] You are currently on the waitlist.  Call back for bed availability.          Next level of care provider has access to [facility] Link:no  Safety Planning and Suicide Prevention discussed: No.  Have you used any form of tobacco in the last 30 days? (Cigarettes, Smokeless Tobacco, Cigars, and/or Pipes): Yes  Has patient been referred to the Quitline?: Patient refused referral  Patient has been referred for addiction treatment: Pt. refused referral  Harden Mo, LCSW 10/13/2019, 10:13 AM
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
## 2019-10-13 NOTE — BHH Counselor (Signed)
CSW team met with the pt to discuss discharge plan as she is scheduled for discharge today. Pt reports if there is not a bed available today at [facility] she will contact them daily for bed availability when she returns home.

## 2019-10-13 NOTE — Plan of Care (Signed)
  Problem: Group Participation Goal: STG - Patient will engage in groups without prompting or encouragement from LRT x3 group sessions within 5 recreation therapy group sessions Description: STG - Patient will engage in groups without prompting or encouragement from LRT x3 group sessions within 5 recreation therapy group sessions Outcome: Completed/Met

## 2019-10-28 ENCOUNTER — Other Ambulatory Visit: Payer: Self-pay | Admitting: Psychiatry
